# Patient Record
Sex: Male | Born: 1935
Health system: Southern US, Community
[De-identification: ages and names within clinical notes are randomized; demographics above are authoritative.]

## PROBLEM LIST (undated history)

## (undated) ENCOUNTER — Emergency Department: Admission: EM | Discharge status: Absent without leave | Payer: Self-pay

## (undated) DIAGNOSIS — B029 Zoster without complications: Secondary | ICD-10-CM

## (undated) DIAGNOSIS — E785 Hyperlipidemia, unspecified: Secondary | ICD-10-CM

## (undated) DIAGNOSIS — E079 Disorder of thyroid, unspecified: Secondary | ICD-10-CM

## (undated) DIAGNOSIS — C801 Malignant (primary) neoplasm, unspecified: Secondary | ICD-10-CM

## (undated) DIAGNOSIS — I10 Essential (primary) hypertension: Secondary | ICD-10-CM

## (undated) HISTORY — DX: Essential (primary) hypertension: I10

## (undated) HISTORY — DX: Disorder of thyroid, unspecified: E07.9

## (undated) HISTORY — DX: Malignant (primary) neoplasm, unspecified: C80.1

## (undated) HISTORY — DX: Hyperlipidemia, unspecified: E78.5

## (undated) HISTORY — DX: Zoster without complications: B02.9

---

## 1992-12-07 HISTORY — PX: OTHER SURGICAL HISTORY: SHX169

## 2005-01-09 ENCOUNTER — Ambulatory Visit: Payer: Self-pay | Admitting: Gastroenterology

## 2005-07-17 ENCOUNTER — Ambulatory Visit: Payer: Self-pay | Admitting: Gastroenterology

## 2005-08-24 ENCOUNTER — Other Ambulatory Visit: Payer: Self-pay

## 2005-08-24 ENCOUNTER — Ambulatory Visit: Payer: Self-pay | Admitting: General Surgery

## 2005-08-24 HISTORY — PX: OTHER SURGICAL HISTORY: SHX169

## 2005-08-26 ENCOUNTER — Inpatient Hospital Stay: Payer: Self-pay | Admitting: General Surgery

## 2007-09-08 ENCOUNTER — Ambulatory Visit: Payer: Self-pay | Admitting: Internal Medicine

## 2007-09-12 ENCOUNTER — Ambulatory Visit: Payer: Self-pay | Admitting: Internal Medicine

## 2008-10-08 ENCOUNTER — Ambulatory Visit: Payer: Self-pay | Admitting: Unknown Physician Specialty

## 2009-01-25 ENCOUNTER — Ambulatory Visit: Payer: Self-pay | Admitting: Internal Medicine

## 2009-07-15 ENCOUNTER — Ambulatory Visit: Payer: Self-pay | Admitting: Unknown Physician Specialty

## 2009-08-13 ENCOUNTER — Ambulatory Visit: Payer: Self-pay | Admitting: Internal Medicine

## 2013-02-08 ENCOUNTER — Ambulatory Visit: Payer: Self-pay | Admitting: Ophthalmology

## 2013-04-19 ENCOUNTER — Ambulatory Visit: Payer: Self-pay | Admitting: Ophthalmology

## 2014-03-20 DIAGNOSIS — IMO0002 Reserved for concepts with insufficient information to code with codable children: Secondary | ICD-10-CM | POA: Insufficient documentation

## 2014-03-20 HISTORY — DX: Reserved for concepts with insufficient information to code with codable children: IMO0002

## 2014-09-12 DIAGNOSIS — G903 Multi-system degeneration of the autonomic nervous system: Secondary | ICD-10-CM | POA: Insufficient documentation

## 2014-09-12 HISTORY — DX: Multi-system degeneration of the autonomic nervous system: G90.3

## 2015-03-18 LAB — BASIC METABOLIC PANEL
Creatinine: 1.3 mg/dL (ref 0.6–1.3)
POTASSIUM: 3.8 mmol/L (ref 3.4–5.3)

## 2015-03-18 LAB — TSH: TSH: 3.13 u[IU]/mL (ref 0.41–5.90)

## 2015-03-18 LAB — HEPATIC FUNCTION PANEL
ALT: 11 U/L (ref 10–40)
AST: 14 U/L (ref 14–40)

## 2015-09-11 ENCOUNTER — Encounter: Payer: Self-pay | Admitting: Family Medicine

## 2015-09-11 ENCOUNTER — Ambulatory Visit (INDEPENDENT_AMBULATORY_CARE_PROVIDER_SITE_OTHER): Payer: Medicare Other | Admitting: Family Medicine

## 2015-09-11 VITALS — BP 139/67 | HR 72 | Ht 65.0 in | Wt 145.0 lb

## 2015-09-11 DIAGNOSIS — E785 Hyperlipidemia, unspecified: Secondary | ICD-10-CM

## 2015-09-11 DIAGNOSIS — I129 Hypertensive chronic kidney disease with stage 1 through stage 4 chronic kidney disease, or unspecified chronic kidney disease: Secondary | ICD-10-CM

## 2015-09-11 DIAGNOSIS — E039 Hypothyroidism, unspecified: Secondary | ICD-10-CM

## 2015-09-11 DIAGNOSIS — I493 Ventricular premature depolarization: Secondary | ICD-10-CM | POA: Diagnosis not present

## 2015-09-11 DIAGNOSIS — I10 Essential (primary) hypertension: Secondary | ICD-10-CM | POA: Diagnosis not present

## 2015-09-11 DIAGNOSIS — N183 Chronic kidney disease, stage 3 unspecified: Secondary | ICD-10-CM

## 2015-09-11 DIAGNOSIS — G459 Transient cerebral ischemic attack, unspecified: Secondary | ICD-10-CM

## 2015-09-11 DIAGNOSIS — Z8673 Personal history of transient ischemic attack (TIA), and cerebral infarction without residual deficits: Secondary | ICD-10-CM | POA: Insufficient documentation

## 2015-09-11 HISTORY — DX: Hypertensive chronic kidney disease with stage 1 through stage 4 chronic kidney disease, or unspecified chronic kidney disease: I12.9

## 2015-09-11 HISTORY — DX: Personal history of transient ischemic attack (TIA), and cerebral infarction without residual deficits: Z86.73

## 2015-09-11 HISTORY — DX: Ventricular premature depolarization: I49.3

## 2015-09-11 HISTORY — DX: Hypothyroidism, unspecified: E03.9

## 2015-09-11 LAB — BASIC METABOLIC PANEL
BUN: 22 mg/dL (ref 7–25)
CHLORIDE: 99 mmol/L (ref 98–110)
CO2: 26 mmol/L (ref 20–31)
CREATININE: 1.43 mg/dL — AB (ref 0.70–1.11)
Calcium: 9.3 mg/dL (ref 8.6–10.3)
Glucose, Bld: 92 mg/dL (ref 65–99)
Potassium: 4 mmol/L (ref 3.5–5.3)
Sodium: 138 mmol/L (ref 135–146)

## 2015-09-11 LAB — TSH: TSH: 4.595 u[IU]/mL — ABNORMAL HIGH (ref 0.350–4.500)

## 2015-09-11 NOTE — Progress Notes (Signed)
CC: Benjamin Bryan is a 81 y.o. male is here for Establish Care   Subjective: HPI:   very pleasant 81 year old here to establish care  No acute complaints but would like to go over his past medical history and asks for any recommendations.  Has a history of hypothyroidism spanning back decades. He's been taking 50 mg of levothyroxine on a daily basis for at least last 6 months.   In April his TSH was normal, since that time he has had no intentional weight loss or gain. He denies any hair or skin complaints.  History of essential hypertension. This was originally found in 2008. He was started on amlodipine 5 mg at that time and a few months later increased to 5 mg twice a day. He checks his blood pressure on a daily basis and for the past 6 months it's never been above 140/90. He denies any lightheadedness or known side effects.  He has a history of frequent PVCs and has had a stress test which was normal back in 2008.  He tells me that the symptoms no longer bother him. Denies chest pain or any irregular heartbeat.  Review of outside records show that he has a GFR that puts him in stage III chronic kidney disease. It has been stable and actually improved over the past year.  In 2008 he had a transient ischemic attack described as right eyelid droop and impaired speech. It lasted approximate 30 seconds. He had an MRI of the brain and an echocardiogram following this which was reportedly unremarkable and normal. Since then he's been taking aspirin on a daily basis without any new motor or sensory disturbances.  Review of Systems - General ROS: negative for - chills, fever, night sweats, weight gain or weight loss Ophthalmic ROS: negative for - decreased vision Psychological ROS: negative for - anxiety or depression ENT ROS: negative for - hearing change, nasal congestion, tinnitus or allergies Hematological and Lymphatic ROS: negative for - bleeding problems, bruising or swollen lymph  nodes Breast ROS: negative Respiratory ROS: no cough, shortness of breath, or wheezing Cardiovascular ROS: no chest pain or dyspnea on exertion Gastrointestinal ROS: no abdominal pain, change in bowel habits, or black or bloody stools Genito-Urinary ROS: negative for - genital discharge, genital ulcers, incontinence or abnormal bleeding from genitals Musculoskeletal ROS: negative for - joint pain or muscle pain Neurological ROS: negative for - headaches or memory loss Dermatological ROS: negative for lumps, mole changes, rash and skin lesion changes     Past Medical History  Diagnosis Date  . Hypertension   . Hyperlipidemia   . Thyroid disease   . Shingles     Past Surgical History  Procedure Laterality Date  . Radical prostectomy  1994  . Removed section of intestine  08/24/2005    polyp   Family History  Problem Relation Age of Onset  . Prostate cancer Father   . Stroke Father     Social History   Social History  . Marital Status: Married    Spouse Name: N/A  . Number of Children: N/A  . Years of Education: N/A   Occupational History  . Not on file.   Social History Main Topics  . Smoking status: Former Smoker    Quit date: 12/11/1984  . Smokeless tobacco: Not on file  . Alcohol Use: 10.8 - 12.0 oz/week    18-20 Standard drinks or equivalent per week  . Drug Use: No  . Sexual Activity: No  Other Topics Concern  . Not on file   Social History Narrative  . No narrative on file     Objective: BP 139/67 mmHg  Pulse 72  Ht 5\' 5"  (1.651 m)  Wt 145 lb (65.772 kg)  BMI 24.13 kg/m2  General: Alert and Oriented, No Acute Distress HEENT: Pupils equal, round, reactive to light. Conjunctivae clear.  Moist mucous membranes pharynx unremarkable Lungs: Clear to auscultation bilaterally, no wheezing/ronchi/rales.  Comfortable work of breathing. Good air movement. Cardiac: Regular rate and rhythm. Normal S1/S2.  No murmurs, rubs, nor gallops.   Abdomen: Soft and  flat Extremities: No peripheral edema.  Strong peripheral pulses.  Mental Status: No depression, anxiety, nor agitation. Skin: Warm and dry.  Assessment & Plan: Georges was seen today for establish care.  Diagnoses and all orders for this visit:  Hypothyroidism, unspecified hypothyroidism type -     TSH  Essential hypertension -     Basic Metabolic Panel (BMET)  Hyperlipidemia  Frequent PVCs  Hypertensive kidney disease with stage 3 chronic kidney disease  Transient cerebral ischemia, unspecified transient cerebral ischemia type   Hypothyroidism: Clinically controlled due for repeat TSH continue levothyroxine pending results Essential hypertension: Controlled, continue amlodipine, checking renal function given history of stage III chronic kidney disease. Hyperlipidemia: Controlled no need for lipid panel until early next year Frequent PVCs: Asymptomatic no further intervention History of TIA: Focusing on blood pressure and blood sugar control with daily aspirin use.  Return in about 3 months (around 12/12/2015).

## 2015-10-03 ENCOUNTER — Encounter: Payer: Self-pay | Admitting: Family Medicine

## 2015-10-03 DIAGNOSIS — J449 Chronic obstructive pulmonary disease, unspecified: Secondary | ICD-10-CM

## 2015-10-03 DIAGNOSIS — Z8546 Personal history of malignant neoplasm of prostate: Secondary | ICD-10-CM | POA: Insufficient documentation

## 2015-10-03 HISTORY — DX: Chronic obstructive pulmonary disease, unspecified: J44.9

## 2015-10-03 HISTORY — DX: Personal history of malignant neoplasm of prostate: Z85.46

## 2015-10-21 ENCOUNTER — Encounter: Payer: Self-pay | Admitting: Emergency Medicine

## 2015-10-21 LAB — CALCIUM: Calcium: 9 mg/dL

## 2015-12-13 ENCOUNTER — Ambulatory Visit (INDEPENDENT_AMBULATORY_CARE_PROVIDER_SITE_OTHER): Payer: Medicare Other | Admitting: Family Medicine

## 2015-12-13 ENCOUNTER — Encounter: Payer: Self-pay | Admitting: Family Medicine

## 2015-12-13 VITALS — BP 148/82 | HR 86 | Wt 152.0 lb

## 2015-12-13 DIAGNOSIS — E039 Hypothyroidism, unspecified: Secondary | ICD-10-CM

## 2015-12-13 DIAGNOSIS — I10 Essential (primary) hypertension: Secondary | ICD-10-CM | POA: Diagnosis not present

## 2015-12-13 DIAGNOSIS — E785 Hyperlipidemia, unspecified: Secondary | ICD-10-CM | POA: Diagnosis not present

## 2015-12-13 MED ORDER — AMLODIPINE BESYLATE 5 MG PO TABS: 5.0000 mg | ORAL_TABLET | Freq: Two times a day (BID) | ORAL | Status: DC

## 2015-12-13 MED ORDER — LEVOTHYROXINE SODIUM 50 MCG PO TABS: ORAL_TABLET | ORAL | Status: DC

## 2015-12-13 MED ORDER — ATORVASTATIN CALCIUM 10 MG PO TABS: 10.0000 mg | ORAL_TABLET | Freq: Every day | ORAL | Status: DC

## 2015-12-13 NOTE — Progress Notes (Signed)
CC: Benjamin Bryan is a 82 y.o. male is here for Follow-up   Subjective: HPI:  Follow-up essential hypertension: Continues daily amlodipine 5 mg twice a day. He brings in blood pressures from home reflecting 99% of the time being in the normotensive range. He denies chest pain shortness of breath orthopnea.  Follow-up hyperlipidemia: He is requesting a refill on atorvastatin. He walks every day for about 20-30 minutes. He tries to watch what he eats as well. He denies any limb claudication right or quadrant pain or myalgias.  Follow-up hypothyroidism: He denies any unintentional weight loss or gain. He is taking levothyroxine with 100% compliance. He denies any skin or hair complaints. No constipation or diarrhea.  His only complaint today is swelling in the right ankle. It's absent in the morning and only noticed in the afternoons. It goes away in a few minutes after he raises his foot and does not come back for the rest of the day. It's nonpainful and mild in severity. He denies any overlying skin changes. Denies edema elsewhere.he thinks is been going on for years   Review Of Systems Outlined In HPI  Past Medical History  Diagnosis Date  . Hypertension   . Hyperlipidemia   . Thyroid disease   . Shingles     Past Surgical History  Procedure Laterality Date  . Radical prostectomy  1994  . Removed section of intestine  08/24/2005    polyp   Family History  Problem Relation Age of Onset  . Prostate cancer Father   . Stroke Father     Social History   Social History  . Marital Status: Married    Spouse Name: N/A  . Number of Children: N/A  . Years of Education: N/A   Occupational History  . Not on file.   Social History Main Topics  . Smoking status: Former Smoker    Quit date: 12/11/1984  . Smokeless tobacco: Not on file  . Alcohol Use: 10.8 - 12.0 oz/week    18-20 Standard drinks or equivalent per week  . Drug Use: No  . Sexual Activity: No   Other Topics  Concern  . Not on file   Social History Narrative     Objective: BP 148/82 mmHg  Pulse 86  Wt 152 lb (68.947 kg)  General: Alert and Oriented, No Acute Distress HEENT: Pupils equal, round, reactive to light. Conjunctivae clear.  Moist mucous membranes Lungs: Clear to auscultation bilaterally, no wheezing/ronchi/rales.  Comfortable work of breathing. Good air movement. Cardiac: Regular rate and rhythm. Normal S1/S2.  No murmurs, rubs, nor gallops.   Extremities: No peripheral edema.  Strong peripheral pulses.  Mental Status: No depression, anxiety, nor agitation. Skin: Warm and dry.  Assessment & Plan: Devaj was seen today for follow-up.  Diagnoses and all orders for this visit:  Essential hypertension  Hypothyroidism, unspecified hypothyroidism type  Hyperlipidemia  Other orders -     amLODipine (NORVASC) 5 MG tablet; Take 1 tablet (5 mg total) by mouth 2 (two) times daily. Take two tablets per day -     atorvastatin (LIPITOR) 10 MG tablet; Take 1 tablet (10 mg total) by mouth daily. -     Cancel: Glucosamine-Chondroitin 1500-1200 MG/30ML LIQD; Take by mouth. Take two tablets per day -     levothyroxine (SYNTHROID, LEVOTHROID) 50 MCG tablet; take 1 tablet by mouth once daily for THYROID   This is a hypertension: Will be considered controlled given his normotensive readings at home. Continue amlodipine  twice a day. Hypothyroidism: Clinically controlled to for repeat TSH in 3 months continue levothyroxine  for now Hyperlipidemia: clinical controlled to for annual lipid panel, he has already eaten today so he has agreed to come back in the future when fasting  Reassurance provided that his swelling is due to mild venous insufficiency and can be followed clinically with no further intervention at this time.  Return in about 3 months (around 03/12/2016).

## 2016-03-12 ENCOUNTER — Ambulatory Visit (INDEPENDENT_AMBULATORY_CARE_PROVIDER_SITE_OTHER): Payer: Medicare Other | Admitting: Family Medicine

## 2016-03-12 ENCOUNTER — Encounter: Payer: Self-pay | Admitting: Family Medicine

## 2016-03-12 VITALS — BP 167/75 | HR 82 | Wt 153.0 lb

## 2016-03-12 DIAGNOSIS — I10 Essential (primary) hypertension: Secondary | ICD-10-CM | POA: Diagnosis not present

## 2016-03-12 DIAGNOSIS — Z8546 Personal history of malignant neoplasm of prostate: Secondary | ICD-10-CM

## 2016-03-12 DIAGNOSIS — E039 Hypothyroidism, unspecified: Secondary | ICD-10-CM

## 2016-03-12 DIAGNOSIS — E785 Hyperlipidemia, unspecified: Secondary | ICD-10-CM

## 2016-03-12 LAB — LIPID PANEL
CHOLESTEROL: 147 mg/dL (ref 125–200)
HDL: 66 mg/dL (ref >=40)
LDL Cholesterol: 67 mg/dL (ref <130)
Total CHOL/HDL Ratio: 2.2 Ratio (ref <=5.0)
Triglycerides: 70 mg/dL (ref <150)
VLDL: 14 mg/dL (ref <30)

## 2016-03-12 LAB — TSH: TSH: 4.45 m[IU]/L (ref 0.40–4.50)

## 2016-03-12 NOTE — Progress Notes (Signed)
CC: Benjamin Bryan is a 82 y.o. male is here for Hypertension   Subjective: HPI:  Follow-up essential hypertension: Taking and limping twice a day with no known side effects. He denies any chest pain shortness of breath orthopnea or peripheral edema. He stays active doing yard work on nice days and coughing in his free time.  Follow-up hypothyroidism: Currently taking 50 MCG's of levothyroxine on a daily basis. No unintentional weight loss or gain or gastrointestinal complaints.  Follow-up hyperlipidemia: He is taking atorvastatin on a daily basis with no recent motor or sensory disturbances nor myalgias.  He has a history of prostate cancer and it was treated with prostatectomy he wants know if radiation would've been a better option.   Review Of Systems Outlined In HPI  Past Medical History  Diagnosis Date  . Hypertension   . Hyperlipidemia   . Thyroid disease   . Shingles     Past Surgical History  Procedure Laterality Date  . Radical prostectomy  1994  . Removed section of intestine  08/24/2005    polyp   Family History  Problem Relation Age of Onset  . Prostate cancer Father   . Stroke Father     Social History   Social History  . Marital Status: Married    Spouse Name: N/A  . Number of Children: N/A  . Years of Education: N/A   Occupational History  . Not on file.   Social History Main Topics  . Smoking status: Former Smoker    Quit date: 12/11/1984  . Smokeless tobacco: Not on file  . Alcohol Use: 10.8 - 12.0 oz/week    18-20 Standard drinks or equivalent per week  . Drug Use: No  . Sexual Activity: No   Other Topics Concern  . Not on file   Social History Narrative     Objective: BP 167/75 mmHg  Pulse 82  Wt 153 lb (69.4 kg)  General: Alert and Oriented, No Acute Distress HEENT: Pupils equal, round, reactive to light. Conjunctivae clear.  Moist mucous membranes Lungs: Clear to auscultation bilaterally, no wheezing/ronchi/rales.  Comfortable  work of breathing. Good air movement. Cardiac: Regular rate and rhythm. Normal S1/S2.  No murmurs, rubs, nor gallops.   Extremities: No peripheral edema.  Strong peripheral pulses.  Mental Status: No depression, anxiety, nor agitation. Skin: Warm and dry.  Assessment & Plan: Christa was seen today for hypertension.  Diagnoses and all orders for this visit:  Essential hypertension  Hypothyroidism, unspecified hypothyroidism type -     TSH  Hyperlipidemia -     Lipid panel  History of prostate cancer   Essential hypertension: Continue to treat as white coat hypertension continue Amlodipine  hypothyroidism: Due for TSH continue levothyroxine pending results Hyperlipidemia: Clinical control due for repeat lipid panel continue atorvastatin pending results  prostate cancer: In remission, discuss it is hard to say if radiation therapy would've been a better option than surgery but he should look at this from an optimistic viewpoint that if he sitting here alive today he made the right decision.   Return in about 3 months (around 06/11/2016).

## 2016-03-13 ENCOUNTER — Telehealth: Payer: Self-pay | Admitting: Family Medicine

## 2016-03-13 MED ORDER — ATORVASTATIN CALCIUM 10 MG PO TABS: 10.0000 mg | ORAL_TABLET | Freq: Every day | ORAL | Status: DC

## 2016-03-13 MED ORDER — AMLODIPINE BESYLATE 5 MG PO TABS: 5.0000 mg | ORAL_TABLET | Freq: Two times a day (BID) | ORAL | Status: DC

## 2016-03-13 MED ORDER — LEVOTHYROXINE SODIUM 50 MCG PO TABS: ORAL_TABLET | ORAL | Status: DC

## 2016-03-13 NOTE — Telephone Encounter (Signed)
Please let patient know that his thyroid supplement appears to be adequately dosed and his cholesterol remains under control so I've sent refills for all of his medications to [place] [facility] [place]

## 2016-03-13 NOTE — Telephone Encounter (Signed)
Pt.notified

## 2016-03-23 DIAGNOSIS — Z08 Encounter for follow-up examination after completed treatment for malignant neoplasm: Secondary | ICD-10-CM | POA: Diagnosis not present

## 2016-03-23 DIAGNOSIS — C44519 Basal cell carcinoma of skin of other part of trunk: Secondary | ICD-10-CM | POA: Diagnosis not present

## 2016-03-23 DIAGNOSIS — L821 Other seborrheic keratosis: Secondary | ICD-10-CM | POA: Diagnosis not present

## 2016-03-23 DIAGNOSIS — Z85828 Personal history of other malignant neoplasm of skin: Secondary | ICD-10-CM | POA: Diagnosis not present

## 2016-03-23 DIAGNOSIS — L570 Actinic keratosis: Secondary | ICD-10-CM | POA: Diagnosis not present

## 2016-09-16 ENCOUNTER — Encounter: Payer: Self-pay | Admitting: Family Medicine

## 2016-09-16 ENCOUNTER — Ambulatory Visit (INDEPENDENT_AMBULATORY_CARE_PROVIDER_SITE_OTHER): Payer: Medicare Other | Admitting: Family Medicine

## 2016-09-16 VITALS — BP 159/59 | HR 84 | Ht 65.0 in | Wt 151.0 lb

## 2016-09-16 DIAGNOSIS — Z23 Encounter for immunization: Secondary | ICD-10-CM | POA: Diagnosis not present

## 2016-09-16 DIAGNOSIS — N183 Chronic kidney disease, stage 3 unspecified: Secondary | ICD-10-CM

## 2016-09-16 DIAGNOSIS — E039 Hypothyroidism, unspecified: Secondary | ICD-10-CM | POA: Diagnosis not present

## 2016-09-16 DIAGNOSIS — J449 Chronic obstructive pulmonary disease, unspecified: Secondary | ICD-10-CM | POA: Diagnosis not present

## 2016-09-16 DIAGNOSIS — I129 Hypertensive chronic kidney disease with stage 1 through stage 4 chronic kidney disease, or unspecified chronic kidney disease: Secondary | ICD-10-CM | POA: Diagnosis not present

## 2016-09-16 DIAGNOSIS — I10 Essential (primary) hypertension: Secondary | ICD-10-CM | POA: Diagnosis not present

## 2016-09-16 NOTE — Patient Instructions (Signed)
Thank you for coming in today. Get labs soon.  Return for recheck in 6 months.

## 2016-09-16 NOTE — Progress Notes (Signed)
Benjamin Bryan is a 82 y.o. male who presents to [facility] today for establish care. Patient was previously seen by a Dr. this clinic who has since left.  He is pretty well with the below medications and problems. His only complaint today is occasional dizziness when he stands from a seated position. This is been ongoing for years and has been previously worked up. He's been recommended to use compression stockings which he does and he notes this helps. He denies any fevers or chills vomiting or diarrhea. He notes is been sometime since his labs were last checked.   Past Medical History:  Diagnosis Date  . Hyperlipidemia   . Hypertension   . Shingles   . Thyroid disease    Past Surgical History:  Procedure Laterality Date  . radical prostectomy  1994  . removed section of intestine  08/24/2005   polyp   Social History  Substance Use Topics  . Smoking status: Former Smoker    Quit date: 12/11/1984  . Smokeless tobacco: Not on file  . Alcohol use 10.8 - 12.0 oz/week    18 - 20 Standard drinks or equivalent per week   family history includes Prostate cancer in his father; Stroke in his father.  ROS as above:  Medications: Current Outpatient Prescriptions  Medication Sig Dispense Refill  . amLODipine (NORVASC) 5 MG tablet Take 1 tablet (5 mg total) by mouth 2 (two) times daily. Take two tablets per day 180 tablet 1  . aspirin 81 MG tablet Take 81 mg by mouth daily.    Marland Kitchen atorvastatin (LIPITOR) 10 MG tablet Take 1 tablet (10 mg total) by mouth daily. 90 tablet 1  . famotidine (PEPCID) 20 MG tablet Take 20 mg by mouth daily.    . Glucosamine-Chondroitin 1500-1200 MG/30ML LIQD Take by mouth. Take two tablets per day    . levothyroxine (SYNTHROID, LEVOTHROID) 50 MCG tablet take 1 tablet by mouth once daily for THYROID 90 tablet 1   No current  facility-administered medications for this visit.    No Known Allergies   Exam:  BP (!) 159/59   Pulse 84   Ht 5\' 5"  (1.651 m)   Wt 151 lb (68.5 kg)   BMI 25.13 kg/m  Gen: Well NAD HEENT: EOMI,  MMM Lungs: Normal work of breathing. CTABL Heart: RRR no MRG Abd: NABS, Soft. Nondistended, Nontender Exts: Brisk capillary refill, warm and well perfused.  No significant edema present.   No results found for this or any previous visit (from the past 24 hour(s)). No results found.    Assessment and Plan: 82 y.o. male with orthostatic dizziness: Wednesday dizziness is the most likely explanation of his mild dizziness upon standing. This is probably a result of his underlying medical problems plus the small amount of blood pressure medication that he does take. The fundamental problem is he is hypertensive however I think as a bit of orthostasis. Things seem to be stable and not changing over the last several years therefore I think it's reasonable just to continue doing and recheck labs. Readdress in the near future.  Hypothyroidism: Recheck TSH  COPD: Watchful waiting continue current plan.   Orders Placed This Encounter  Procedures  . Pneumococcal conjugate vaccine 13-valent IM  . CBC  . COMPLETE METABOLIC PANEL WITH GFR  . Lipid panel  . TSH    Discussed warning signs or symptoms. Please see discharge instructions. Patient expresses  understanding.

## 2016-09-17 LAB — CBC
HCT: 41.4 % (ref 38.5–50.0)
Hemoglobin: 14.3 g/dL (ref 13.2–17.1)
MCH: 32.3 pg (ref 27.0–33.0)
MCHC: 34.5 g/dL (ref 32.0–36.0)
MCV: 93.5 fL (ref 80.0–100.0)
MPV: 9.7 fL (ref 7.5–12.5)
PLATELETS: 289 10*3/uL (ref 140–400)
RBC: 4.43 MIL/uL (ref 4.20–5.80)
RDW: 13.2 % (ref 11.0–15.0)
WBC: 7.3 10*3/uL (ref 3.8–10.8)

## 2016-09-17 LAB — LIPID PANEL
Cholesterol: 143 mg/dL (ref 125–200)
HDL: 56 mg/dL (ref >=40)
LDL CALC: 69 mg/dL (ref <130)
Total CHOL/HDL Ratio: 2.6 Ratio (ref <=5.0)
Triglycerides: 91 mg/dL (ref <150)
VLDL: 18 mg/dL (ref <30)

## 2016-09-17 LAB — COMPLETE METABOLIC PANEL WITH GFR
ALT: 13 U/L (ref 9–46)
AST: 16 U/L (ref 10–35)
Albumin: 3.9 g/dL (ref 3.6–5.1)
Alkaline Phosphatase: 60 U/L (ref 40–115)
BILIRUBIN TOTAL: 0.8 mg/dL (ref 0.2–1.2)
BUN: 15 mg/dL (ref 7–25)
CHLORIDE: 104 mmol/L (ref 98–110)
CO2: 26 mmol/L (ref 20–31)
CREATININE: 1.32 mg/dL — AB (ref 0.70–1.11)
Calcium: 9.2 mg/dL (ref 8.6–10.3)
GFR, Est African American: 56 mL/min — ABNORMAL LOW (ref >=60)
GFR, Est Non African American: 49 mL/min — ABNORMAL LOW (ref >=60)
GLUCOSE: 99 mg/dL (ref 65–99)
Potassium: 4 mmol/L (ref 3.5–5.3)
SODIUM: 140 mmol/L (ref 135–146)
TOTAL PROTEIN: 6.5 g/dL (ref 6.1–8.1)

## 2016-09-17 LAB — TSH: TSH: 3.26 m[IU]/L (ref 0.40–4.50)

## 2016-09-26 DIAGNOSIS — H02022 Mechanical entropion of right lower eyelid: Secondary | ICD-10-CM | POA: Diagnosis not present

## 2016-09-28 ENCOUNTER — Other Ambulatory Visit: Payer: Self-pay

## 2016-09-28 DIAGNOSIS — Z85828 Personal history of other malignant neoplasm of skin: Secondary | ICD-10-CM | POA: Diagnosis not present

## 2016-09-28 DIAGNOSIS — L570 Actinic keratosis: Secondary | ICD-10-CM | POA: Diagnosis not present

## 2016-09-28 DIAGNOSIS — L821 Other seborrheic keratosis: Secondary | ICD-10-CM | POA: Diagnosis not present

## 2016-09-28 DIAGNOSIS — Z08 Encounter for follow-up examination after completed treatment for malignant neoplasm: Secondary | ICD-10-CM | POA: Diagnosis not present

## 2016-09-28 DIAGNOSIS — D1801 Hemangioma of skin and subcutaneous tissue: Secondary | ICD-10-CM | POA: Diagnosis not present

## 2016-09-28 MED ORDER — LEVOTHYROXINE SODIUM 50 MCG PO TABS: ORAL_TABLET | ORAL | 1 refills | Status: DC

## 2016-09-28 MED ORDER — ATORVASTATIN CALCIUM 10 MG PO TABS: 10.0000 mg | ORAL_TABLET | Freq: Every day | ORAL | 1 refills | Status: DC

## 2016-09-28 MED ORDER — AMLODIPINE BESYLATE 5 MG PO TABS: 5.0000 mg | ORAL_TABLET | Freq: Two times a day (BID) | ORAL | 1 refills | Status: DC

## 2016-10-22 ENCOUNTER — Encounter: Payer: Self-pay | Admitting: Family Medicine

## 2017-03-22 ENCOUNTER — Encounter: Payer: Self-pay | Admitting: *Deleted

## 2017-03-22 ENCOUNTER — Ambulatory Visit: Payer: Self-pay | Admitting: Family Medicine

## 2017-03-22 ENCOUNTER — Encounter: Payer: Self-pay | Admitting: Family Medicine

## 2017-03-22 ENCOUNTER — Ambulatory Visit (INDEPENDENT_AMBULATORY_CARE_PROVIDER_SITE_OTHER): Payer: Medicare Other | Admitting: Family Medicine

## 2017-03-22 VITALS — BP 149/58 | HR 77 | Wt 153.0 lb

## 2017-03-22 DIAGNOSIS — E782 Mixed hyperlipidemia: Secondary | ICD-10-CM

## 2017-03-22 DIAGNOSIS — E039 Hypothyroidism, unspecified: Secondary | ICD-10-CM | POA: Diagnosis not present

## 2017-03-22 DIAGNOSIS — M79606 Pain in leg, unspecified: Secondary | ICD-10-CM | POA: Insufficient documentation

## 2017-03-22 DIAGNOSIS — I10 Essential (primary) hypertension: Secondary | ICD-10-CM

## 2017-03-22 DIAGNOSIS — M79604 Pain in right leg: Secondary | ICD-10-CM | POA: Diagnosis not present

## 2017-03-22 LAB — CBC
HCT: 43.5 % (ref 38.5–50.0)
Hemoglobin: 14.4 g/dL (ref 13.2–17.1)
MCH: 31.4 pg (ref 27.0–33.0)
MCHC: 33.1 g/dL (ref 32.0–36.0)
MCV: 95 fL (ref 80.0–100.0)
MPV: 10.3 fL (ref 7.5–12.5)
PLATELETS: 325 10*3/uL (ref 140–400)
RBC: 4.58 MIL/uL (ref 4.20–5.80)
RDW: 13.1 % (ref 11.0–15.0)
WBC: 6.8 10*3/uL (ref 3.8–10.8)

## 2017-03-22 LAB — TSH: TSH: 5.08 mIU/L — ABNORMAL HIGH (ref 0.40–4.50)

## 2017-03-22 LAB — COMPLETE METABOLIC PANEL WITH GFR
ALT: 16 U/L (ref 9–46)
AST: 17 U/L (ref 10–35)
Albumin: 4.3 g/dL (ref 3.6–5.1)
Alkaline Phosphatase: 58 U/L (ref 40–115)
BILIRUBIN TOTAL: 0.7 mg/dL (ref 0.2–1.2)
BUN: 13 mg/dL (ref 7–25)
CO2: 25 mmol/L (ref 20–31)
CREATININE: 1.35 mg/dL — AB (ref 0.70–1.11)
Calcium: 9.4 mg/dL (ref 8.6–10.3)
Chloride: 102 mmol/L (ref 98–110)
GFR, EST NON AFRICAN AMERICAN: 47 mL/min — AB (ref >=60)
GFR, Est African American: 55 mL/min — ABNORMAL LOW (ref >=60)
GLUCOSE: 125 mg/dL — AB (ref 65–99)
Potassium: 4.2 mmol/L (ref 3.5–5.3)
SODIUM: 139 mmol/L (ref 135–146)
TOTAL PROTEIN: 7.1 g/dL (ref 6.1–8.1)

## 2017-03-22 MED ORDER — LEVOTHYROXINE SODIUM 50 MCG PO TABS: ORAL_TABLET | ORAL | 1 refills | Status: DC

## 2017-03-22 MED ORDER — AMLODIPINE BESYLATE 5 MG PO TABS: 5.0000 mg | ORAL_TABLET | Freq: Two times a day (BID) | ORAL | 1 refills | Status: DC

## 2017-03-22 MED ORDER — TETANUS-DIPHTH-ACELL PERTUSSIS 5-2.5-18.5 LF-MCG/0.5 IM SUSP: 0.5000 mL | Freq: Once | INTRAMUSCULAR | 0 refills | Status: AC

## 2017-03-22 MED ORDER — ATORVASTATIN CALCIUM 10 MG PO TABS: 10.0000 mg | ORAL_TABLET | Freq: Every day | ORAL | 1 refills | Status: DC

## 2017-03-22 NOTE — Progress Notes (Signed)
Benjamin Bryan is a 83 y.o. male who presents to [facility] today for follow-up hypertension hyperlipidemia and hypothyroidism.  Hypertension: Patient is doing well with amlodipine for blood pressure. He's been complaining of orthostatic hypotension dizziness for years. He's had an extensive workup previously. He notes that he never with the current regimen feels as though he is going to pass out but he does feel lightheaded and dizzy when he stands up suddenly.  He does however note some right leg aching pain. He notes this is worse with exertion and better with rest. He denies any creepy crawly feeling at bed. He denies fevers or chills.  Hypothyroidism: Patient takes 50 g of levothyroxine daily and feels well. He does not feel too hot or too cold.  Past Medical History:  Diagnosis Date  . Hyperlipidemia   . Hypertension   . Shingles   . Thyroid disease    Past Surgical History:  Procedure Laterality Date  . radical prostectomy  1994  . removed section of intestine  08/24/2005   polyp   Social History  Substance Use Topics  . Smoking status: Former Smoker    Quit date: 12/11/1984  . Smokeless tobacco: Never Used  . Alcohol use 10.8 - 12.0 oz/week    18 - 20 Standard drinks or equivalent per week   family history includes Prostate cancer in his father; Stroke in his father.  ROS as above:  Medications: Current Outpatient Prescriptions  Medication Sig Dispense Refill  . amLODipine (NORVASC) 5 MG tablet Take 1 tablet (5 mg total) by mouth 2 (two) times daily. Take two tablets per day 180 tablet 1  . aspirin 81 MG tablet Take 81 mg by mouth daily.    Marland Kitchen atorvastatin (LIPITOR) 10 MG tablet Take 1 tablet (10 mg total) by mouth daily. 90 tablet 1  . famotidine (PEPCID) 20 MG tablet Take 20 mg by mouth daily.    . Glucosamine-Chondroitin 1500-1200 MG/30ML LIQD  Take by mouth. Take two tablets per day    . levothyroxine (SYNTHROID, LEVOTHROID) 50 MCG tablet take 1 tablet by mouth once daily for THYROID 90 tablet 1  . Tdap (BOOSTRIX) 5-2.5-18.5 LF-MCG/0.5 injection Inject 0.5 mLs into the muscle once. 0.5 mL 0   No current facility-administered medications for this visit.    No Known Allergies  Health Maintenance Health Maintenance  Topic Date Due  . TETANUS/TDAP  12/02/1947  . INFLUENZA VACCINE  07/07/2017  . PNA vac Low Risk Adult (2 of 2 - PPSV23) 09/16/2017     Exam:  BP (!) 149/58   Pulse 77   Wt 153 lb (69.4 kg)   BMI 25.46 kg/m  Gen: Well NAD HEENT: EOMI,  MMM Lungs: Normal work of breathing. CTABL Heart: RRR no MRG Abd: NABS, Soft. Nondistended, Nontender Exts: Brisk capillary refill, warm and well perfused.  No edema right leg  No results found for this or any previous visit (from the past 72 hour(s)). No results found.    Assessment and Plan: 83 y.o. male with  Hypertension: I think patient is reasonably well controlled. I'm concerned that if we drive his blood pressure lower he will become more orthostatic. Continue current regimen and check metabolic panel.  Hypothyroidism: Continue current regimen. Check TSH.  Right leg pain: Concerning for claudication. ABI ordered.   Orders Placed This Encounter  Procedures  . CBC  . COMPLETE METABOLIC PANEL WITH GFR  . TSH  Meds ordered this encounter  Medications  . atorvastatin (LIPITOR) 10 MG tablet    Sig: Take 1 tablet (10 mg total) by mouth daily.    Dispense:  90 tablet    Refill:  1  . levothyroxine (SYNTHROID, LEVOTHROID) 50 MCG tablet    Sig: take 1 tablet by mouth once daily for THYROID    Dispense:  90 tablet    Refill:  1  . amLODipine (NORVASC) 5 MG tablet    Sig: Take 1 tablet (5 mg total) by mouth 2 (two) times daily. Take two tablets per day    Dispense:  180 tablet    Refill:  1  . Tdap (BOOSTRIX) 5-2.5-18.5 LF-MCG/0.5 injection    Sig: Inject  0.5 mLs into the muscle once.    Dispense:  0.5 mL    Refill:  0    Send fax report to [phone]     Discussed warning signs or symptoms. Please see discharge instructions. Patient expresses understanding.

## 2017-03-22 NOTE — Patient Instructions (Signed)
Thank you for coming in today. I think it is OK to play golf.  Take is easy.  Return for recheck in 6 months.   You should hear from the vascular lab to check the blood flow to your legs soon.  Let me know if you do not hear from them.    Peripheral Vascular Disease Peripheral vascular disease (PVD) is a disease of the blood vessels that are not part of your heart and brain. A simple term for PVD is poor circulation. In most cases, PVD narrows the blood vessels that carry blood from your heart to the rest of your body. This can result in a decreased supply of blood to your arms, legs, and internal organs, like your stomach or kidneys. However, it most often affects a person's lower legs and feet. There are two types of PVD.  Organic PVD. This is the more common type. It is caused by damage to the structure of blood vessels.  Functional PVD. This is caused by conditions that make blood vessels contract and tighten (spasm). Without treatment, PVD tends to get worse over time. PVD can also lead to acute ischemic limb. This is when an arm or limb suddenly has trouble getting enough blood. This is a medical emergency. Follow these instructions at home:  Take medicines only as told by your doctor.  Do not use any tobacco products, including cigarettes, chewing tobacco, or electronic cigarettes. If you need help quitting, ask your doctor.  Lose weight if you are overweight, and maintain a healthy weight as told by your doctor.  Eat a diet that is low in fat and cholesterol. If you need help, ask your doctor.  Exercise regularly. Ask your doctor for some good activities for you.  Take good care of your feet.  Wear comfortable shoes that fit well.  Check your feet often for any cuts or sores. Contact a doctor if:  You have cramps in your legs while walking.  You have leg pain when you are at rest.  You have coldness in a leg or foot.  Your skin changes.  You are unable to get or  have an erection (erectile dysfunction).  You have cuts or sores on your feet that are not healing. Get help right away if:  Your arm or leg turns cold and blue.  Your arms or legs become red, warm, swollen, painful, or numb.  You have chest pain or trouble breathing.  You suddenly have weakness in your face, arm, or leg.  You become very confused or you cannot speak.  You suddenly have a very bad headache.  You suddenly cannot see. This information is not intended to replace advice given to you by your health care provider. Make sure you discuss any questions you have with your health care provider. Document Released: 02/17/2010 Document Revised: 04/30/2016 Document Reviewed: 05/03/2014 Elsevier Interactive Patient Education  2017 [employer].

## 2017-04-09 ENCOUNTER — Other Ambulatory Visit: Payer: Self-pay | Admitting: Family Medicine

## 2017-04-09 DIAGNOSIS — M79604 Pain in right leg: Secondary | ICD-10-CM

## 2017-04-09 DIAGNOSIS — I739 Peripheral vascular disease, unspecified: Secondary | ICD-10-CM

## 2017-04-15 ENCOUNTER — Ambulatory Visit (HOSPITAL_COMMUNITY)
Admission: RE | Admit: 2017-04-15 | Discharge: 2017-04-15 | Disposition: A | Discharge status: Home / Self Care | Payer: Medicare Other | Source: Ambulatory Visit | Attending: Cardiovascular Disease | Admitting: Cardiovascular Disease

## 2017-04-15 DIAGNOSIS — M79604 Pain in right leg: Secondary | ICD-10-CM | POA: Diagnosis not present

## 2017-04-15 DIAGNOSIS — I739 Peripheral vascular disease, unspecified: Secondary | ICD-10-CM | POA: Insufficient documentation

## 2017-06-23 ENCOUNTER — Ambulatory Visit (INDEPENDENT_AMBULATORY_CARE_PROVIDER_SITE_OTHER): Payer: Medicare Other | Admitting: Family Medicine

## 2017-06-23 ENCOUNTER — Encounter: Payer: Self-pay | Admitting: Family Medicine

## 2017-06-23 VITALS — BP 171/71 | HR 80 | Ht 65.0 in | Wt 151.0 lb

## 2017-06-23 DIAGNOSIS — N183 Chronic kidney disease, stage 3 (moderate): Secondary | ICD-10-CM | POA: Diagnosis not present

## 2017-06-23 DIAGNOSIS — E039 Hypothyroidism, unspecified: Secondary | ICD-10-CM

## 2017-06-23 DIAGNOSIS — I10 Essential (primary) hypertension: Secondary | ICD-10-CM | POA: Diagnosis not present

## 2017-06-23 DIAGNOSIS — I129 Hypertensive chronic kidney disease with stage 1 through stage 4 chronic kidney disease, or unspecified chronic kidney disease: Secondary | ICD-10-CM | POA: Diagnosis not present

## 2017-06-23 DIAGNOSIS — R739 Hyperglycemia, unspecified: Secondary | ICD-10-CM

## 2017-06-23 NOTE — Patient Instructions (Addendum)
Thank you for coming in today. Get labs today.   We will get results back to you in the near future.   Return for a Nurse visit blood pressure check in about 1 month.  Try to keep a blood pressure log.

## 2017-06-23 NOTE — Progress Notes (Signed)
Benjamin Bryan is a 83 y.o. male who presents to [facility]: [facility] today for follow-up thyroid and blood pressure.  Hypothyroid: Patient takes levothyroxine 50 mg daily. He denies feeling too hot or too cold chest pain or palpitations. 3 months ago his labs were checked and he was found to have slightly elevated TSH. He feels well.  Hypertension: Patient takes amlodipine 5 mg twice daily. He feels well with this regimen and denies chest pain palpitations or shortness of breath. He has typically his blood pressures in the 140s or 150s. He denies any lightheadedness or dizziness.   Past Medical History:  Diagnosis Date  . Hyperlipidemia   . Hypertension   . Shingles   . Thyroid disease    Past Surgical History:  Procedure Laterality Date  . radical prostectomy  1994  . removed section of intestine  08/24/2005   polyp   Social History  Substance Use Topics  . Smoking status: Former Smoker    Quit date: 12/11/1984  . Smokeless tobacco: Never Used  . Alcohol use 10.8 - 12.0 oz/week    18 - 20 Standard drinks or equivalent per week   family history includes Prostate cancer in his father; Stroke in his father.  ROS as above:  Medications: Current Outpatient Prescriptions  Medication Sig Dispense Refill  . amLODipine (NORVASC) 5 MG tablet Take 1 tablet (5 mg total) by mouth 2 (two) times daily. Take two tablets per day 180 tablet 1  . aspirin 81 MG tablet Take 81 mg by mouth daily.    Marland Kitchen atorvastatin (LIPITOR) 10 MG tablet Take 1 tablet (10 mg total) by mouth daily. 90 tablet 1  . famotidine (PEPCID) 20 MG tablet Take 20 mg by mouth daily.    . Glucosamine-Chondroitin 1500-1200 MG/30ML LIQD Take by mouth. Take two tablets per day    . levothyroxine (SYNTHROID, LEVOTHROID) 50 MCG tablet take 1 tablet by mouth once daily for THYROID 90 tablet 1   No current  facility-administered medications for this visit.    No Known Allergies  Health Maintenance Health Maintenance  Topic Date Due  . TETANUS/TDAP  12/02/1947  . INFLUENZA VACCINE  07/07/2017  . PNA vac Low Risk Adult (2 of 2 - PPSV23) 09/16/2017     Exam:  BP (!) 171/71   Pulse 80   Ht 5\' 5"  (1.651 m)   Wt 151 lb (68.5 kg)   BMI 25.13 kg/m  Gen: Well NAD HEENT: EOMI,  MMM Lungs: Normal work of breathing. CTABL Heart: RRR no MRG Abd: NABS, Soft. Nondistended, Nontender Exts: Brisk capillary refill, warm and well perfused.   Lab Results  Component Value Date   TSH 5.08 (H) 03/22/2017     Chemistry      Component Value Date/Time   NA 139 03/22/2017 1107   K 4.2 03/22/2017 1107   CL 102 03/22/2017 1107   CO2 25 03/22/2017 1107   BUN 13 03/22/2017 1107   CREATININE 1.35 (H) 03/22/2017 1107      Component Value Date/Time   CALCIUM 9.4 03/22/2017 1107   CALCIUM 9.0 03/18/2015   ALKPHOS 58 03/22/2017 1107   AST 17 03/22/2017 1107   ALT 16 03/22/2017 1107   BILITOT 0.7 03/22/2017 1107       No results found for this or any previous visit (from the past 72 hour(s)). No results found.    Assessment and Plan: 83 y.o. male with  Hypothyroid: Mildly elevated TSH at last check. Patient is doing well symptomatically. Recheck TSH free T4 and free T3.  Hypertension: Blood pressure is elevated today even on recheck. We'll recheck this with the nurse visit in 1 month. Patient has CKD. Check labs as well.  Hyperglycemia: On recent check patient had mildly elevated blood glucose. We'll check an A1c today as well.   Orders Placed This Encounter  Procedures  . COMPLETE METABOLIC PANEL WITH GFR  . TSH  . T4, free  . T3, free  . Hemoglobin A1c   No orders of the defined types were placed in this encounter.    Discussed warning signs or symptoms. Please see discharge instructions. Patient expresses understanding.

## 2017-06-24 LAB — COMPLETE METABOLIC PANEL WITH GFR
ALT: 13 U/L (ref 9–46)
AST: 15 U/L (ref 10–35)
Albumin: 4.2 g/dL (ref 3.6–5.1)
Alkaline Phosphatase: 60 U/L (ref 40–115)
BILIRUBIN TOTAL: 0.8 mg/dL (ref 0.2–1.2)
BUN: 20 mg/dL (ref 7–25)
CHLORIDE: 102 mmol/L (ref 98–110)
CO2: 21 mmol/L (ref 20–31)
CREATININE: 1.61 mg/dL — AB (ref 0.70–1.11)
Calcium: 9.3 mg/dL (ref 8.6–10.3)
GFR, EST AFRICAN AMERICAN: 44 mL/min — AB (ref >=60)
GFR, Est Non African American: 38 mL/min — ABNORMAL LOW (ref >=60)
Glucose, Bld: 88 mg/dL (ref 65–99)
Potassium: 3.8 mmol/L (ref 3.5–5.3)
Sodium: 136 mmol/L (ref 135–146)
TOTAL PROTEIN: 6.6 g/dL (ref 6.1–8.1)

## 2017-06-24 LAB — HEMOGLOBIN A1C
Hgb A1c MFr Bld: 4.8 % (ref <5.7)
MEAN PLASMA GLUCOSE: 91 mg/dL

## 2017-06-24 LAB — T4, FREE: Free T4: 1.3 ng/dL (ref 0.8–1.8)

## 2017-06-24 LAB — T3, FREE: T3, Free: 2.4 pg/mL (ref 2.3–4.2)

## 2017-06-24 LAB — TSH: TSH: 4.29 mIU/L (ref 0.40–4.50)

## 2017-07-22 ENCOUNTER — Ambulatory Visit: Payer: Medicare Other

## 2017-07-26 ENCOUNTER — Encounter: Payer: Self-pay | Admitting: Family Medicine

## 2017-07-26 ENCOUNTER — Ambulatory Visit (INDEPENDENT_AMBULATORY_CARE_PROVIDER_SITE_OTHER): Payer: Medicare Other | Admitting: Family Medicine

## 2017-07-26 VITALS — BP 158/55 | HR 75 | Wt 151.0 lb

## 2017-07-26 DIAGNOSIS — I129 Hypertensive chronic kidney disease with stage 1 through stage 4 chronic kidney disease, or unspecified chronic kidney disease: Secondary | ICD-10-CM | POA: Diagnosis not present

## 2017-07-26 DIAGNOSIS — N183 Chronic kidney disease, stage 3 (moderate): Secondary | ICD-10-CM | POA: Diagnosis not present

## 2017-07-26 NOTE — Progress Notes (Signed)
Benjamin Bryan is a 83 y.o. male who presents to [facility] today for follow-up hypertension and chronic kidney disease. Patient has a long history of hypertension. He takes amlodipine 5 mg twice daily. He notes at home his blood pressure is usually in the systolic 322-025K range. He notes that with blood pressure in this range she'll typically experienced lightheadedness at times especially when he stands up. This is been ongoing for quite a while. He is reluctant to increase his blood pressure medications. Overall however he's doing quite well. He denies chest pain palpitations or shortness of breath.  He was also found to have mildly elevated creatinine above his baseline of around 1.3 a month ago. He is here today for laboratory recheck. He denies any leg swelling or shortness of breath.   Past Medical History:  Diagnosis Date  . Hyperlipidemia   . Hypertension   . Shingles   . Thyroid disease    Past Surgical History:  Procedure Laterality Date  . radical prostectomy  1994  . removed section of intestine  08/24/2005   polyp   Social History  Substance Use Topics  . Smoking status: Former Smoker    Quit date: 12/11/1984  . Smokeless tobacco: Never Used  . Alcohol use 10.8 - 12.0 oz/week    18 - 20 Standard drinks or equivalent per week   family history includes Prostate cancer in his father; Stroke in his father.  ROS as above:  Medications: Current Outpatient Prescriptions  Medication Sig Dispense Refill  . amLODipine (NORVASC) 5 MG tablet Take 1 tablet (5 mg total) by mouth 2 (two) times daily. Take two tablets per day 180 tablet 1  . aspirin 81 MG tablet Take 81 mg by mouth daily.    Marland Kitchen atorvastatin (LIPITOR) 10 MG tablet Take 1 tablet (10 mg total) by mouth daily. 90 tablet 1  . famotidine (PEPCID) 20 MG tablet Take 20 mg by mouth daily.    .  Glucosamine-Chondroitin 1500-1200 MG/30ML LIQD Take by mouth. Take two tablets per day    . levothyroxine (SYNTHROID, LEVOTHROID) 50 MCG tablet take 1 tablet by mouth once daily for THYROID 90 tablet 1   No current facility-administered medications for this visit.    No Known Allergies  Health Maintenance Health Maintenance  Topic Date Due  . TETANUS/TDAP  12/02/1947  . INFLUENZA VACCINE  07/07/2017  . PNA vac Low Risk Adult (2 of 2 - PPSV23) 09/16/2017     Exam:  BP (!) 158/55   Pulse 75   Wt 151 lb (68.5 kg)   BMI 25.13 kg/m  Gen: Well NAD HEENT: EOMI,  MMM Lungs: Normal work of breathing. CTABL Heart: RRR no MRG Abd: NABS, Soft. Nondistended, Nontender Exts: Brisk capillary refill, warm and well perfused. No edema   No results found for this or any previous visit (from the past 72 hour(s)). No results found.    Assessment and Plan: 83 y.o. male with  Hypertension doing reasonably well. I'm reluctant to get blood pressure medicine much lower than it is now because I think it'll become hypotensive and may pass out and injured himself. Continue current regimen.  Chronic kidney disease with mild increase elevated creatinine. Plan to recheck metabolic panel today to trend the increased creatinine.     Orders Placed This Encounter  Procedures  . BASIC METABOLIC PANEL WITH GFR   No orders of the defined types were placed  in this encounter.    Discussed warning signs or symptoms. Please see discharge instructions. Patient expresses understanding.

## 2017-07-26 NOTE — Patient Instructions (Addendum)
Thank you for coming in today. Get labs today.  I will get results to you soon.  Get a flu shot this fall.  You can schedule a nurse visit to have it done here or get it at your pharmacy.  Let me know when you get it.

## 2017-07-27 LAB — BASIC METABOLIC PANEL WITH GFR
BUN: 19 mg/dL (ref 7–25)
CALCIUM: 9.1 mg/dL (ref 8.6–10.3)
CO2: 20 mmol/L (ref 20–32)
CREATININE: 1.43 mg/dL — AB (ref 0.70–1.11)
Chloride: 102 mmol/L (ref 98–110)
GFR, Est African American: 51 mL/min — ABNORMAL LOW (ref >=60)
GFR, Est Non African American: 44 mL/min — ABNORMAL LOW (ref >=60)
Glucose, Bld: 112 mg/dL — ABNORMAL HIGH (ref 65–99)
Potassium: 3.8 mmol/L (ref 3.5–5.3)
SODIUM: 140 mmol/L (ref 135–146)

## 2017-09-21 ENCOUNTER — Ambulatory Visit: Payer: Medicare Other | Admitting: Family Medicine

## 2017-10-09 ENCOUNTER — Other Ambulatory Visit: Payer: Self-pay | Admitting: Family Medicine

## 2018-01-26 ENCOUNTER — Encounter: Payer: Self-pay | Admitting: Family Medicine

## 2018-01-26 ENCOUNTER — Ambulatory Visit (INDEPENDENT_AMBULATORY_CARE_PROVIDER_SITE_OTHER): Payer: Medicare Other | Admitting: Family Medicine

## 2018-01-26 ENCOUNTER — Other Ambulatory Visit: Payer: Self-pay | Admitting: Family Medicine

## 2018-01-26 ENCOUNTER — Ambulatory Visit (INDEPENDENT_AMBULATORY_CARE_PROVIDER_SITE_OTHER): Payer: Medicare Other

## 2018-01-26 ENCOUNTER — Telehealth: Payer: Self-pay

## 2018-01-26 VITALS — BP 164/68 | HR 88 | Wt 154.0 lb

## 2018-01-26 DIAGNOSIS — Z125 Encounter for screening for malignant neoplasm of prostate: Secondary | ICD-10-CM | POA: Diagnosis not present

## 2018-01-26 DIAGNOSIS — I129 Hypertensive chronic kidney disease with stage 1 through stage 4 chronic kidney disease, or unspecified chronic kidney disease: Secondary | ICD-10-CM | POA: Diagnosis not present

## 2018-01-26 DIAGNOSIS — E039 Hypothyroidism, unspecified: Secondary | ICD-10-CM | POA: Diagnosis not present

## 2018-01-26 DIAGNOSIS — M79605 Pain in left leg: Secondary | ICD-10-CM

## 2018-01-26 DIAGNOSIS — M47896 Other spondylosis, lumbar region: Secondary | ICD-10-CM | POA: Diagnosis not present

## 2018-01-26 DIAGNOSIS — I708 Atherosclerosis of other arteries: Secondary | ICD-10-CM

## 2018-01-26 DIAGNOSIS — E782 Mixed hyperlipidemia: Secondary | ICD-10-CM | POA: Diagnosis not present

## 2018-01-26 DIAGNOSIS — I10 Essential (primary) hypertension: Secondary | ICD-10-CM

## 2018-01-26 DIAGNOSIS — N183 Chronic kidney disease, stage 3 (moderate): Secondary | ICD-10-CM

## 2018-01-26 DIAGNOSIS — I700 Atherosclerosis of aorta: Secondary | ICD-10-CM | POA: Diagnosis not present

## 2018-01-26 DIAGNOSIS — I7090 Unspecified atherosclerosis: Secondary | ICD-10-CM | POA: Diagnosis not present

## 2018-01-26 DIAGNOSIS — J449 Chronic obstructive pulmonary disease, unspecified: Secondary | ICD-10-CM

## 2018-01-26 DIAGNOSIS — Z23 Encounter for immunization: Secondary | ICD-10-CM | POA: Diagnosis not present

## 2018-01-26 DIAGNOSIS — M25552 Pain in left hip: Secondary | ICD-10-CM | POA: Diagnosis not present

## 2018-01-26 HISTORY — DX: Atherosclerosis of aorta: I70.0

## 2018-01-26 LAB — PSA

## 2018-01-26 LAB — COMPLETE METABOLIC PANEL WITH GFR
AG Ratio: 2 (calc) (ref 1.0–2.5)
ALBUMIN MSPROF: 4.7 g/dL (ref 3.6–5.1)
ALT: 13 U/L (ref 9–46)
AST: 16 U/L (ref 10–35)
Alkaline phosphatase (APISO): 67 U/L (ref 40–115)
BILIRUBIN TOTAL: 0.8 mg/dL (ref 0.2–1.2)
BUN / CREAT RATIO: 12 (calc) (ref 6–22)
BUN: 18 mg/dL (ref 7–25)
CHLORIDE: 101 mmol/L (ref 98–110)
CO2: 28 mmol/L (ref 20–32)
Calcium: 9.4 mg/dL (ref 8.6–10.3)
Creat: 1.53 mg/dL — ABNORMAL HIGH (ref 0.70–1.11)
GFR, EST AFRICAN AMERICAN: 47 mL/min/{1.73_m2} — AB (ref >=60)
GFR, Est Non African American: 40 mL/min/{1.73_m2} — ABNORMAL LOW (ref >=60)
GLOBULIN: 2.4 g/dL (ref 1.9–3.7)
Glucose, Bld: 110 mg/dL — ABNORMAL HIGH (ref 65–99)
POTASSIUM: 3.5 mmol/L (ref 3.5–5.3)
SODIUM: 138 mmol/L (ref 135–146)
TOTAL PROTEIN: 7.1 g/dL (ref 6.1–8.1)

## 2018-01-26 LAB — CBC
HCT: 42.2 % (ref 38.5–50.0)
HEMOGLOBIN: 14.5 g/dL (ref 13.2–17.1)
MCH: 31.5 pg (ref 27.0–33.0)
MCHC: 34.4 g/dL (ref 32.0–36.0)
MCV: 91.5 fL (ref 80.0–100.0)
MPV: 10.5 fL (ref 7.5–12.5)
Platelets: 343 10*3/uL (ref 140–400)
RBC: 4.61 10*6/uL (ref 4.20–5.80)
RDW: 11.8 % (ref 11.0–15.0)
WBC: 7.7 10*3/uL (ref 3.8–10.8)

## 2018-01-26 LAB — LIPID PANEL W/REFLEX DIRECT LDL
Cholesterol: 151 mg/dL (ref <200)
HDL: 61 mg/dL (ref >40)
LDL CHOLESTEROL (CALC): 70 mg/dL
Non-HDL Cholesterol (Calc): 90 mg/dL (calc) (ref <130)
Total CHOL/HDL Ratio: 2.5 (calc) (ref <5.0)
Triglycerides: 117 mg/dL (ref <150)

## 2018-01-26 LAB — T3, FREE: T3 FREE: 2.7 pg/mL (ref 2.3–4.2)

## 2018-01-26 LAB — T4, FREE: FREE T4: 1.2 ng/dL (ref 0.8–1.8)

## 2018-01-26 LAB — TSH: TSH: 4.66 m[IU]/L — AB (ref 0.40–4.50)

## 2018-01-26 NOTE — Progress Notes (Signed)
Benjamin Bryan is a 84 y.o. male who presents to [facility]: [facility] today for joint pain, HTN, HLD, Thyroid and left hip pain.  Hypertension: Yovani takes amlodipine 5 mg twice daily for some time now.  He tolerates it well with no chest pain palpitations or shortness of breath.  He brings his blood pressure log showing pressures typically in the 130s-140s at home.  Occasionally has blood pressures as low as 732 systolic.  He denies significant lightheadedness or dizziness.  Hyperlipidemia: Kelsie takes atorvastatin and aspirin daily due to lipids and vascular disease.  He tolerates medications well with no significant muscle aches or pains.  Thyroid disease: Lehi takes levothyroxine daily for hypothyroidism.  He feels well with no new rashes skin hair or nail changes.  He does not feel too hot or too cold.  Joint pain: Michaeal notes vague occasional joint pains.  He attributes this to arthritis and wants some recommendations for over-the-counter treatment for occasional arthritis.  He is not really using much right now.  Left hip pain: Careem notes occasional pain in his left hip and anterior thigh occurring when he stands up from a seated position for the last 6 months.  The pain is moderate in does not interfere with his ability to walk normally.  He has not tried any treatment nor had any workup yet.  He denies any injury.   Past Medical History:  Diagnosis Date  . Hyperlipidemia   . Hypertension   . Shingles   . Thyroid disease    Past Surgical History:  Procedure Laterality Date  . radical prostectomy  1994  . removed section of intestine  08/24/2005   polyp   Social History   Tobacco Use  . Smoking status: Former Smoker    Last attempt to quit: 12/11/1984    Years since quitting: 33.1  . Smokeless tobacco: Never Used  Substance Use Topics  . Alcohol use: Yes   Alcohol/week: 10.8 - 12.0 oz    Types: 18 - 20 Standard drinks or equivalent per week   family history includes Prostate cancer in his father; Stroke in his father.  ROS as above:  Medications: Current Outpatient Medications  Medication Sig Dispense Refill  . amLODipine (NORVASC) 5 MG tablet TAKE 1 TABLET BY MOUTH TWICE DAILY 180 tablet 1  . aspirin 81 MG tablet Take 81 mg by mouth daily.    Marland Kitchen atorvastatin (LIPITOR) 10 MG tablet TAKE 1 TABLET BY MOUTH ONCE DAILY 90 tablet 1  . famotidine (PEPCID) 20 MG tablet Take 20 mg by mouth daily.    . Glucosamine-Chondroitin 1500-1200 MG/30ML LIQD Take by mouth. Take two tablets per day    . levothyroxine (SYNTHROID, LEVOTHROID) 50 MCG tablet TAKE 1 TABLET BY MOUTH ONCE DAILY FOR  THYROID 90 tablet 1   No current facility-administered medications for this visit.    No Known Allergies  Health Maintenance Health Maintenance  Topic Date Due  . TETANUS/TDAP  12/02/1947  . PNA vac Low Risk Adult (2 of 2 - PPSV23) 09/16/2017  . INFLUENZA VACCINE  Completed     Exam:  BP (!) 164/68   Pulse 88   Wt 154 lb (69.9 kg)   BMI 25.63 kg/m  Gen: Well NAD HEENT: EOMI,  MMM Lungs: Normal work of breathing. CTABL Heart: RRR no MRG Abd: NABS, Soft. Nondistended, Nontender Exts: Brisk capillary refill, warm and well perfused.  Left hip normal-appearing nontender normal  motion normal gait.  Strength is intact.  X-ray left hip and left femur images independently reviewed by me: No significant DJD of the left femoral acetabular joint.  Small bone fragments present near the greater trochanter and near the joint line likely old.  Femur is normal-appearing Inguinal atherosclerosis disease present on the left leg. Waiting formal radiology review   No results found for this or any previous visit (from the past 72 hour(s)). No results found.    Assessment and Plan: 84 y.o. male with  HTN: Blood pressure a bit elevated today in clinic but at goal at  home.  I think super tight control is probably going to be more dangerous than the current control we have now.  Continue current regimen.  Check fasting labs listed below  Hyperlipidemia with evidence of inguinal atherosclerosis seen on x-ray.  Plan to continue statins check fasting lipid LDL goal less than 70.  Continue antiplatelet agents.  Trenten had a normal ABI study May 2018.   Hypothyroidism: Check TSH and free T3 free T4 levels.  Adjust medications as needed  Hip pain: Unclear etiology.  Doubtful for claudication based on normal ABI study recently.  Based on his x-ray he does not have a lot of hip arthritis.  I think the next step would be physical therapy.  He says he would like to wait on it a bit.  Vague joint pain likely due to arthritis.  Recommend Tylenol and Aspercreme over-the-counter.  If needed we can address individual joints more specifically.  Tdap and pneumonia 23 given today prior to discharge PSA obtained for prostate cancer history screening   Orders Placed This Encounter  Procedures  . DG FEMUR MIN 2 VIEWS LEFT    Standing Status:   Future    Number of Occurrences:   1    Standing Expiration Date:   03/27/2019    Order Specific Question:   Reason for Exam (SYMPTOM  OR DIAGNOSIS REQUIRED)    Answer:   eval left leg pain    Order Specific Question:   Preferred imaging location?    Answer:   [facility]    Order Specific Question:   Radiology Contrast Protocol - do NOT remove file path    Answer:   [email or web address]  . Tdap vaccine greater than or equal to 7yo IM  . Pneumococcal polysaccharide vaccine 23-valent greater than or equal to 2yo subcutaneous/IM  . CBC  . COMPLETE METABOLIC PANEL WITH GFR  . Lipid Panel w/reflex Direct LDL  . TSH  . T4, free  . T3, free  . PSA   No orders of the defined types were placed in this encounter.    Discussed warning signs or symptoms. Please see discharge  instructions. Patient expresses understanding.

## 2018-01-26 NOTE — Telephone Encounter (Signed)
Patient called stated that he forgot to bypass lab this morning and he went straight in there and got his labs done without fasting. He stated that it all happened so fast and the lab tech did not ask him if he was fasting before he got them done. He stated that if anything comes back abnormal he can get them done fasting in 6 months. Rhonda Cunningham,CMA

## 2018-01-26 NOTE — Patient Instructions (Signed)
Thank you for coming in today. Continue medicine.  Get labs soon in the morning.  Get xray now.  Recheck with me in 6 months.

## 2018-01-27 ENCOUNTER — Other Ambulatory Visit: Payer: Self-pay | Admitting: Family Medicine

## 2018-01-27 MED ORDER — LEVOTHYROXINE SODIUM 75 MCG PO TABS: 75.0000 ug | ORAL_TABLET | Freq: Every day | ORAL | 0 refills | Status: DC

## 2018-01-27 NOTE — Telephone Encounter (Signed)
patient has been advised. Dianey Suchy,CMA

## 2018-01-27 NOTE — Telephone Encounter (Signed)
Labs were ok even without fasting

## 2018-04-04 DIAGNOSIS — Z85828 Personal history of other malignant neoplasm of skin: Secondary | ICD-10-CM | POA: Diagnosis not present

## 2018-04-04 DIAGNOSIS — L820 Inflamed seborrheic keratosis: Secondary | ICD-10-CM | POA: Diagnosis not present

## 2018-04-04 DIAGNOSIS — L821 Other seborrheic keratosis: Secondary | ICD-10-CM | POA: Diagnosis not present

## 2018-04-04 DIAGNOSIS — Z08 Encounter for follow-up examination after completed treatment for malignant neoplasm: Secondary | ICD-10-CM | POA: Diagnosis not present

## 2018-04-04 DIAGNOSIS — D485 Neoplasm of uncertain behavior of skin: Secondary | ICD-10-CM | POA: Diagnosis not present

## 2018-04-08 ENCOUNTER — Other Ambulatory Visit: Payer: Self-pay | Admitting: Family Medicine

## 2018-04-28 ENCOUNTER — Telehealth: Payer: Self-pay | Admitting: Family Medicine

## 2018-04-28 DIAGNOSIS — I129 Hypertensive chronic kidney disease with stage 1 through stage 4 chronic kidney disease, or unspecified chronic kidney disease: Secondary | ICD-10-CM | POA: Diagnosis not present

## 2018-04-28 DIAGNOSIS — E039 Hypothyroidism, unspecified: Secondary | ICD-10-CM | POA: Diagnosis not present

## 2018-04-28 DIAGNOSIS — N183 Chronic kidney disease, stage 3 unspecified: Secondary | ICD-10-CM

## 2018-04-28 NOTE — Telephone Encounter (Signed)
Labs ordered for recheck TSH and CKD

## 2018-04-29 LAB — TSH: TSH: 2.8 m[IU]/L (ref 0.40–4.50)

## 2018-04-29 LAB — COMPLETE METABOLIC PANEL WITH GFR
AG RATIO: 1.7 (calc) (ref 1.0–2.5)
ALBUMIN MSPROF: 4.2 g/dL (ref 3.6–5.1)
ALT: 10 U/L (ref 9–46)
AST: 14 U/L (ref 10–35)
Alkaline phosphatase (APISO): 59 U/L (ref 40–115)
BILIRUBIN TOTAL: 0.8 mg/dL (ref 0.2–1.2)
BUN / CREAT RATIO: 14 (calc) (ref 6–22)
BUN: 19 mg/dL (ref 7–25)
CHLORIDE: 103 mmol/L (ref 98–110)
CO2: 27 mmol/L (ref 20–32)
Calcium: 9.4 mg/dL (ref 8.6–10.3)
Creat: 1.4 mg/dL — ABNORMAL HIGH (ref 0.70–1.11)
GFR, EST NON AFRICAN AMERICAN: 45 mL/min/{1.73_m2} — AB (ref >=60)
GFR, Est African American: 52 mL/min/{1.73_m2} — ABNORMAL LOW (ref >=60)
Globulin: 2.5 g/dL (calc) (ref 1.9–3.7)
Glucose, Bld: 98 mg/dL (ref 65–99)
POTASSIUM: 4 mmol/L (ref 3.5–5.3)
Sodium: 140 mmol/L (ref 135–146)
Total Protein: 6.7 g/dL (ref 6.1–8.1)

## 2018-04-29 LAB — CBC
HEMATOCRIT: 39.9 % (ref 38.5–50.0)
HEMOGLOBIN: 13.4 g/dL (ref 13.2–17.1)
MCH: 31 pg (ref 27.0–33.0)
MCHC: 33.6 g/dL (ref 32.0–36.0)
MCV: 92.4 fL (ref 80.0–100.0)
MPV: 10.1 fL (ref 7.5–12.5)
Platelets: 359 10*3/uL (ref 140–400)
RBC: 4.32 10*6/uL (ref 4.20–5.80)
RDW: 11.6 % (ref 11.0–15.0)
WBC: 6.1 10*3/uL (ref 3.8–10.8)

## 2018-04-29 MED ORDER — LEVOTHYROXINE SODIUM 75 MCG PO TABS: 75.0000 ug | ORAL_TABLET | Freq: Every day | ORAL | 2 refills | Status: DC

## 2018-04-29 NOTE — Addendum Note (Signed)
Addended by: Gregor Hams on: 04/29/2018 01:44 PM   Modules accepted: Orders

## 2018-07-06 ENCOUNTER — Other Ambulatory Visit: Payer: Self-pay | Admitting: Family Medicine

## 2018-07-26 ENCOUNTER — Encounter: Payer: Self-pay | Admitting: Family Medicine

## 2018-07-26 ENCOUNTER — Ambulatory Visit (INDEPENDENT_AMBULATORY_CARE_PROVIDER_SITE_OTHER): Payer: Medicare Other | Admitting: Family Medicine

## 2018-07-26 VITALS — BP 132/61 | HR 77 | Wt 144.0 lb

## 2018-07-26 DIAGNOSIS — N183 Chronic kidney disease, stage 3 (moderate): Secondary | ICD-10-CM | POA: Diagnosis not present

## 2018-07-26 DIAGNOSIS — I129 Hypertensive chronic kidney disease with stage 1 through stage 4 chronic kidney disease, or unspecified chronic kidney disease: Secondary | ICD-10-CM

## 2018-07-26 DIAGNOSIS — E782 Mixed hyperlipidemia: Secondary | ICD-10-CM

## 2018-07-26 DIAGNOSIS — E039 Hypothyroidism, unspecified: Secondary | ICD-10-CM | POA: Diagnosis not present

## 2018-07-26 NOTE — Patient Instructions (Signed)
Thank you for coming in today. Recheck in 5 months for medicare well exam  Continue current medicine.  Return sooner if needed.   When you get the flu shot at the pharmacy let me know.

## 2018-07-26 NOTE — Progress Notes (Signed)
Benjamin Bryan is a 84 y.o. male who presents to [facility]: [facility] today for 6 month check in.   He is here for his 6 month check in. He says he is generally feeling well and does not have any complaints today. His HTN is well controlled and he is tolerating his amlodipine well. He says he checks his pressure at home 3-4 times weekly and it is usually in the 867Y-195K systolic. He denies dizziness or lightheadedness.   Hypothyroid: this is also well controlled. We recently increased his synthroid from 50 mg to 75 mg and he is tolerating it well.    ROS as above:  Exam:  BP 132/61   Pulse 77   Wt 144 lb (65.3 kg)   BMI 23.96 kg/m  Gen: Well NAD HEENT: EOMI,  MMM Lungs: Normal work of breathing. CTABL Heart: irregular rhythm, no MRG Abd: NABS, Soft. Nondistended, Nontender Exts: Brisk capillary refill, warm and well perfused.   Lab and Radiology Results Lab Results  Component Value Date   TSH 2.80 04/28/2018     Chemistry      Component Value Date/Time   NA 140 04/28/2018 0804   K 4.0 04/28/2018 0804   CL 103 04/28/2018 0804   CO2 27 04/28/2018 0804   BUN 19 04/28/2018 0804   CREATININE 1.40 (H) 04/28/2018 0804      Component Value Date/Time   CALCIUM 9.4 04/28/2018 0804   CALCIUM 9.0 03/18/2015   ALKPHOS 60 06/23/2017 1526   AST 14 04/28/2018 0804   ALT 10 04/28/2018 0804   BILITOT 0.8 04/28/2018 0804     Lab Results  Component Value Date   CHOL 151 01/26/2018   HDL 61 01/26/2018   LDLCALC 70 01/26/2018   TRIG 117 01/26/2018   CHOLHDL 2.5 01/26/2018      Assessment and Plan: 84 y.o. male with HTN, hypthyroid here for 6 month check in. He is doing extremely well and tolerating all of his medicines. The plan will be to continue as we are and not make any changes. We are happy with his systolic blood pressure in the 130s-140s and we will continue  amlodipine 5 mg.  He is tolerating his increased synthroid well and the plan will be to continue that as well. We will not get labs today as he had them done last visit. He says his will get the influenza vaccination at his local pharmacy.   Continue lipid management.  Due for lab recheck in about 6 months.  Will schedule Medicare wellness exam then.  Patient will get influenza vaccine at the pharmacy later this year.  Declined today.    Historical information moved to improve visibility of documentation.  Past Medical History:  Diagnosis Date  . Hyperlipidemia   . Hypertension   . Shingles   . Thyroid disease    Past Surgical History:  Procedure Laterality Date  . radical prostectomy  1994  . removed section of intestine  08/24/2005   polyp   Social History   Tobacco Use  . Smoking status: Former Smoker    Last attempt to quit: 12/11/1984    Years since quitting: 33.6  . Smokeless tobacco: Never Used  Substance Use Topics  . Alcohol use: Yes    Alcohol/week: 18.0 - 20.0 standard drinks    Types: 18 - 20 Standard drinks or equivalent per week   family history includes Prostate cancer in his father;  Stroke in his father.  Medications: Current Outpatient Medications  Medication Sig Dispense Refill  . amLODipine (NORVASC) 5 MG tablet TAKE 1 TABLET BY MOUTH TWICE DAILY 180 tablet 0  . aspirin 81 MG tablet Take 81 mg by mouth daily.    Marland Kitchen atorvastatin (LIPITOR) 10 MG tablet TAKE 1 TABLET BY MOUTH ONCE DAILY 90 tablet 0  . famotidine (PEPCID) 20 MG tablet Take 20 mg by mouth daily.    . Glucosamine-Chondroitin 1500-1200 MG/30ML LIQD Take by mouth. Take two tablets per day    . levothyroxine (SYNTHROID, LEVOTHROID) 75 MCG tablet Take 1 tablet (75 mcg total) by mouth daily. 90 tablet 2   No current facility-administered medications for this visit.    No Known Allergies   Discussed warning signs or symptoms. Please see discharge instructions. Patient expresses  understanding.  I personally was present and performed or re-performed the history, physical exam and medical decision-making activities of this service and have verified that the service and findings are accurately documented in the student's note. ___________________________________________ Lynne Leader M.D., ABFM., CAQSM. Primary Care and Sports Medicine Adjunct Instructor of [employer]

## 2018-09-28 ENCOUNTER — Encounter: Payer: Self-pay | Admitting: Family Medicine

## 2018-09-28 ENCOUNTER — Ambulatory Visit (INDEPENDENT_AMBULATORY_CARE_PROVIDER_SITE_OTHER): Payer: Medicare Other | Admitting: Family Medicine

## 2018-09-28 ENCOUNTER — Ambulatory Visit (INDEPENDENT_AMBULATORY_CARE_PROVIDER_SITE_OTHER): Payer: Medicare Other

## 2018-09-28 VITALS — BP 147/59 | HR 106 | Temp 99.1°F | Wt 149.0 lb

## 2018-09-28 DIAGNOSIS — J189 Pneumonia, unspecified organism: Secondary | ICD-10-CM

## 2018-09-28 DIAGNOSIS — R5383 Other fatigue: Secondary | ICD-10-CM

## 2018-09-28 DIAGNOSIS — J181 Lobar pneumonia, unspecified organism: Secondary | ICD-10-CM

## 2018-09-28 DIAGNOSIS — R05 Cough: Secondary | ICD-10-CM | POA: Diagnosis not present

## 2018-09-28 DIAGNOSIS — N183 Chronic kidney disease, stage 3 unspecified: Secondary | ICD-10-CM

## 2018-09-28 DIAGNOSIS — R059 Cough, unspecified: Secondary | ICD-10-CM

## 2018-09-28 DIAGNOSIS — J449 Chronic obstructive pulmonary disease, unspecified: Secondary | ICD-10-CM

## 2018-09-28 DIAGNOSIS — I129 Hypertensive chronic kidney disease with stage 1 through stage 4 chronic kidney disease, or unspecified chronic kidney disease: Secondary | ICD-10-CM

## 2018-09-28 MED ORDER — LEVOFLOXACIN 500 MG PO TABS: 500.0000 mg | ORAL_TABLET | Freq: Every day | ORAL | 0 refills | Status: DC

## 2018-09-28 MED ORDER — BENZONATATE 200 MG PO CAPS: 200.0000 mg | ORAL_CAPSULE | Freq: Three times a day (TID) | ORAL | 0 refills | Status: DC | PRN

## 2018-09-28 NOTE — Progress Notes (Signed)
Benjamin Bryan is a 84 y.o. male who presents to [facility] today for cough congestion fatigue.  Symptoms present for a few weeks worsening recently.  He notes cough is mildly productive.  He denies wheezing or significant shortness of breath.  He notes mild fatigue worsening recently as well.  He is tried some over-the-counter medicines which help a little.  No vomiting or diarrhea no chest pain or palpitations.   ROS as above:  Exam:  BP (!) 147/59   Pulse (!) 106   Temp 99.1 F (37.3 C) (Oral)   Wt 149 lb (67.6 kg)   SpO2 99%   BMI 24.79 kg/m  Wt Readings from Last 5 Encounters:  09/28/18 149 lb (67.6 kg)  07/26/18 144 lb (65.3 kg)  01/26/18 154 lb (69.9 kg)  07/26/17 151 lb (68.5 kg)  06/23/17 151 lb (68.5 kg)    Gen: Well NAD HEENT: EOMI,  MMM Lungs: Normal work of breathing.  Coarse breath sounds present bilaterally Heart: RRR no MRG Abd: NABS, Soft. Nondistended, Nontender Exts: Brisk capillary refill, warm and well perfused.   Lab and Radiology Results Results for orders placed or performed in visit on 09/28/18 (from the past 72 hour(s))  CBC with Differential/Platelet     Status: Abnormal   Collection Time: 09/28/18  3:20 PM  Result Value Ref Range   WBC 14.6 (H) 3.8 - 10.8 Thousand/uL   RBC 3.61 (L) 4.20 - 5.80 Million/uL   Hemoglobin 11.3 (L) 13.2 - 17.1 g/dL   HCT 33.0 (L) 38.5 - 50.0 %   MCV 91.4 80.0 - 100.0 fL   MCH 31.3 27.0 - 33.0 pg   MCHC 34.2 32.0 - 36.0 g/dL   RDW 11.8 11.0 - 15.0 %   Platelets 436 (H) 140 - 400 Thousand/uL   MPV 10.9 7.5 - 12.5 fL   Neutro Abs 11,563 (H) 1,500 - 7,800 cells/uL   Lymphs Abs 934 850 - 3,900 cells/uL   WBC mixed population 2,044 (H) 200 - 950 cells/uL   Eosinophils Absolute 15 15 - 500 cells/uL   Basophils Absolute 44 0 - 200 cells/uL   Neutrophils Relative % 79.2 %   Total Lymphocyte 6.4 %     Monocytes Relative 14.0 %   Eosinophils Relative 0.1 %   Basophils Relative 0.3 %  COMPLETE METABOLIC PANEL WITH GFR     Status: Abnormal   Collection Time: 09/28/18  3:20 PM  Result Value Ref Range   Glucose, Bld 123 (H) 65 - 99 mg/dL    Comment: .            Fasting reference interval . For someone without known diabetes, a glucose value between 100 and 125 mg/dL is consistent with prediabetes and should be confirmed with a follow-up test. .    BUN 19 7 - 25 mg/dL   Creat 1.51 (H) 0.70 - 1.11 mg/dL    Comment: For patients >35 years of age, the reference limit for Creatinine is approximately 13% higher for people identified as African-American. .    GFR, Est Non African American 41 (L) > OR = 60 mL/min/1.42m2   GFR, Est African American 47 (L) > OR = 60 mL/min/1.27m2   BUN/Creatinine Ratio 13 6 - 22 (calc)   Sodium 134 (L) 135 - 146 mmol/L   Potassium 4.5 3.5 - 5.3 mmol/L   Chloride 99 98 - 110 mmol/L   CO2 23 20 -  32 mmol/L   Calcium 8.8 8.6 - 10.3 mg/dL   Total Protein 6.0 (L) 6.1 - 8.1 g/dL   Albumin 3.5 (L) 3.6 - 5.1 g/dL   Globulin 2.5 1.9 - 3.7 g/dL (calc)   AG Ratio 1.4 1.0 - 2.5 (calc)   Total Bilirubin 1.1 0.2 - 1.2 mg/dL   Alkaline phosphatase (APISO) 131 (H) 40 - 115 U/L   AST 56 (H) 10 - 35 U/L   ALT 47 (H) 9 - 46 U/L   Two-view chest x-ray images personally independently reviewed.  Infiltrate present in the right upper middle lobe. Await formal radiology review.    Assessment and Plan: 84 y.o. male with  Community-acquired pneumonia.  Chest x-ray images were available at the time of discharge and per my read concerning for community-acquired pneumonia.  Patient was empirically treated with Levaquin and CBC and metabolic panel obtained.  Creatinine is stable and CBC does show leukocytosis.  Additionally will treat symptomatically with Tessalon Perles.  Continue Tylenol over-the-counter medications.  Recheck in a few weeks to follow-up clearing.  Return  sooner if needed.   Orders Placed This Encounter  Procedures  . DG Chest 2 View    Standing Status:   Future    Number of Occurrences:   1    Standing Expiration Date:   11/28/2019    Order Specific Question:   Reason for Exam (SYMPTOM  OR DIAGNOSIS REQUIRED)    Answer:   EVAL VASCULITIS/SARCOID    Order Specific Question:   Preferred imaging location?    Answer:   [facility]  . CBC with Differential/Platelet  . COMPLETE METABOLIC PANEL WITH GFR   Meds ordered this encounter  Medications  . levofloxacin (LEVAQUIN) 500 MG tablet    Sig: Take 1 tablet (500 mg total) by mouth daily.    Dispense:  7 tablet    Refill:  0  . benzonatate (TESSALON) 200 MG capsule    Sig: Take 1 capsule (200 mg total) by mouth 3 (three) times daily as needed for cough.    Dispense:  45 capsule    Refill:  0     Historical information moved to improve visibility of documentation.  Past Medical History:  Diagnosis Date  . Hyperlipidemia   . Hypertension   . Shingles   . Thyroid disease    Past Surgical History:  Procedure Laterality Date  . radical prostectomy  1994  . removed section of intestine  08/24/2005   polyp   Social History   Tobacco Use  . Smoking status: Former Smoker    Last attempt to quit: 12/11/1984    Years since quitting: 33.8  . Smokeless tobacco: Never Used  Substance Use Topics  . Alcohol use: Yes    Alcohol/week: 18.0 - 20.0 standard drinks    Types: 18 - 20 Standard drinks or equivalent per week   family history includes Prostate cancer in his father; Stroke in his father.  Medications: Current Outpatient Medications  Medication Sig Dispense Refill  . amLODipine (NORVASC) 5 MG tablet TAKE 1 TABLET BY MOUTH TWICE DAILY 180 tablet 0  . aspirin 81 MG tablet Take 81 mg by mouth daily.    Marland Kitchen atorvastatin (LIPITOR) 10 MG tablet TAKE 1 TABLET BY MOUTH ONCE DAILY 90 tablet 0  . famotidine (PEPCID) 20 MG tablet Take 20 mg by mouth daily.    .  Glucosamine-Chondroitin 1500-1200 MG/30ML LIQD Take by mouth. Take two tablets per day    . levothyroxine (  SYNTHROID, LEVOTHROID) 75 MCG tablet Take 1 tablet (75 mcg total) by mouth daily. 90 tablet 2  . benzonatate (TESSALON) 200 MG capsule Take 1 capsule (200 mg total) by mouth 3 (three) times daily as needed for cough. 45 capsule 0  . levofloxacin (LEVAQUIN) 500 MG tablet Take 1 tablet (500 mg total) by mouth daily. 7 tablet 0   No current facility-administered medications for this visit.    No Known Allergies   Discussed warning signs or symptoms. Please see discharge instructions. Patient expresses understanding.

## 2018-09-28 NOTE — Patient Instructions (Signed)
Thank you for coming in today. I think you have pneumonia.  Get labs today on your way out.  Take levaquin antibiotic 1 pill daily for 7 days.  Take tessalon pearles for cough as needed.  Take over the counter muccinex to help break up the phlegm  Recheck with me in 3 weeks or sooner if needed.     Community-Acquired Pneumonia, Adult Pneumonia is an infection of the lungs. There are different types of pneumonia. One type can develop while a person is in a hospital. A different type, called community-acquired pneumonia, develops in people who are not, or have not recently been, in the hospital or other health care facility. What are the causes? Pneumonia may be caused by bacteria, viruses, or funguses. Community-acquired pneumonia is often caused by Streptococcus pneumonia bacteria. These bacteria are often passed from one person to another by breathing in droplets from the cough or sneeze of an infected person. What increases the risk? The condition is more likely to develop in:  People who havechronic diseases, such as chronic obstructive pulmonary disease (COPD), asthma, congestive heart failure, cystic fibrosis, diabetes, or kidney disease.  People who haveearly-stage or late-stage HIV.  People who havesickle cell disease.  People who havehad their spleen removed (splenectomy).  People who havepoor Human resources officer.  People who havemedical conditions that increase the risk of breathing in (aspirating) secretions their own mouth and nose.  People who havea weakened immune system (immunocompromised).  People who smoke.  People whotravel to areas where pneumonia-causing germs commonly exist.  People whoare around animal habitats or animals that have pneumonia-causing germs, including birds, bats, rabbits, cats, and farm animals.  What are the signs or symptoms? Symptoms of this condition include:  Adry cough.  A wet (productive) cough.  Fever.  Sweating.  Chest  pain, especially when breathing deeply or coughing.  Rapid breathing or difficulty breathing.  Shortness of breath.  Shaking chills.  Fatigue.  Muscle aches.  How is this diagnosed? Your health care provider will take a medical history and perform a physical exam. You may also have other tests, including:  Imaging studies of your chest, including X-rays.  Tests to check your blood oxygen level and other blood gases.  Other tests on blood, mucus (sputum), fluid around your lungs (pleural fluid), and urine.  If your pneumonia is severe, other tests may be done to identify the specific cause of your illness. How is this treated? The type of treatment that you receive depends on many factors, such as the cause of your pneumonia, the medicines you take, and other medical conditions that you have. For most adults, treatment and recovery from pneumonia may occur at home. In some cases, treatment must happen in a hospital. Treatment may include:  Antibiotic medicines, if the pneumonia was caused by bacteria.  Antiviral medicines, if the pneumonia was caused by a virus.  Medicines that are given by mouth or through an IV tube.  Oxygen.  Respiratory therapy.  Although rare, treating severe pneumonia may include:  Mechanical ventilation. This is done if you are not breathing well on your own and you cannot maintain a safe blood oxygen level.  Thoracentesis. This procedureremoves fluid around one lung or both lungs to help you breathe better.  Follow these instructions at home:  Take over-the-counter and prescription medicines only as told by your health care provider. ? Only takecough medicine if you are losing sleep. Understand that cough medicine can prevent your body's natural ability to remove mucus  from your lungs. ? If you were prescribed an antibiotic medicine, take it as told by your health care provider. Do not stop taking the antibiotic even if you start to feel  better.  Sleep in a semi-upright position at night. Try sleeping in a reclining chair, or place a few pillows under your head.  Do not use tobacco products, including cigarettes, chewing tobacco, and e-cigarettes. If you need help quitting, ask your health care provider.  Drink enough water to keep your urine clear or pale yellow. This will help to thin out mucus secretions in your lungs. How is this prevented? There are ways that you can decrease your risk of developing community-acquired pneumonia. Consider getting a pneumococcal vaccine if:  You are older than 82 years of age.  You are older than 82 years of age and are undergoing cancer treatment, have chronic lung disease, or have other medical conditions that affect your immune system. Ask your health care provider if this applies to you.  There are different types and schedules of pneumococcal vaccines. Ask your health care provider which vaccination option is best for you. You may also prevent community-acquired pneumonia if you take these actions:  Get an influenza vaccine every year. Ask your health care provider which type of influenza vaccine is best for you.  Go to the dentist on a regular basis.  Wash your hands often. Use hand sanitizer if soap and water are not available.  Contact a health care provider if:  You have a fever.  You are losing sleep because you cannot control your cough with cough medicine. Get help right away if:  You have worsening shortness of breath.  You have increased chest pain.  Your sickness becomes worse, especially if you are an older adult or have a weakened immune system.  You cough up blood. This information is not intended to replace advice given to you by your health care provider. Make sure you discuss any questions you have with your health care provider. Document Released: 11/23/2005 Document Revised: 04/02/2016 Document Reviewed: 03/20/2015 Elsevier Interactive Patient  Education  [employer].

## 2018-09-29 LAB — COMPLETE METABOLIC PANEL WITH GFR
AG RATIO: 1.4 (calc) (ref 1.0–2.5)
ALBUMIN MSPROF: 3.5 g/dL — AB (ref 3.6–5.1)
ALT: 47 U/L — ABNORMAL HIGH (ref 9–46)
AST: 56 U/L — ABNORMAL HIGH (ref 10–35)
Alkaline phosphatase (APISO): 131 U/L — ABNORMAL HIGH (ref 40–115)
BILIRUBIN TOTAL: 1.1 mg/dL (ref 0.2–1.2)
BUN / CREAT RATIO: 13 (calc) (ref 6–22)
BUN: 19 mg/dL (ref 7–25)
CHLORIDE: 99 mmol/L (ref 98–110)
CO2: 23 mmol/L (ref 20–32)
Calcium: 8.8 mg/dL (ref 8.6–10.3)
Creat: 1.51 mg/dL — ABNORMAL HIGH (ref 0.70–1.11)
GFR, EST AFRICAN AMERICAN: 47 mL/min/{1.73_m2} — AB (ref >=60)
GFR, EST NON AFRICAN AMERICAN: 41 mL/min/{1.73_m2} — AB (ref >=60)
GLUCOSE: 123 mg/dL — AB (ref 65–99)
Globulin: 2.5 g/dL (calc) (ref 1.9–3.7)
Potassium: 4.5 mmol/L (ref 3.5–5.3)
Sodium: 134 mmol/L — ABNORMAL LOW (ref 135–146)
TOTAL PROTEIN: 6 g/dL — AB (ref 6.1–8.1)

## 2018-09-29 LAB — CBC WITH DIFFERENTIAL/PLATELET
BASOS ABS: 44 {cells}/uL (ref 0–200)
Basophils Relative: 0.3 %
EOS ABS: 15 {cells}/uL (ref 15–500)
EOS PCT: 0.1 %
HEMATOCRIT: 33 % — AB (ref 38.5–50.0)
HEMOGLOBIN: 11.3 g/dL — AB (ref 13.2–17.1)
LYMPHS ABS: 934 {cells}/uL (ref 850–3900)
MCH: 31.3 pg (ref 27.0–33.0)
MCHC: 34.2 g/dL (ref 32.0–36.0)
MCV: 91.4 fL (ref 80.0–100.0)
MPV: 10.9 fL (ref 7.5–12.5)
Monocytes Relative: 14 %
Neutro Abs: 11563 cells/uL — ABNORMAL HIGH (ref 1500–7800)
Neutrophils Relative %: 79.2 %
PLATELETS: 436 10*3/uL — AB (ref 140–400)
RBC: 3.61 10*6/uL — ABNORMAL LOW (ref 4.20–5.80)
RDW: 11.8 % (ref 11.0–15.0)
Total Lymphocyte: 6.4 %
WBC: 14.6 10*3/uL — ABNORMAL HIGH (ref 3.8–10.8)
WBCMIX: 2044 {cells}/uL — AB (ref 200–950)

## 2018-10-04 ENCOUNTER — Telehealth: Payer: Self-pay

## 2018-10-04 DIAGNOSIS — J449 Chronic obstructive pulmonary disease, unspecified: Secondary | ICD-10-CM | POA: Diagnosis not present

## 2018-10-04 DIAGNOSIS — I2694 Multiple subsegmental pulmonary emboli without acute cor pulmonale: Secondary | ICD-10-CM | POA: Diagnosis not present

## 2018-10-04 DIAGNOSIS — I313 Pericardial effusion (noninflammatory): Secondary | ICD-10-CM | POA: Diagnosis not present

## 2018-10-04 DIAGNOSIS — R9431 Abnormal electrocardiogram [ECG] [EKG]: Secondary | ICD-10-CM | POA: Diagnosis not present

## 2018-10-04 DIAGNOSIS — J168 Pneumonia due to other specified infectious organisms: Secondary | ICD-10-CM | POA: Diagnosis not present

## 2018-10-04 DIAGNOSIS — N183 Chronic kidney disease, stage 3 (moderate): Secondary | ICD-10-CM | POA: Diagnosis not present

## 2018-10-04 DIAGNOSIS — E785 Hyperlipidemia, unspecified: Secondary | ICD-10-CM | POA: Diagnosis not present

## 2018-10-04 DIAGNOSIS — R7989 Other specified abnormal findings of blood chemistry: Secondary | ICD-10-CM | POA: Diagnosis not present

## 2018-10-04 DIAGNOSIS — J90 Pleural effusion, not elsewhere classified: Secondary | ICD-10-CM | POA: Diagnosis not present

## 2018-10-04 DIAGNOSIS — I129 Hypertensive chronic kidney disease with stage 1 through stage 4 chronic kidney disease, or unspecified chronic kidney disease: Secondary | ICD-10-CM | POA: Diagnosis not present

## 2018-10-04 DIAGNOSIS — Z79899 Other long term (current) drug therapy: Secondary | ICD-10-CM | POA: Diagnosis not present

## 2018-10-04 DIAGNOSIS — Z87891 Personal history of nicotine dependence: Secondary | ICD-10-CM | POA: Diagnosis not present

## 2018-10-04 DIAGNOSIS — I517 Cardiomegaly: Secondary | ICD-10-CM | POA: Diagnosis not present

## 2018-10-04 DIAGNOSIS — R0602 Shortness of breath: Secondary | ICD-10-CM | POA: Diagnosis not present

## 2018-10-04 DIAGNOSIS — E039 Hypothyroidism, unspecified: Secondary | ICD-10-CM | POA: Diagnosis not present

## 2018-10-04 DIAGNOSIS — J189 Pneumonia, unspecified organism: Secondary | ICD-10-CM | POA: Diagnosis not present

## 2018-10-04 DIAGNOSIS — D649 Anemia, unspecified: Secondary | ICD-10-CM | POA: Diagnosis not present

## 2018-10-04 DIAGNOSIS — I2699 Other pulmonary embolism without acute cor pulmonale: Secondary | ICD-10-CM | POA: Diagnosis not present

## 2018-10-04 DIAGNOSIS — J439 Emphysema, unspecified: Secondary | ICD-10-CM | POA: Diagnosis not present

## 2018-10-04 DIAGNOSIS — I081 Rheumatic disorders of both mitral and tricuspid valves: Secondary | ICD-10-CM | POA: Diagnosis not present

## 2018-10-04 DIAGNOSIS — I700 Atherosclerosis of aorta: Secondary | ICD-10-CM | POA: Diagnosis not present

## 2018-10-04 DIAGNOSIS — I10 Essential (primary) hypertension: Secondary | ICD-10-CM | POA: Diagnosis not present

## 2018-10-04 DIAGNOSIS — I272 Pulmonary hypertension, unspecified: Secondary | ICD-10-CM | POA: Diagnosis not present

## 2018-10-04 DIAGNOSIS — R918 Other nonspecific abnormal finding of lung field: Secondary | ICD-10-CM | POA: Diagnosis not present

## 2018-10-04 DIAGNOSIS — I519 Heart disease, unspecified: Secondary | ICD-10-CM | POA: Diagnosis not present

## 2018-10-04 NOTE — Telephone Encounter (Signed)
Pt calls today with complaints of not being able to walk, his body "giving out", extreme shortness of breath, difficulty breathing, and dizziness. Reports he can feel his heart racing on occasion.   Pt was seen in office last week but notes his symptoms have dramatically worsened.   I advised patient to go to emergency room and be evaluated. Pt notes his wife is home, I advised pt to have wife drive him. Pt hesitant, wanting to know if he could wait until the morning; I told pt that he needs to be evaluated tonight and the sooner he can go to ER the better. Pt agreeable.   FYI note to Dr Georgina Snell

## 2018-10-05 DIAGNOSIS — J159 Unspecified bacterial pneumonia: Secondary | ICD-10-CM | POA: Insufficient documentation

## 2018-10-05 NOTE — Telephone Encounter (Signed)
Called and spoke with patient's wife, he went to ER yesterday and was admitted. Pt in [facility]. In [place]

## 2018-10-05 NOTE — Telephone Encounter (Signed)
Called the patient to advise but did not get an answer. Could not leave a message on home vm because there was no vm. Benjamin Bryan,CMA

## 2018-10-05 NOTE — Telephone Encounter (Signed)
I have also attempted to call, no answer and no option to leave a msg

## 2018-10-05 NOTE — Telephone Encounter (Signed)
If Benjamin Bryan did not go to the emergency room he should be seen today.

## 2018-10-06 DIAGNOSIS — R748 Abnormal levels of other serum enzymes: Secondary | ICD-10-CM | POA: Insufficient documentation

## 2018-10-07 DIAGNOSIS — Z86711 Personal history of pulmonary embolism: Secondary | ICD-10-CM | POA: Insufficient documentation

## 2018-10-07 DIAGNOSIS — I2694 Multiple subsegmental pulmonary emboli without acute cor pulmonale: Secondary | ICD-10-CM | POA: Insufficient documentation

## 2018-10-07 HISTORY — DX: Personal history of pulmonary embolism: Z86.711

## 2018-10-09 MED ORDER — GENERIC EXTERNAL MEDICATION
10.00 | Status: DC
Start: ? — End: 2018-10-09

## 2018-10-09 MED ORDER — GENERIC EXTERNAL MEDICATION
2.00 | Status: DC
Start: 2018-10-08 — End: 2018-10-09

## 2018-10-09 MED ORDER — SODIUM CHLORIDE 0.9 % IV SOLN
10.00 | INTRAVENOUS | Status: DC
Start: ? — End: 2018-10-09

## 2018-10-09 MED ORDER — LEVOTHYROXINE SODIUM 25 MCG PO TABS
75.00 | ORAL_TABLET | ORAL | Status: DC
Start: 2018-10-08 — End: 2018-10-09

## 2018-10-09 MED ORDER — POLYETHYLENE GLYCOL 3350 17 G PO PACK
17.00 | PACK | ORAL | Status: DC
Start: ? — End: 2018-10-09

## 2018-10-09 MED ORDER — ACETAMINOPHEN 325 MG PO TABS
650.00 | ORAL_TABLET | ORAL | Status: DC
Start: ? — End: 2018-10-09

## 2018-10-09 MED ORDER — DOXYCYCLINE MONOHYDRATE 100 MG PO CAPS
100.00 | ORAL_CAPSULE | ORAL | Status: DC
Start: 2018-10-07 — End: 2018-10-09

## 2018-10-09 MED ORDER — GENERIC EXTERNAL MEDICATION
650.00 | Status: DC
Start: ? — End: 2018-10-09

## 2018-10-09 MED ORDER — NITROGLYCERIN 0.4 MG SL SUBL
0.40 | SUBLINGUAL_TABLET | SUBLINGUAL | Status: DC
Start: ? — End: 2018-10-09

## 2018-10-09 MED ORDER — CLONIDINE HCL 0.1 MG PO TABS
0.10 | ORAL_TABLET | ORAL | Status: DC
Start: ? — End: 2018-10-09

## 2018-10-09 MED ORDER — AMLODIPINE BESYLATE 5 MG PO TABS
5.00 | ORAL_TABLET | ORAL | Status: DC
Start: 2018-10-08 — End: 2018-10-09

## 2018-10-09 MED ORDER — GENERIC EXTERNAL MEDICATION
4.00 | Status: DC
Start: ? — End: 2018-10-09

## 2018-10-09 MED ORDER — ALBUTEROL SULFATE (2.5 MG/3ML) 0.083% IN NEBU
2.50 | INHALATION_SOLUTION | RESPIRATORY_TRACT | Status: DC
Start: ? — End: 2018-10-09

## 2018-10-11 ENCOUNTER — Other Ambulatory Visit: Payer: Self-pay | Admitting: Family Medicine

## 2018-10-19 ENCOUNTER — Ambulatory Visit (INDEPENDENT_AMBULATORY_CARE_PROVIDER_SITE_OTHER): Payer: Medicare Other | Admitting: Family Medicine

## 2018-10-19 ENCOUNTER — Encounter: Payer: Self-pay | Admitting: Family Medicine

## 2018-10-19 VITALS — BP 142/63 | HR 98 | Wt 141.0 lb

## 2018-10-19 DIAGNOSIS — J189 Pneumonia, unspecified organism: Secondary | ICD-10-CM

## 2018-10-19 DIAGNOSIS — I2694 Multiple subsegmental pulmonary emboli without acute cor pulmonale: Secondary | ICD-10-CM | POA: Diagnosis not present

## 2018-10-19 DIAGNOSIS — J181 Lobar pneumonia, unspecified organism: Secondary | ICD-10-CM | POA: Diagnosis not present

## 2018-10-19 DIAGNOSIS — I10 Essential (primary) hypertension: Secondary | ICD-10-CM

## 2018-10-19 MED ORDER — LISINOPRIL 5 MG PO TABS: 5.0000 mg | ORAL_TABLET | Freq: Every day | ORAL | 1 refills | Status: DC

## 2018-10-19 MED ORDER — ELIQUIS 5 MG PO TABS: 5.0000 mg | ORAL_TABLET | Freq: Two times a day (BID) | ORAL | 5 refills | Status: DC

## 2018-10-19 NOTE — Patient Instructions (Addendum)
Thank you for coming in today. Return at the end of November or early December for lab and xray recheck.  Start lisinopril at bedtime.  Continue amlodipine.  Continue Eliquis.   Let me know if you have a problem getting your medicine.  You can call the people that make Eliquis to get assistance information.    Lisinopril tablets What is this medicine? LISINOPRIL (lyse IN oh pril) is an ACE inhibitor. This medicine is used to treat high blood pressure and heart failure. It is also used to protect the heart immediately after a heart attack. This medicine may be used for other purposes; ask your health care provider or pharmacist if you have questions. COMMON BRAND NAME(S): Prinivil, Zestril What should I tell my health care provider before I take this medicine? They need to know if you have any of these conditions: -diabetes -heart or blood vessel disease -kidney disease -low blood pressure -previous swelling of the tongue, face, or lips with difficulty breathing, difficulty swallowing, hoarseness, or tightening of the throat -an unusual or allergic reaction to lisinopril, other ACE inhibitors, insect venom, foods, dyes, or preservatives -pregnant or trying to get pregnant -breast-feeding How should I use this medicine? Take this medicine by mouth with a glass of water. Follow the directions on your prescription label. You may take this medicine with or without food. If it upsets your stomach, take it with food. Take your medicine at regular intervals. Do not take it more often than directed. Do not stop taking except on your doctor's advice. Talk to your pediatrician regarding the use of this medicine in children. Special care may be needed. While this drug may be prescribed for children as young as 16 years of age for selected conditions, precautions do apply. Overdosage: If you think you have taken too much of this medicine contact a poison control center or emergency room at once. NOTE:  This medicine is only for you. Do not share this medicine with others. What if I miss a dose? If you miss a dose, take it as soon as you can. If it is almost time for your next dose, take only that dose. Do not take double or extra doses. What may interact with this medicine? Do not take this medicine with any of the following medications: -hymenoptera venom -sacubitril; valsartan This medicines may also interact with the following medications: -aliskiren -angiotensin receptor blockers, like losartan or valsartan -certain medicines for diabetes -diuretics -everolimus -gold compounds -lithium -NSAIDs, medicines for pain and inflammation, like ibuprofen or naproxen -potassium salts or supplements -salt substitutes -sirolimus -temsirolimus This list may not describe all possible interactions. Give your health care provider a list of all the medicines, herbs, non-prescription drugs, or dietary supplements you use. Also tell them if you smoke, drink alcohol, or use illegal drugs. Some items may interact with your medicine. What should I watch for while using this medicine? Visit your doctor or health care professional for regular check ups. Check your blood pressure as directed. Ask your doctor what your blood pressure should be, and when you should contact him or her. Do not treat yourself for coughs, colds, or pain while you are using this medicine without asking your doctor or health care professional for advice. Some ingredients may increase your blood pressure. Women should inform their doctor if they wish to become pregnant or think they might be pregnant. There is a potential for serious side effects to an unborn child. Talk to your health care professional or  pharmacist for more information. Check with your doctor or health care professional if you get an attack of severe diarrhea, nausea and vomiting, or if you sweat a lot. The loss of too much body fluid can make it dangerous for you to  take this medicine. You may get drowsy or dizzy. Do not drive, use machinery, or do anything that needs mental alertness until you know how this drug affects you. Do not stand or sit up quickly, especially if you are an older patient. This reduces the risk of dizzy or fainting spells. Alcohol can make you more drowsy and dizzy. Avoid alcoholic drinks. Avoid salt substitutes unless you are told otherwise by your doctor or health care professional. What side effects may I notice from receiving this medicine? Side effects that you should report to your doctor or health care professional as soon as possible: -allergic reactions like skin rash, itching or hives, swelling of the hands, feet, face, lips, throat, or tongue -breathing problems -signs and symptoms of kidney injury like trouble passing urine or change in the amount of urine -signs and symptoms of increased potassium like muscle weakness; chest pain; or fast, irregular heartbeat -signs and symptoms of liver injury like dark yellow or brown urine; general ill feeling or flu-like symptoms; light-colored stools; loss of appetite; nausea; right upper belly pain; unusually weak or tired; yellowing of the eyes or skin -signs and symptoms of low blood pressure like dizziness; feeling faint or lightheaded, falls; unusually weak or tired -stomach pain with or without nausea and vomiting Side effects that usually do not require medical attention (report to your doctor or health care professional if they continue or are bothersome): -changes in taste -cough -dizziness -fever -headache -sensitivity to light This list may not describe all possible side effects. Call your doctor for medical advice about side effects. You may report side effects to FDA at 1-800-FDA-1088. Where should I keep my medicine? Keep out of the reach of children. Store at room temperature between 15 and 30 degrees C (59 and 86 degrees F). Protect from moisture. Keep container  tightly closed. Throw away any unused medicine after the expiration date. NOTE: This sheet is a summary. It may not cover all possible information. If you have questions about this medicine, talk to your doctor, pharmacist, or health care provider.  2018 Elsevier/Gold Standard (2016-01-13 12:52:35)

## 2018-10-19 NOTE — Progress Notes (Signed)
Benjamin Bryan is a 84 y.o. male who presents to [facility] today for follow-up pneumonia and pulmonary embolism.  Benjamin Bryan was hospitalized recently for community acquired pneumonia and subsequently found to have small peripheral pulmonary embolism on CT scan while in hospital.  He was treated with IV antibiotics and discharged with home doxycycline.  He is completed the oral doxycycline course.  Additionally he was started on Eliquis during his hospitalization.  He tolerates medications well and feels a lot better.  He notes considerable improvement in symptoms.  No significant shortness of breath fevers chills nausea vomiting or diarrhea.  He notes at home his blood pressures have been running a bit high.  He does pressures in the 150s 160s.  He takes amlodipine 5 mg twice daily. ROS as above:  Exam:  BP (!) 142/63   Pulse 98   Wt 141 lb (64 kg)   SpO2 100%   BMI 23.46 kg/m  Wt Readings from Last 5 Encounters:  10/19/18 141 lb (64 kg)  09/28/18 149 lb (67.6 kg)  07/26/18 144 lb (65.3 kg)  01/26/18 154 lb (69.9 kg)  07/26/17 151 lb (68.5 kg)    Gen: Well NAD HEENT: EOMI,  MMM Lungs: Normal work of breathing. CTABL Heart: RRR no MRG Abd: NABS, Soft. Nondistended, Nontender Exts: Brisk capillary refill, warm and well perfused.   Lab and Radiology Results No results found for this or any previous visit (from the past 72 hour(s)). No results found.    Assessment and Plan: 84 y.o. male with  Community-acquired pneumonia considerable improvement.  Watchful waiting recheck in about 3 weeks we will repeat chest x-ray at that time.  Pulmonary embolisms: Continue Eliquis for total of 6 months.  Hypertension: Add lisinopril at bedtime.  Recheck in about 3 weeks.  Will check basic labs at that time.   No orders of the defined types were placed in this  encounter.  Meds ordered this encounter  Medications  . ELIQUIS 5 MG TABS tablet    Sig: Take 1 tablet (5 mg total) by mouth 2 (two) times daily.    Dispense:  60 tablet    Refill:  5     Historical information moved to improve visibility of documentation.  Past Medical History:  Diagnosis Date  . Hyperlipidemia   . Hypertension   . Shingles   . Thyroid disease    Past Surgical History:  Procedure Laterality Date  . radical prostectomy  1994  . removed section of intestine  08/24/2005   polyp   Social History   Tobacco Use  . Smoking status: Former Smoker    Last attempt to quit: 12/11/1984    Years since quitting: 33.8  . Smokeless tobacco: Never Used  Substance Use Topics  . Alcohol use: Yes    Alcohol/week: 18.0 - 20.0 standard drinks    Types: 18 - 20 Standard drinks or equivalent per week   family history includes Prostate cancer in his father; Stroke in his father.  Medications: Current Outpatient Medications  Medication Sig Dispense Refill  . amLODipine (NORVASC) 5 MG tablet TAKE 1 TABLET BY MOUTH TWICE DAILY 180 tablet 0  . aspirin 81 MG tablet Take 81 mg by mouth daily.    Marland Kitchen atorvastatin (LIPITOR) 10 MG tablet TAKE 1 TABLET BY MOUTH ONCE DAILY 90 tablet 0  . benzonatate (TESSALON) 200 MG capsule Take 1 capsule (200 mg total) by mouth 3 (  three) times daily as needed for cough. 45 capsule 0  . ELIQUIS 5 MG TABS tablet Take 1 tablet (5 mg total) by mouth 2 (two) times daily. 60 tablet 5  . famotidine (PEPCID) 20 MG tablet Take 20 mg by mouth daily.    . Glucosamine-Chondroitin 1500-1200 MG/30ML LIQD Take by mouth. Take two tablets per day    . levothyroxine (SYNTHROID, LEVOTHROID) 75 MCG tablet Take 1 tablet (75 mcg total) by mouth daily. 90 tablet 2   No current facility-administered medications for this visit.    No Known Allergies   Discussed warning signs or symptoms. Please see discharge instructions. Patient expresses understanding.

## 2018-11-09 ENCOUNTER — Ambulatory Visit (INDEPENDENT_AMBULATORY_CARE_PROVIDER_SITE_OTHER): Payer: Medicare Other | Admitting: Family Medicine

## 2018-11-09 VITALS — BP 157/80 | HR 86 | Wt 140.0 lb

## 2018-11-09 DIAGNOSIS — I10 Essential (primary) hypertension: Secondary | ICD-10-CM

## 2018-11-09 DIAGNOSIS — I2694 Multiple subsegmental pulmonary emboli without acute cor pulmonale: Secondary | ICD-10-CM | POA: Diagnosis not present

## 2018-11-09 DIAGNOSIS — R748 Abnormal levels of other serum enzymes: Secondary | ICD-10-CM | POA: Diagnosis not present

## 2018-11-09 DIAGNOSIS — D649 Anemia, unspecified: Secondary | ICD-10-CM | POA: Diagnosis not present

## 2018-11-09 DIAGNOSIS — I700 Atherosclerosis of aorta: Secondary | ICD-10-CM | POA: Diagnosis not present

## 2018-11-09 DIAGNOSIS — E039 Hypothyroidism, unspecified: Secondary | ICD-10-CM | POA: Diagnosis not present

## 2018-11-09 DIAGNOSIS — I708 Atherosclerosis of other arteries: Secondary | ICD-10-CM

## 2018-11-09 NOTE — Progress Notes (Signed)
Benjamin Bryan is a 84 y.o. male who presents to [place]: [place] today for follow-up hypertension, anticoagulation, and pneumonia.  Benjamin Bryan was seen last on November 13 following a hospital discharge in late October for community-acquired pneumonia.  While in the hospital being worked up for his community-acquired pneumonia he had work-up also for an elevated d-dimer with a CT angiogram found to have several small scattered pulmonary embolisms.  He was treated anticoagulation for the pulmonary [place] and effective antibiotics for the pneumonia.  On follow-up on 13 November he was feeling a lot better and tolerating Eliquis quite well.  He had completed his antibiotic course.  Additionally he was found to have elevated blood pressures.  He had been taking amlodipine 5 mg twice daily for years.  At the last visit I started 5 mg of lisinopril at bedtime to help control his blood pressure.  He notes that he developed hypotension with blood pressures at around 100/50 the following day after first taking lisinopril and felt lightheaded and dizzy.  He stopped taking it.  He felt much better.  Over the last week or so he is noticed some continued mild low blood pressure on just amlodipine 5 mg twice daily and for the last day or 2 has been taking it just at night and notes this is helped quite a bit. He measures his blood pressure at home typically around 130s over 80s..   ROS as above:  Exam:  BP (!) 157/80   Pulse 86   Wt 140 lb (63.5 kg)   BMI 23.30 kg/m  Wt Readings from Last 5 Encounters:  11/09/18 140 lb (63.5 kg)  10/19/18 141 lb (64 kg)  09/28/18 149 lb (67.6 kg)  07/26/18 144 lb (65.3 kg)  01/26/18 154 lb (69.9 kg)    Gen: Well NAD HEENT: EOMI,  MMM Lungs: Normal work of breathing. CTABL Heart: RRR no MRG Abd: NABS, Soft. Nondistended, Nontender Exts: Brisk capillary  refill, warm and well perfused.   Lab and Radiology Results  No results found.    Assessment and Plan: 84 y.o. male with  Hypertension: I think patient is at a reasonable course of medication with amlodipine once or twice daily.  Continue current regimen and check metabolic panel.  Anticoagulation: Tolerating Eliquis well.  Plan to recheck CBC and iron stores.  Patient had mild low hemoglobin leave the hospital.  Community-acquired pneumonia: Clear doing quite well.  His original x-ray upon presentation to my office on October 23 showed community-acquired pneumonia and radiology recommended recheck x-ray in about a month.  However he had a CT scan in the hospital that did not show any concern for malignancy therefore I do not see the need for repeat chest x-ray at this time.  He is clinically doing quite well from a community-acquired pneumonia perspective.  Plan to check labs recheck in 1 to 3 months sooner if needed.   Orders Placed This Encounter  Procedures  . CBC with Differential/Platelet  . COMPLETE METABOLIC PANEL WITH GFR  . TSH  . Fe+TIBC+Fer   No orders of the defined types were placed in this encounter.    Historical information moved to improve visibility of documentation.  Past Medical History:  Diagnosis Date  . Hyperlipidemia   . Hypertension   . Shingles   . Thyroid disease    Past Surgical History:  Procedure Laterality Date  . radical prostectomy  1994  . removed  section of intestine  08/24/2005   polyp   Social History   Tobacco Use  . Smoking status: Former Smoker    Last attempt to quit: 12/11/1984    Years since quitting: 33.9  . Smokeless tobacco: Never Used  Substance Use Topics  . Alcohol use: Yes    Alcohol/week: 18.0 - 20.0 standard drinks    Types: 18 - 20 Standard drinks or equivalent per week   family history includes Prostate cancer in his father; Stroke in his father.  Medications: Current Outpatient Medications  Medication  Sig Dispense Refill  . amLODipine (NORVASC) 5 MG tablet TAKE 1 TABLET BY MOUTH TWICE DAILY 180 tablet 0  . aspirin 81 MG tablet Take 81 mg by mouth daily.    Marland Kitchen atorvastatin (LIPITOR) 10 MG tablet TAKE 1 TABLET BY MOUTH ONCE DAILY 90 tablet 0  . benzonatate (TESSALON) 200 MG capsule Take 1 capsule (200 mg total) by mouth 3 (three) times daily as needed for cough. 45 capsule 0  . ELIQUIS 5 MG TABS tablet Take 1 tablet (5 mg total) by mouth 2 (two) times daily. 60 tablet 5  . famotidine (PEPCID) 20 MG tablet Take 20 mg by mouth daily.    . Glucosamine-Chondroitin 1500-1200 MG/30ML LIQD Take by mouth. Take two tablets per day    . levothyroxine (SYNTHROID, LEVOTHROID) 75 MCG tablet Take 1 tablet (75 mcg total) by mouth daily. 90 tablet 2   No current facility-administered medications for this visit.    No Known Allergies   Discussed warning signs or symptoms. Please see discharge instructions. Patient expresses understanding.

## 2018-11-09 NOTE — Patient Instructions (Addendum)
Thank you for coming in today. Take 1 of the amlodipine at bedtime only.  Get labs today.  STOP lisinopril as you have done.   Recheck in 3 months or sooner if needed.   Dizziness Dizziness is a common problem. It makes you feel unsteady or light-headed. You may feel like you are about to pass out (faint). Dizziness can lead to getting hurt if you stumble or fall. Dizziness can be caused by many things, including:  Medicines.  Not having enough water in your body (dehydration).  Illness.  Follow these instructions at home: Eating and drinking  Drink enough fluid to keep your pee (urine) clear or pale yellow. This helps to keep you from getting dehydrated. Try to drink more clear fluids, such as water.  Do not drink alcohol.  Limit how much caffeine you drink or eat, if your doctor tells you to do that.  Limit how much salt (sodium) you drink or eat, if your doctor tells you to do that. Activity  Avoid making quick movements. ? When you stand up from sitting in a chair, steady yourself until you feel okay. ? In the morning, first sit up on the side of the bed. When you feel okay, stand slowly while you hold onto something. Do this until you know that your balance is fine.  If you need to stand in one place for a long time, move your legs often. Tighten and relax the muscles in your legs while you are standing.  Do not drive or use heavy machinery if you feel dizzy.  Avoid bending down if you feel dizzy. Place items in your home so you can reach them easily without leaning over. Lifestyle  Do not use any products that contain nicotine or tobacco, such as cigarettes and e-cigarettes. If you need help quitting, ask your doctor.  Try to lower your stress level. You can do this by using methods such as yoga or meditation. Talk with your doctor if you need help. General instructions  Watch your dizziness for any changes.  Take over-the-counter and prescription medicines only as  told by your doctor. Talk with your doctor if you think that you are dizzy because of a medicine that you are taking.  Tell a friend or a family member that you are feeling dizzy. If he or she notices any changes in your behavior, have this person call your doctor.  Keep all follow-up visits as told by your doctor. This is important. Contact a doctor if:  Your dizziness does not go away.  Your dizziness or light-headedness gets worse.  You feel sick to your stomach (nauseous).  You have trouble hearing.  You have new symptoms.  You are unsteady on your feet.  You feel like the room is spinning. Get help right away if:  You throw up (vomit) or have watery poop (diarrhea), and you cannot eat or drink anything.  You have trouble: ? Talking. ? Walking. ? Swallowing. ? Using your arms, hands, or legs.  You feel generally weak.  You are not thinking clearly, or you have trouble forming sentences. A friend or family member may notice this.  You have: ? Chest pain. ? Pain in your belly (abdomen). ? Shortness of breath. ? Sweating.  Your vision changes.  You are bleeding.  You have a very bad headache.  You have neck pain or a stiff neck.  You have a fever. These symptoms may be an emergency. Do not wait to see if  the symptoms will go away. Get medical help right away. Call your local emergency services (911 in the [place] Do not drive yourself to the hospital. Summary  Dizziness makes you feel unsteady or light-headed. You may feel like you are about to pass out (faint).  Drink enough fluid to keep your pee (urine) clear or pale yellow. Do not drink alcohol.  Avoid making quick movements if you feel dizzy.  Watch your dizziness for any changes. This information is not intended to replace advice given to you by your health care provider. Make sure you discuss any questions you have with your health care provider. Document Released: 11/12/2011 Document Revised:  12/10/2016 Document Reviewed: 12/10/2016 Elsevier Interactive Patient Education  2017 [employer].

## 2018-11-10 LAB — COMPLETE METABOLIC PANEL WITH GFR
AG Ratio: 1.3 (calc) (ref 1.0–2.5)
ALBUMIN MSPROF: 3.7 g/dL (ref 3.6–5.1)
ALKALINE PHOSPHATASE (APISO): 87 U/L (ref 40–115)
ALT: 12 U/L (ref 9–46)
AST: 16 U/L (ref 10–35)
BUN/Creatinine Ratio: 11 (calc) (ref 6–22)
BUN: 21 mg/dL (ref 7–25)
CALCIUM: 9.2 mg/dL (ref 8.6–10.3)
CO2: 26 mmol/L (ref 20–32)
CREATININE: 1.86 mg/dL — AB (ref 0.70–1.11)
Chloride: 100 mmol/L (ref 98–110)
GFR, EST NON AFRICAN AMERICAN: 32 mL/min/{1.73_m2} — AB (ref >=60)
GFR, Est African American: 37 mL/min/{1.73_m2} — ABNORMAL LOW (ref >=60)
GLOBULIN: 2.9 g/dL (ref 1.9–3.7)
Glucose, Bld: 110 mg/dL — ABNORMAL HIGH (ref 65–99)
Potassium: 4.2 mmol/L (ref 3.5–5.3)
SODIUM: 136 mmol/L (ref 135–146)
Total Bilirubin: 0.5 mg/dL (ref 0.2–1.2)
Total Protein: 6.6 g/dL (ref 6.1–8.1)

## 2018-11-10 LAB — CBC WITH DIFFERENTIAL/PLATELET
BASOS ABS: 84 {cells}/uL (ref 0–200)
Basophils Relative: 1 %
EOS PCT: 0.6 %
Eosinophils Absolute: 50 cells/uL (ref 15–500)
HEMATOCRIT: 32.8 % — AB (ref 38.5–50.0)
HEMOGLOBIN: 11 g/dL — AB (ref 13.2–17.1)
LYMPHS ABS: 1462 {cells}/uL (ref 850–3900)
MCH: 30.2 pg (ref 27.0–33.0)
MCHC: 33.5 g/dL (ref 32.0–36.0)
MCV: 90.1 fL (ref 80.0–100.0)
MPV: 10 fL (ref 7.5–12.5)
Monocytes Relative: 9.8 %
NEUTROS ABS: 5981 {cells}/uL (ref 1500–7800)
Neutrophils Relative %: 71.2 %
Platelets: 429 10*3/uL — ABNORMAL HIGH (ref 140–400)
RBC: 3.64 10*6/uL — AB (ref 4.20–5.80)
RDW: 12.8 % (ref 11.0–15.0)
Total Lymphocyte: 17.4 %
WBC: 8.4 10*3/uL (ref 3.8–10.8)
WBCMIX: 823 {cells}/uL (ref 200–950)

## 2018-11-10 LAB — IRON,TIBC AND FERRITIN PANEL
%SAT: 14 % — AB (ref 20–48)
FERRITIN: 46 ng/mL (ref 24–380)
IRON: 41 ug/dL — AB (ref 50–180)
TIBC: 295 mcg/dL (calc) (ref 250–425)

## 2018-11-10 LAB — TSH: TSH: 5.26 m[IU]/L — AB (ref 0.40–4.50)

## 2018-11-23 NOTE — Progress Notes (Signed)
Subjective:   Benjamin Bryan is a 84 y.o. male who presents for an Initial Medicare Annual Wellness Visit.  Review of Systems  No ROS.  Medicare Wellness Visit. Additional risk factors are reflected in the social history.  Cardiac Risk Factors include: advanced age (>59men, >1 women);male gender;hypertension;dyslipidemia;sedentary lifestyle  Sleep patterns: Getting 11 hours of sleep a night. Wakes up feeling rested. Wakes up 3 times a night to go to the bathroom  Home Safety/Smoke Alarms: Feels safe in home. Smoke alarms in place.  Living environment; Lives with wife in 1 story home, no steps in the home. Shower is a walk in tub and no grab bars in place Seat Belt Safety/Bike Helmet: Wears seat belt.   Male:   CCS-  Aged out   PSA- utd Lab Results  Component Value Date   PSA <0.1 01/26/2018       Objective:    Today's Vitals   11/28/18 1031  BP: (!) 145/76  Pulse: 84  SpO2: 100%  Weight: 139 lb (63 kg)  Height: 5\' 5"  (1.651 m)   Body mass index is 23.13 kg/m.  Advanced Directives 11/28/2018 09/11/2015  Does Patient Have a Medical Advance Directive? No No  Would patient like information on creating a medical advance directive? No - Patient declined No - patient declined information    Current Medications (verified) Outpatient Encounter Medications as of 11/28/2018  Medication Sig  . aspirin 81 MG tablet Take 81 mg by mouth daily.  Marland Kitchen atorvastatin (LIPITOR) 10 MG tablet TAKE 1 TABLET BY MOUTH ONCE DAILY  . ELIQUIS 5 MG TABS tablet Take 1 tablet (5 mg total) by mouth 2 (two) times daily.  . famotidine (PEPCID) 20 MG tablet Take 20 mg by mouth daily.  . Glucosamine-Chondroitin 1500-1200 MG/30ML LIQD Take by mouth. Take two tablets per day  . levothyroxine (SYNTHROID, LEVOTHROID) 75 MCG tablet Take 1 tablet (75 mcg total) by mouth daily.  Marland Kitchen amLODipine (NORVASC) 5 MG tablet TAKE 1 TABLET BY MOUTH TWICE DAILY (Patient not taking: Reported on 11/28/2018)  .  [DISCONTINUED] benzonatate (TESSALON) 200 MG capsule Take 1 capsule (200 mg total) by mouth 3 (three) times daily as needed for cough.   No facility-administered encounter medications on file as of 11/28/2018.     Allergies (verified) Patient has no known allergies.   History: Past Medical History:  Diagnosis Date  . Hyperlipidemia   . Hypertension   . Shingles   . Thyroid disease    Past Surgical History:  Procedure Laterality Date  . radical prostectomy  1994  . removed section of intestine  08/24/2005   polyp   Family History  Problem Relation Age of Onset  . Prostate cancer Father   . Stroke Father    Social History   Socioeconomic History  . Marital status: Married    Spouse name: Romie Minus  . Number of children: 1  . Years of education: 83  . Highest education level: 12th grade  Occupational History  . Occupation: retired    Comment: Facilities manager Needs  . Financial resource strain: Not hard at all  . Food insecurity:    Worry: Never true    Inability: Never true  . Transportation needs:    Medical: No    Non-medical: No  Tobacco Use  . Smoking status: Former Smoker    Last attempt to quit: 12/11/1984    Years since quitting: 33.9  . Smokeless tobacco: Never Used  Substance and Sexual Activity  .  Alcohol use: Not Currently  . Drug use: No  . Sexual activity: Never    Partners: Female  Lifestyle  . Physical activity:    Days per week: 0 days    Minutes per session: 0 min  . Stress: Not at all  Relationships  . Social connections:    Talks on phone: Once a week    Gets together: Once a week    Attends religious service: Never    Active member of club or organization: No    Attends meetings of clubs or organizations: Never    Relationship status: Married  Other Topics Concern  . Not on file  Social History Narrative   Drinks caffeine during the day- coffee. Will go walking in neighborhood when the weather is decent.   Tobacco  Counseling Counseling given: Not Answered   Clinical Intake:  Pre-visit preparation completed: Yes  Pain : No/denies pain     Nutritional Risks: None Diabetes: No  How often do you need to have someone help you when you read instructions, pamphlets, or other written materials from your doctor or pharmacy?: 1 - Never What is the last grade level you completed in school?: 12  Interpreter Needed?: No  Information entered by :: Orlie Dakin, LPN  Activities of Daily Living In your present state of health, do you have any difficulty performing the following activities: 11/28/2018 10/19/2018  Hearing? Y Y  Comment going to have ears checked at ear doc. cant hear out of right ear  Vision? N N  Difficulty concentrating or making decisions? Y Y  Comment trouble with remembering short term -  Walking or climbing stairs? N Y  Dressing or bathing? N N  Doing errands, shopping? N N  Preparing Food and eating ? N -  Using the Toilet? N -  In the past six months, have you accidently leaked urine? Y -  Comment occasionally will notice in underwear where he has leaked some urine but not all the time. -  Do you have problems with loss of bowel control? N -  Managing your Medications? N -  Managing your Finances? N -  Housekeeping or managing your Housekeeping? N -  Some recent data might be hidden     Immunizations and Health Maintenance Immunization History  Administered Date(s) Administered  . Influenza, High Dose Seasonal PF 09/14/2017, 08/29/2018  . Influenza-Unspecified 08/27/2015, 09/06/2016  . Pneumococcal Conjugate-13 09/16/2016  . Pneumococcal Polysaccharide-23 01/26/2018  . Tdap 01/26/2018   There are no preventive care reminders to display for this patient.  Patient Care Team: Gregor Hams, MD as PCP - General (Family Medicine)  Indicate any recent Medical Services you may have received from other than [facility] providers in the past year (date may be approximate).     Assessment:   This is a routine wellness examination for Rufus.Physical assessment deferred to PCP.   Hearing/Vision screen  Visual Acuity Screening   Right eye Left eye Both eyes  Without correction:     With correction: 20/40 20/40 20/30   Hearing Screening Comments: Whisper test done patient could not report any of the 3 words. Almost deaf in right ear. But is gonna have that looked at in 2020.  Dietary issues and exercise activities discussed: Current Exercise Habits: The patient does not participate in regular exercise at present, Exercise limited by: respiratory conditions(s) Diet Reports eating a healthy diet of vegetables, fruits and protein and dairy Breakfast: goes out for breakfast or has cereal. Lunch: sandwich  or snack Dinner:  Meat and a vegetable if hungry at that time of day     Goals    . Exercise 3x per week (30 min per time)     Increase exercise during the week.      Depression Screen PHQ 2/9 Scores 11/28/2018 07/26/2018 03/22/2017  PHQ - 2 Score 0 0 0    Fall Risk Fall Risk  11/28/2018 07/26/2018  Falls in the past year? 0 No    Is the patient's home free of loose throw rugs in walkways, pet beds, electrical cords, etc?   yes      Grab bars in the bathroom? no      Handrails on the stairs?   no      Adequate lighting?   yes   Cognitive Function:     6CIT Screen 11/28/2018  What Year? 0 points  What month? 0 points  What time? 0 points  Count back from 20 0 points  Months in reverse 0 points  Repeat phrase 2 points  Total Score 2    Screening Tests Health Maintenance  Topic Date Due  . TETANUS/TDAP  01/27/2028  . INFLUENZA VACCINE  Completed  . PNA vac Low Risk Adult  Completed        Plan:      Mr. Valbuena , Thank you for taking time to come for your Medicare Wellness Visit. I appreciate your ongoing commitment to your health goals. Please review the following plan we discussed and let me know if I can assist you in the future.    Please schedule your next medicare wellness visit with me in 1 yr. Continue doing brain stimulating activities (puzzles, reading, adult coloring books, staying active) to keep memory sharp.    These are the goals we discussed: Goals    . Exercise 3x per week (30 min per time)     Increase exercise during the week.       This is a list of the screening recommended for you and due dates:  Health Maintenance  Topic Date Due  . Tetanus Vaccine  01/27/2028  . Flu Shot  Completed  . Pneumonia vaccines  Completed     I have personally reviewed and noted the following in the patient's chart:   . Medical and social history . Use of alcohol, tobacco or illicit drugs  . Current medications and supplements . Functional ability and status . Nutritional status . Physical activity . Advanced directives . List of other physicians . Hospitalizations, surgeries, and ER visits in previous 12 months . Vitals . Screenings to include cognitive, depression, and falls . Referrals and appointments  In addition, I have reviewed and discussed with patient certain preventive protocols, quality metrics, and best practice recommendations. A written personalized care plan for preventive services as well as general preventive health recommendations were provided to patient.     Joanne Chars, LPN   [id number]

## 2018-11-28 ENCOUNTER — Ambulatory Visit (INDEPENDENT_AMBULATORY_CARE_PROVIDER_SITE_OTHER): Payer: Medicare Other | Admitting: *Deleted

## 2018-11-28 VITALS — BP 145/76 | HR 84 | Ht 65.0 in | Wt 139.0 lb

## 2018-11-28 DIAGNOSIS — Z0000 Encounter for general adult medical examination without abnormal findings: Secondary | ICD-10-CM

## 2018-11-28 NOTE — Patient Instructions (Signed)
Benjamin Bryan , Thank you for taking time to come for your Medicare Wellness Visit. I appreciate your ongoing commitment to your health goals. Please review the following plan we discussed and let me know if I can assist you in the future.   Please schedule your next medicare wellness visit with me in 1 yr. Continue doing brain stimulating activities (puzzles, reading, adult coloring books, staying active) to keep memory sharp.   These are the goals we discussed: Goals    . Exercise 3x per week (30 min per time)     Increase exercise during the week.

## 2018-12-09 ENCOUNTER — Encounter: Payer: Self-pay | Admitting: Family Medicine

## 2018-12-09 ENCOUNTER — Ambulatory Visit (INDEPENDENT_AMBULATORY_CARE_PROVIDER_SITE_OTHER): Payer: Medicare Other | Admitting: Family Medicine

## 2018-12-09 VITALS — BP 160/77 | HR 88 | Wt 141.0 lb

## 2018-12-09 DIAGNOSIS — D500 Iron deficiency anemia secondary to blood loss (chronic): Secondary | ICD-10-CM | POA: Diagnosis not present

## 2018-12-09 DIAGNOSIS — I10 Essential (primary) hypertension: Secondary | ICD-10-CM

## 2018-12-09 DIAGNOSIS — E039 Hypothyroidism, unspecified: Secondary | ICD-10-CM

## 2018-12-09 DIAGNOSIS — I129 Hypertensive chronic kidney disease with stage 1 through stage 4 chronic kidney disease, or unspecified chronic kidney disease: Secondary | ICD-10-CM | POA: Diagnosis not present

## 2018-12-09 DIAGNOSIS — N183 Chronic kidney disease, stage 3 (moderate): Secondary | ICD-10-CM

## 2018-12-09 DIAGNOSIS — D509 Iron deficiency anemia, unspecified: Secondary | ICD-10-CM | POA: Insufficient documentation

## 2018-12-09 HISTORY — DX: Iron deficiency anemia, unspecified: D50.9

## 2018-12-09 NOTE — Patient Instructions (Addendum)
Thank you for coming in today. Get labs today.  Send back stool test.   Recheck with me on March 4th as scheduled.

## 2018-12-09 NOTE — Progress Notes (Signed)
Benjamin Bryan is a 85 y.o. male who presents to [facility]: [place] today for follow-up increased creatinine/hypertension.  Additionally follow-up mild anemia.  [name] has a history of hypertension and mild chronic kidney disease.  His labs were checked in December and his creatinine had increased from his baseline of about 1.4 up to about 1.8.  He continues to take amlodipine twice daily 5 mg.  He notes at higher doses and with other medications for blood pressures had significant lightheadedness and dizziness and not tolerated it well.  Additionally he was found to have worsening anemia with a hemoglobin down to 11.0 down from his baseline of about 13.4 in May.  He had a pulmonary embolism a few months ago and is currently on Eliquis.  He denies any blood in the stool.  He is not currently taking any iron supplementation.  His iron stores were low as well with a percent saturation of 14 and a ferritin of 46.   ROS as above:  Exam:  BP (!) 160/77   Pulse 88   Wt 141 lb (64 kg)   BMI 23.46 kg/m  Wt Readings from Last 5 Encounters:  12/09/18 141 lb (64 kg)  11/28/18 139 lb (63 kg)  11/09/18 140 lb (63.5 kg)  10/19/18 141 lb (64 kg)  09/28/18 149 lb (67.6 kg)    Gen: Well NAD HEENT: EOMI,  MMM Lungs: Normal work of breathing. CTABL Heart: RRR no MRG Abd: NABS, Soft. Nondistended, Nontender Exts: Brisk capillary refill, warm and well perfused.   Lab and Radiology Results Results for Benjamin, Bryan (MRN [id number]) as of 12/09/2018 11:01  Ref. Range 09/28/2018 15:20 11/09/2018 15:26  Sodium Latest Ref Range: 135 - 146 mmol/L 134 (L) 136  Potassium Latest Ref Range: 3.5 - 5.3 mmol/L 4.5 4.2  Chloride Latest Ref Range: 98 - 110 mmol/L 99 100  CO2 Latest Ref Range: 20 - 32 mmol/L 23 26  Glucose Latest Ref Range: 65 - 99 mg/dL 123 (H) 110 (H)  BUN Latest Ref Range: 7 - 25  mg/dL 19 21  Creatinine Latest Ref Range: 0.70 - 1.11 mg/dL 1.51 (H) 1.86 (H)  Calcium Latest Ref Range: 8.6 - 10.3 mg/dL 8.8 9.2  BUN/Creatinine Ratio Latest Ref Range: 6 - 22 (calc) 13 11  AG Ratio Latest Ref Range: 1.0 - 2.5 (calc) 1.4 1.3  AST Latest Ref Range: 10 - 35 U/L 56 (H) 16  ALT Latest Ref Range: 9 - 46 U/L 47 (H) 12  Total Protein Latest Ref Range: 6.1 - 8.1 g/dL 6.0 (L) 6.6  Total Bilirubin Latest Ref Range: 0.2 - 1.2 mg/dL 1.1 0.5  GFR, Est Non African American Latest Ref Range: > OR = 60 mL/min/1.42m2 41 (L) 32 (L)  GFR, Est African American Latest Ref Range: > OR = 60 mL/min/1.93m2 47 (L) 37 (L)  Iron Latest Ref Range: 50 - 180 mcg/dL  41 (L)  TIBC Latest Ref Range: 250 - 425 mcg/dL (calc)  295  %SAT Latest Ref Range: 20 - 48 % (calc)  14 (L)  Ferritin Latest Ref Range: 24 - 380 ng/mL  46  Alkaline phosphatase (APISO) Latest Ref Range: 40 - 115 U/L 131 (H) 87  Globulin Latest Ref Range: 1.9 - 3.7 g/dL (calc) 2.5 2.9  WBC Latest Ref Range: 3.8 - 10.8 Thousand/uL 14.6 (H) 8.4  WBC mixed population Latest Ref Range: 200 - 950 cells/uL 2,044 (H) 823  RBC Latest Ref Range: 4.20 - 5.80 Million/uL 3.61 (L) 3.64 (L)  Hemoglobin Latest Ref Range: 13.2 - 17.1 g/dL 11.3 (L) 11.0 (L)  HCT Latest Ref Range: 38.5 - 50.0 % 33.0 (L) 32.8 (L)  MCV Latest Ref Range: 80.0 - 100.0 fL 91.4 90.1  MCH Latest Ref Range: 27.0 - 33.0 pg 31.3 30.2  MCHC Latest Ref Range: 32.0 - 36.0 g/dL 34.2 33.5  RDW Latest Ref Range: 11.0 - 15.0 % 11.8 12.8  Platelets Latest Ref Range: 140 - 400 Thousand/uL 436 (H) 429 (H)  MPV Latest Ref Range: 7.5 - 12.5 fL 10.9 10.0  Neutrophils Latest Units: % 79.2 71.2  Monocytes Relative Latest Units: % 14.0 9.8  Eosinophil Latest Units: % 0.1 0.6  Basophil Latest Units: % 0.3 1.0  NEUT# Latest Ref Range: 1,500 - 7,800 cells/uL 11,563 (H) 5,981  Lymphocyte # Latest Ref Range: 850 - 3,900 cells/uL 934 1,462  Total Lymphocyte Latest Units: % 6.4 17.4  Eosinophils  Absolute Latest Ref Range: 15 - 500 cells/uL 15 50  Basophils Absolute Latest Ref Range: 0 - 200 cells/uL 44 84  TSH Latest Ref Range: 0.40 - 4.50 mIU/L  5.26 (H)  Albumin MSPROF Latest Ref Range: 3.6 - 5.1 g/dL 3.5 (L) 3.7      Assessment and Plan: 85 y.o. male with  Elevated creatinine.  Likely worsening of chronic kidney disease.  However this could be a simple transient elevation.  Plan to recheck metabolic panel today.  If still elevated or worsening will refer to nephrology.    Hypertension: Blood pressures not ideally controlled today.  However patient is at his max tolerated dose.  He has trouble tolerating higher doses of blood pressure medication due to lightheadedness and dizziness.  Anemia and decreased iron stores.  Patient has had worsening iron stores since starting Eliquis.  I believe he is probably has small amount of blood loss in his stool.  I plan to proceed with stool card Hemoccult testing to confirm this and then start oral iron supplementation.  TSH was also a bit elevated.  Plan to recheck and if still elevated increase levothyroxine.  PDMP not reviewed this encounter. Orders Placed This Encounter  Procedures  . BASIC METABOLIC PANEL WITH GFR  . CBC  . TSH   No orders of the defined types were placed in this encounter.    Historical information moved to improve visibility of documentation.  Past Medical History:  Diagnosis Date  . Hyperlipidemia   . Hypertension   . Shingles   . Thyroid disease    Past Surgical History:  Procedure Laterality Date  . radical prostectomy  1994  . removed section of intestine  08/24/2005   polyp   Social History   Tobacco Use  . Smoking status: Former Smoker    Last attempt to quit: 12/11/1984    Years since quitting: 34.0  . Smokeless tobacco: Never Used  Substance Use Topics  . Alcohol use: Not Currently   family history includes Prostate cancer in his father; Stroke in his father.  Medications: Current  Outpatient Medications  Medication Sig Dispense Refill  . amLODipine (NORVASC) 5 MG tablet TAKE 1 TABLET BY MOUTH TWICE DAILY 180 tablet 0  . aspirin 81 MG tablet Take 81 mg by mouth daily.    Marland Kitchen atorvastatin (LIPITOR) 10 MG tablet TAKE 1 TABLET BY MOUTH ONCE DAILY 90 tablet 0  . ELIQUIS 5 MG TABS tablet Take 1 tablet (5 mg total) by mouth 2 (  two) times daily. 60 tablet 5  . famotidine (PEPCID) 20 MG tablet Take 20 mg by mouth daily.    . Glucosamine-Chondroitin 1500-1200 MG/30ML LIQD Take by mouth. Take two tablets per day    . levothyroxine (SYNTHROID, LEVOTHROID) 75 MCG tablet Take 1 tablet (75 mcg total) by mouth daily. 90 tablet 2   No current facility-administered medications for this visit.    No Known Allergies   Discussed warning signs or symptoms. Please see discharge instructions. Patient expresses understanding.

## 2018-12-10 LAB — CBC
HCT: 35 % — ABNORMAL LOW (ref 38.5–50.0)
Hemoglobin: 11.3 g/dL — ABNORMAL LOW (ref 13.2–17.1)
MCH: 29 pg (ref 27.0–33.0)
MCHC: 32.3 g/dL (ref 32.0–36.0)
MCV: 90 fL (ref 80.0–100.0)
MPV: 10.4 fL (ref 7.5–12.5)
Platelets: 420 10*3/uL — ABNORMAL HIGH (ref 140–400)
RBC: 3.89 10*6/uL — ABNORMAL LOW (ref 4.20–5.80)
RDW: 12.7 % (ref 11.0–15.0)
WBC: 7.8 10*3/uL (ref 3.8–10.8)

## 2018-12-10 LAB — BASIC METABOLIC PANEL WITH GFR
BUN/Creatinine Ratio: 14 (calc) (ref 6–22)
BUN: 21 mg/dL (ref 7–25)
CALCIUM: 9.3 mg/dL (ref 8.6–10.3)
CO2: 25 mmol/L (ref 20–32)
CREATININE: 1.51 mg/dL — AB (ref 0.70–1.11)
Chloride: 104 mmol/L (ref 98–110)
GFR, EST AFRICAN AMERICAN: 47 mL/min/{1.73_m2} — AB (ref >=60)
GFR, EST NON AFRICAN AMERICAN: 41 mL/min/{1.73_m2} — AB (ref >=60)
Glucose, Bld: 101 mg/dL — ABNORMAL HIGH (ref 65–99)
POTASSIUM: 4.1 mmol/L (ref 3.5–5.3)
Sodium: 138 mmol/L (ref 135–146)

## 2018-12-10 LAB — TSH: TSH: 2.16 mIU/L (ref 0.40–4.50)

## 2018-12-20 ENCOUNTER — Encounter: Payer: Self-pay | Admitting: Family Medicine

## 2018-12-20 ENCOUNTER — Ambulatory Visit (INDEPENDENT_AMBULATORY_CARE_PROVIDER_SITE_OTHER): Payer: Medicare Other | Admitting: Family Medicine

## 2018-12-20 VITALS — BP 176/70 | HR 90 | Wt 140.0 lb

## 2018-12-20 DIAGNOSIS — I10 Essential (primary) hypertension: Secondary | ICD-10-CM | POA: Diagnosis not present

## 2018-12-20 NOTE — Patient Instructions (Addendum)
Thank you for coming in today. STOP amlodipine  Restart lisinopril 5mg  at bedtkime.   Recheck with me on March as scheduled.

## 2018-12-20 NOTE — Progress Notes (Signed)
       Benjamin Bryan is a 85 y.o. male who presents to [facility] today for hypertension.  Benjamin Bryan was seen by his health insurance nurse who measured his blood pressure and found to be elevated.  Benjamin Bryan had previously over the last few days discontinued his amlodipine.  He has a long history of mild lightheadedness and dizziness and tried stopping the amlodipine.  He denies chest pain or palpitations shortness of breath.  He is here today for evaluation.   ROS as above:  Exam:  BP (!) 176/70   Pulse 90   Wt 140 lb (63.5 kg)   SpO2 100%   BMI 23.30 kg/m  Wt Readings from Last 5 Encounters:  12/20/18 140 lb (63.5 kg)  12/09/18 141 lb (64 kg)  11/28/18 139 lb (63 kg)  11/09/18 140 lb (63.5 kg)  10/19/18 141 lb (64 kg)    Gen: Well NAD HEENT: EOMI,  MMM Lungs: Normal work of breathing. CTABL Heart: RRR no MRG Abd: NABS, Soft. Nondistended, Nontender Exts: Brisk capillary refill, warm and well perfused.   Lab and Radiology Results No results found for this or any previous visit (from the past 72 hour(s)). No results found.    Assessment and Plan: 85 y.o. male with  Hypertension.  Blood pressure not controlled.  Patient has discontinued amlodipine.  Discussed options.  Plan to hold on amlodipine as he was having some lightheadedness and use low-dose lisinopril.  Has had problems with lisinopril and amlodipine in the past however I am optimistic he will tolerate nighttime doses of lisinopril.  Recheck in about 2 weeks.  PDMP not reviewed this encounter. No orders of the defined types were placed in this encounter.  No orders of the defined types were placed in this encounter.    Historical information moved to improve visibility of documentation.  Past Medical History:  Diagnosis Date  . Hyperlipidemia   . Hypertension   . Shingles   . Thyroid disease     Past Surgical History:  Procedure Laterality Date  . radical prostectomy  1994  . removed section of intestine  08/24/2005   polyp   Social History   Tobacco Use  . Smoking status: Former Smoker    Last attempt to quit: 12/11/1984    Years since quitting: 34.0  . Smokeless tobacco: Never Used  Substance Use Topics  . Alcohol use: Not Currently   family history includes Prostate cancer in his father; Stroke in his father.  Medications: Current Outpatient Medications  Medication Sig Dispense Refill  . aspirin 81 MG tablet Take 81 mg by mouth daily.    Marland Kitchen atorvastatin (LIPITOR) 10 MG tablet TAKE 1 TABLET BY MOUTH ONCE DAILY 90 tablet 0  . ELIQUIS 5 MG TABS tablet Take 1 tablet (5 mg total) by mouth 2 (two) times daily. 60 tablet 5  . famotidine (PEPCID) 20 MG tablet Take 20 mg by mouth daily.    . Glucosamine-Chondroitin 1500-1200 MG/30ML LIQD Take by mouth. Take two tablets per day    . levothyroxine (SYNTHROID, LEVOTHROID) 75 MCG tablet Take 1 tablet (75 mcg total) by mouth daily. 90 tablet 2  . lisinopril (PRINIVIL,ZESTRIL) 5 MG tablet Take 5 mg by mouth at bedtime.     No current facility-administered medications for this visit.    No Known Allergies   Discussed warning signs or symptoms. Please see discharge instructions. Patient expresses understanding.

## 2018-12-26 ENCOUNTER — Encounter: Payer: Medicare Other | Admitting: Family Medicine

## 2019-01-04 ENCOUNTER — Telehealth: Payer: Self-pay | Admitting: Family Medicine

## 2019-01-04 NOTE — Telephone Encounter (Signed)
Received documentation from [place] [employer] Medicare wellness visit housecalls report

## 2019-01-09 ENCOUNTER — Ambulatory Visit (INDEPENDENT_AMBULATORY_CARE_PROVIDER_SITE_OTHER): Payer: Medicare Other | Admitting: Family Medicine

## 2019-01-09 ENCOUNTER — Encounter: Payer: Self-pay | Admitting: Family Medicine

## 2019-01-09 ENCOUNTER — Ambulatory Visit (INDEPENDENT_AMBULATORY_CARE_PROVIDER_SITE_OTHER): Payer: Medicare Other

## 2019-01-09 VITALS — BP 164/78 | HR 91 | Wt 144.0 lb

## 2019-01-09 DIAGNOSIS — R05 Cough: Secondary | ICD-10-CM

## 2019-01-09 DIAGNOSIS — I129 Hypertensive chronic kidney disease with stage 1 through stage 4 chronic kidney disease, or unspecified chronic kidney disease: Secondary | ICD-10-CM | POA: Diagnosis not present

## 2019-01-09 DIAGNOSIS — N183 Chronic kidney disease, stage 3 (moderate): Secondary | ICD-10-CM | POA: Diagnosis not present

## 2019-01-09 DIAGNOSIS — J984 Other disorders of lung: Secondary | ICD-10-CM | POA: Diagnosis not present

## 2019-01-09 DIAGNOSIS — R918 Other nonspecific abnormal finding of lung field: Secondary | ICD-10-CM | POA: Diagnosis not present

## 2019-01-09 DIAGNOSIS — R042 Hemoptysis: Secondary | ICD-10-CM

## 2019-01-09 DIAGNOSIS — R059 Cough, unspecified: Secondary | ICD-10-CM

## 2019-01-09 NOTE — Patient Instructions (Addendum)
Thank you for coming in today. Continue current treatment.  We will get xray today.   Follow up as scheduled in March

## 2019-01-09 NOTE — Progress Notes (Signed)
Benjamin Bryan is a 85 y.o. male who presents to [facility]: [facility] today for cough with blood-tinged sputum.  Patient notes a several day history of cough productive of sputum occasionally blood-tinged.  He notes it is improving.  He denies any shortness of breath or trouble breathing.  No treatment tried yet.  No vomiting diarrhea chest pain or palpitations.  Patient had pneumonia in October November 2019 that required hospitalization.  He notes symptoms today are not consistent with that.  Additionally has a history of pulmonary embolism diagnosed at the same time as his pneumonia and is currently on Eliquis.  He denies any other bleeding.  He takes lisinopril as below for hypertension.  He has had significant lightheadedness or dizziness with higher doses or with other blood pressure medications including amlodipine.   ROS as above:  Exam:  BP (!) 164/78   Pulse 91   Wt 144 lb (65.3 kg)   BMI 23.96 kg/m  Wt Readings from Last 5 Encounters:  01/09/19 144 lb (65.3 kg)  12/20/18 140 lb (63.5 kg)  12/09/18 141 lb (64 kg)  11/28/18 139 lb (63 kg)  11/09/18 140 lb (63.5 kg)    Gen: Well NAD HEENT: EOMI,  MMM normal posterior pharynx.  Normal tympanic membranes.  Normal nasal turbinates.  Clear nasal discharge.  Mild cervical lymphadenopathy. Lungs: Normal work of breathing. CTABL Heart: RRR no MRG Abd: NABS, Soft. Nondistended, Nontender Exts: Brisk capillary refill, warm and well perfused.   Lab and Radiology Results Two-view chest x-ray images personally independently reviewed.  No new acute infiltrate.  Resolved right upper lung infiltrate compared to x-ray dated September 28, 2018.  Circular structure present in the right upper lung new from October 2019.  Appears to be not solid.  Await formal radiology review.   Review of Chest CT from [facility] 10/07/18  Acute  Interface, Incoming Rad Results - 10/07/2018 11:36 AM EDT INDICATION:  intermediate VQ.    COMPARISON:  CT chest 2 days prior  TECHNIQUE: Contiguous axial images obtained through the chest after administration of IV contrast. 90 mL  IOPAMIDOL 76 % IV SOLN . 2-D and 3-D MIP images were generated and saved to PACS. Coronal and sagittal reformats.  Radiation dose reduction was  utilized (automated exposure control, mA or kV adjustment based on patient size, or iterative image reconstruction).  FINDINGS:  Pulmonary arteries:  Multiple small pulmonary emboli are noted in the lung bases bilaterally. Small thrombus burden. Several are seen on image 280 of series 6.  Thoracic aorta:         Normal. No aneurysm or dissection.  Mediastinum:  Small pericardial effusion.  Lungs:  Stable infiltrate and cystic change throughout the right upper lobe and posterior right lower lobe. Stable small right pleural effusion. Stable somewhat spiculated nodular density along the pleura of the left apex.  Other:  none      IMPRESSION:    Multiple small bilateral pulmonary emboli. Small thrombus burden.  Small pericardial effusion.  Stable infiltrate and cystic change, mainly involving the right upper lobe.  Stable small right pleural effusion.  Stable nodular pleural density left apex.  Electronically Signed by: Leanord Asal  Assessment and Plan: 85 y.o. male with   Hemoptysis: Patient has improving blood-tinged sputum.  I think this likely represents a little bit of bleeding in his airway due to the blood thinner.  Unlikely to represent anything potentially threatening.  Patient  does have a cystic structure seen on chest x-ray and upper right lung fields.  However per report from [facility] CT scan chest November 2019 this is is not much changed.  I am awaiting however formal radiology review.  He may benefit from repeat chest CT scan  Plan for treatment with watchful waiting.  Hypertension: Blood  pressure bit elevated.  This seems to be his maximum tolerated blood pressure medication dose.  PDMP not reviewed this encounter. Orders Placed This Encounter  Procedures  . DG Chest 2 View    Order Specific Question:   Reason for exam:    Answer:   Cough, assess intra-thoracic pathology    Order Specific Question:   Preferred imaging location?    Answer:   [facility]   No orders of the defined types were placed in this encounter.    Historical information moved to improve visibility of documentation.  Past Medical History:  Diagnosis Date  . Hyperlipidemia   . Hypertension   . Shingles   . Thyroid disease    Past Surgical History:  Procedure Laterality Date  . radical prostectomy  1994  . removed section of intestine  08/24/2005   polyp   Social History   Tobacco Use  . Smoking status: Former Smoker    Last attempt to quit: 12/11/1984    Years since quitting: 34.1  . Smokeless tobacco: Never Used  Substance Use Topics  . Alcohol use: Not Currently   family history includes Prostate cancer in his father; Stroke in his father.  Medications: Current Outpatient Medications  Medication Sig Dispense Refill  . aspirin 81 MG tablet Take 81 mg by mouth daily.    Marland Kitchen atorvastatin (LIPITOR) 10 MG tablet TAKE 1 TABLET BY MOUTH ONCE DAILY 90 tablet 0  . ELIQUIS 5 MG TABS tablet Take 1 tablet (5 mg total) by mouth 2 (two) times daily. 60 tablet 5  . famotidine (PEPCID) 20 MG tablet Take 20 mg by mouth daily.    . Glucosamine-Chondroitin 1500-1200 MG/30ML LIQD Take by mouth. Take two tablets per day    . levothyroxine (SYNTHROID, LEVOTHROID) 75 MCG tablet Take 1 tablet (75 mcg total) by mouth daily. 90 tablet 2  . lisinopril (PRINIVIL,ZESTRIL) 5 MG tablet Take 5 mg by mouth at bedtime.     No current facility-administered medications for this visit.    No Known Allergies   Discussed warning signs or symptoms. Please see discharge instructions. Patient expresses  understanding.

## 2019-02-06 ENCOUNTER — Other Ambulatory Visit: Payer: Self-pay | Admitting: Family Medicine

## 2019-02-08 ENCOUNTER — Encounter: Payer: Self-pay | Admitting: Family Medicine

## 2019-02-08 ENCOUNTER — Ambulatory Visit (INDEPENDENT_AMBULATORY_CARE_PROVIDER_SITE_OTHER): Payer: Medicare Other | Admitting: Family Medicine

## 2019-02-08 VITALS — BP 129/69 | HR 95 | Ht 65.0 in | Wt 144.0 lb

## 2019-02-08 DIAGNOSIS — N183 Chronic kidney disease, stage 3 unspecified: Secondary | ICD-10-CM

## 2019-02-08 DIAGNOSIS — I129 Hypertensive chronic kidney disease with stage 1 through stage 4 chronic kidney disease, or unspecified chronic kidney disease: Secondary | ICD-10-CM

## 2019-02-08 DIAGNOSIS — Z7901 Long term (current) use of anticoagulants: Secondary | ICD-10-CM

## 2019-02-08 NOTE — Progress Notes (Signed)
Benjamin Bryan is a 85 y.o. male who presents to [facility]: [place] today for follow-up hypertension and anticoagulation.  Benjamin Bryan has a history of hypertension.  He has been treated with amlodipine and with lisinopril.  He has trouble tolerating either of these medications even at very low doses.  He notes significant lightheadedness and dizziness with them.  He discontinue the lisinopril on his own in the interim and feels a lot better.  He denies any further lightheadedness or he has been checking his blood pressure at home and notes his blood pressures ranged typically from the 878M to 767M systolic.   Additionally he has been taking Eliquis since early November following diagnosis of pulmonary embolism with pneumonia.  He denies any personal history of DVT or PE prior to this.  He denies any family history of blood clotting disorders.  Last month he had some blood-tinged sputum with a cough but notes now that he is feeling much better.  He feels well with no issues.    ROS as above:  Exam:  BP (!) 161/95   Pulse 99   Ht 5\' 5"  (1.651 m)   Wt 144 lb (65.3 kg)   BMI 23.96 kg/m   Wt Readings from Last 5 Encounters:  02/08/19 144 lb (65.3 kg)  01/09/19 144 lb (65.3 kg)  12/20/18 140 lb (63.5 kg)  12/09/18 141 lb (64 kg)  11/28/18 139 lb (63 kg)    Gen: Well NAD HEENT: EOMI,  MMM Lungs: Normal work of breathing. CTABL Heart: RRR no MRG Abd: NABS, Soft. Nondistended, Nontender Exts: Brisk capillary refill, warm and well perfused.   Lab and Radiology Results No results found for this or any previous visit (from the past 72 hour(s)). No results found.    Assessment and Plan: 85 y.o. male with  Hypertension: Blood pressure elevated today but better controlled at home.  Patient significantly intolerant even of low-dose blood pressure medications.  Plan to abstain from  lisinopril or amlodipine and reassess in 3 months.  Continue home blood pressure log.  Anticoagulation due to pulmonary embolism occurring 4 months ago.  Plan to continue Eliquis until 6 months has elapsed which would be early May.  Reassess in 3 months.  Discontinue Eliquis in 2 months.  PDMP not reviewed this encounter. No orders of the defined types were placed in this encounter.  No orders of the defined types were placed in this encounter.    Historical information moved to improve visibility of documentation.  Past Medical History:  Diagnosis Date  . Hyperlipidemia   . Hypertension   . Shingles   . Thyroid disease    Past Surgical History:  Procedure Laterality Date  . radical prostectomy  1994  . removed section of intestine  08/24/2005   polyp   Social History   Tobacco Use  . Smoking status: Former Smoker    Last attempt to quit: 12/11/1984    Years since quitting: 34.1  . Smokeless tobacco: Never Used  Substance Use Topics  . Alcohol use: Not Currently   family history includes Prostate cancer in his father; Stroke in his father.  Medications: Current Outpatient Medications  Medication Sig Dispense Refill  . aspirin 81 MG tablet Take 81 mg by mouth daily.    Marland Kitchen atorvastatin (LIPITOR) 10 MG tablet TAKE 1 TABLET BY MOUTH ONCE DAILY 90 tablet 0  . ELIQUIS 5 MG TABS tablet Take 1 tablet (5  mg total) by mouth 2 (two) times daily. 60 tablet 5  . famotidine (PEPCID) 20 MG tablet Take 20 mg by mouth daily.    . Glucosamine-Chondroitin 1500-1200 MG/30ML LIQD Take by mouth. Take two tablets per day    . levothyroxine (SYNTHROID, LEVOTHROID) 75 MCG tablet Take 1 tablet by mouth once daily 90 tablet 0   No current facility-administered medications for this visit.    No Known Allergies   Discussed warning signs or symptoms. Please see discharge instructions. Patient expresses understanding.

## 2019-02-08 NOTE — Patient Instructions (Addendum)
Thank you for coming in today. OK to hold off on Lisinopril.  Do not restart. Continue blood pressure checking.  STOP Eliquis in 2 months.   Recheck in 3 months.   Let me know if the runny dose does not improve with the allergy pill.   You can use [place] itch for temporary control of itching at bedtime.

## 2019-02-09 ENCOUNTER — Ambulatory Visit: Payer: Medicare Other | Admitting: Family Medicine

## 2019-02-09 DIAGNOSIS — R7989 Other specified abnormal findings of blood chemistry: Secondary | ICD-10-CM | POA: Diagnosis not present

## 2019-02-09 DIAGNOSIS — Z87891 Personal history of nicotine dependence: Secondary | ICD-10-CM | POA: Diagnosis not present

## 2019-02-09 DIAGNOSIS — I2510 Atherosclerotic heart disease of native coronary artery without angina pectoris: Secondary | ICD-10-CM | POA: Diagnosis not present

## 2019-02-09 DIAGNOSIS — Z79899 Other long term (current) drug therapy: Secondary | ICD-10-CM | POA: Diagnosis not present

## 2019-02-09 DIAGNOSIS — J439 Emphysema, unspecified: Secondary | ICD-10-CM | POA: Diagnosis not present

## 2019-02-09 DIAGNOSIS — N3289 Other specified disorders of bladder: Secondary | ICD-10-CM | POA: Diagnosis not present

## 2019-02-09 DIAGNOSIS — E039 Hypothyroidism, unspecified: Secondary | ICD-10-CM | POA: Diagnosis not present

## 2019-02-09 DIAGNOSIS — K6389 Other specified diseases of intestine: Secondary | ICD-10-CM | POA: Diagnosis not present

## 2019-02-09 DIAGNOSIS — Z7901 Long term (current) use of anticoagulants: Secondary | ICD-10-CM | POA: Diagnosis not present

## 2019-02-09 DIAGNOSIS — R0789 Other chest pain: Secondary | ICD-10-CM | POA: Diagnosis not present

## 2019-02-09 DIAGNOSIS — J929 Pleural plaque without asbestos: Secondary | ICD-10-CM | POA: Diagnosis not present

## 2019-02-09 DIAGNOSIS — E7800 Pure hypercholesterolemia, unspecified: Secondary | ICD-10-CM | POA: Diagnosis not present

## 2019-02-09 DIAGNOSIS — J449 Chronic obstructive pulmonary disease, unspecified: Secondary | ICD-10-CM | POA: Diagnosis not present

## 2019-02-09 DIAGNOSIS — I129 Hypertensive chronic kidney disease with stage 1 through stage 4 chronic kidney disease, or unspecified chronic kidney disease: Secondary | ICD-10-CM | POA: Diagnosis not present

## 2019-02-09 DIAGNOSIS — I10 Essential (primary) hypertension: Secondary | ICD-10-CM | POA: Diagnosis not present

## 2019-02-09 DIAGNOSIS — R319 Hematuria, unspecified: Secondary | ICD-10-CM | POA: Diagnosis not present

## 2019-02-09 DIAGNOSIS — I2694 Multiple subsegmental pulmonary emboli without acute cor pulmonale: Secondary | ICD-10-CM | POA: Diagnosis not present

## 2019-02-09 DIAGNOSIS — N183 Chronic kidney disease, stage 3 (moderate): Secondary | ICD-10-CM | POA: Diagnosis not present

## 2019-02-09 DIAGNOSIS — K5730 Diverticulosis of large intestine without perforation or abscess without bleeding: Secondary | ICD-10-CM | POA: Diagnosis not present

## 2019-02-09 DIAGNOSIS — R079 Chest pain, unspecified: Secondary | ICD-10-CM | POA: Diagnosis not present

## 2019-02-09 DIAGNOSIS — K7689 Other specified diseases of liver: Secondary | ICD-10-CM | POA: Diagnosis not present

## 2019-02-09 DIAGNOSIS — N200 Calculus of kidney: Secondary | ICD-10-CM | POA: Diagnosis not present

## 2019-02-09 DIAGNOSIS — N179 Acute kidney failure, unspecified: Secondary | ICD-10-CM | POA: Diagnosis not present

## 2019-02-10 DIAGNOSIS — R079 Chest pain, unspecified: Secondary | ICD-10-CM | POA: Diagnosis not present

## 2019-02-10 DIAGNOSIS — N179 Acute kidney failure, unspecified: Secondary | ICD-10-CM | POA: Diagnosis not present

## 2019-02-10 DIAGNOSIS — J449 Chronic obstructive pulmonary disease, unspecified: Secondary | ICD-10-CM | POA: Diagnosis not present

## 2019-02-10 DIAGNOSIS — N183 Chronic kidney disease, stage 3 (moderate): Secondary | ICD-10-CM | POA: Diagnosis not present

## 2019-02-10 DIAGNOSIS — I10 Essential (primary) hypertension: Secondary | ICD-10-CM | POA: Diagnosis not present

## 2019-02-10 LAB — LIPID PANEL
Cholesterol: 105 (ref 0–200)
HDL: 44 (ref 35–70)
LDL Cholesterol: 52
Triglycerides: 46 (ref 40–160)

## 2019-02-10 LAB — BASIC METABOLIC PANEL
BUN: 20 (ref 4–21)
Creatinine: 1.5 — AB (ref 0.6–1.3)
Glucose: 123
Potassium: 4.5 (ref 3.4–5.3)
Sodium: 138 (ref 137–147)

## 2019-02-10 LAB — HEPATIC FUNCTION PANEL
ALT: 9 — AB (ref 10–40)
AST: 14 (ref 14–40)
Alkaline Phosphatase: 68 (ref 25–125)
Bilirubin, Total: 0.5

## 2019-02-10 LAB — CBC AND DIFFERENTIAL: Hemoglobin: 10.3 — AB (ref 13.5–17.5)

## 2019-02-11 MED ORDER — GENERIC EXTERNAL MEDICATION
650.00 | Status: DC
Start: ? — End: 2019-02-11

## 2019-02-11 MED ORDER — ASPIRIN EC 81 MG PO TBEC
81.00 | DELAYED_RELEASE_TABLET | ORAL | Status: DC
Start: 2019-02-11 — End: 2019-02-11

## 2019-02-11 MED ORDER — MORPHINE SULFATE (PF) 4 MG/ML IV SOLN
2.00 | INTRAVENOUS | Status: DC
Start: ? — End: 2019-02-11

## 2019-02-11 MED ORDER — ALPRAZOLAM 0.25 MG PO TABS
.25 | ORAL_TABLET | ORAL | Status: DC
Start: ? — End: 2019-02-11

## 2019-02-11 MED ORDER — METOCLOPRAMIDE HCL 5 MG/ML IJ SOLN
10.00 | INTRAMUSCULAR | Status: DC
Start: ? — End: 2019-02-11

## 2019-02-11 MED ORDER — PRAVASTATIN SODIUM 40 MG PO TABS
40.00 | ORAL_TABLET | ORAL | Status: DC
Start: 2019-02-10 — End: 2019-02-11

## 2019-02-11 MED ORDER — SENNA-DOCUSATE SODIUM 8.6-50 MG PO TABS
1.00 | ORAL_TABLET | ORAL | Status: DC
Start: ? — End: 2019-02-11

## 2019-02-11 MED ORDER — APIXABAN 5 MG PO TABS
5.00 | ORAL_TABLET | ORAL | Status: DC
Start: 2019-02-10 — End: 2019-02-11

## 2019-02-11 MED ORDER — LEVOTHYROXINE SODIUM 75 MCG PO TABS
75.00 | ORAL_TABLET | ORAL | Status: DC
Start: 2019-02-11 — End: 2019-02-11

## 2019-02-11 MED ORDER — GENERIC EXTERNAL MEDICATION
1.00 | Status: DC
Start: ? — End: 2019-02-11

## 2019-02-11 MED ORDER — DIPHENHYDRAMINE HCL 25 MG PO CAPS
25.00 | ORAL_CAPSULE | ORAL | Status: DC
Start: ? — End: 2019-02-11

## 2019-02-11 MED ORDER — SODIUM CHLORIDE 0.9 % IV SOLN
10.00 | INTRAVENOUS | Status: DC
Start: ? — End: 2019-02-11

## 2019-02-11 MED ORDER — NITROGLYCERIN 0.4 MG SL SUBL
.40 | SUBLINGUAL_TABLET | SUBLINGUAL | Status: DC
Start: ? — End: 2019-02-11

## 2019-02-11 MED ORDER — GENERIC EXTERNAL MEDICATION
10.00 | Status: DC
Start: ? — End: 2019-02-11

## 2019-02-15 ENCOUNTER — Other Ambulatory Visit: Payer: Self-pay | Admitting: Family Medicine

## 2019-02-28 ENCOUNTER — Other Ambulatory Visit: Payer: Self-pay

## 2019-02-28 ENCOUNTER — Ambulatory Visit (INDEPENDENT_AMBULATORY_CARE_PROVIDER_SITE_OTHER): Payer: Medicare Other | Admitting: Family Medicine

## 2019-02-28 VITALS — BP 146/61 | HR 100 | Wt 140.0 lb

## 2019-02-28 DIAGNOSIS — I129 Hypertensive chronic kidney disease with stage 1 through stage 4 chronic kidney disease, or unspecified chronic kidney disease: Secondary | ICD-10-CM | POA: Diagnosis not present

## 2019-02-28 DIAGNOSIS — N183 Chronic kidney disease, stage 3 (moderate): Secondary | ICD-10-CM

## 2019-02-28 DIAGNOSIS — Z7901 Long term (current) use of anticoagulants: Secondary | ICD-10-CM

## 2019-02-28 MED ORDER — ELIQUIS 5 MG PO TABS: 5.0000 mg | ORAL_TABLET | Freq: Two times a day (BID) | ORAL | 1 refills | Status: DC

## 2019-02-28 NOTE — Patient Instructions (Signed)
Thank you for coming in today. Continue elquis for at least 3 months.  Monitor blood pressure and consider starting blood pressure medicine.   Keep me updated.   Check back via phone call in 1 month.

## 2019-02-28 NOTE — Progress Notes (Addendum)
Virtual Visit  via Video or Phone Note  I connected with@ on 02/28/19 at 1:00 by a telemedicine application and verified that I am speaking with the correct person using two identifiers.   I discussed the limitations of evaluation and management by telemedicine and the availability of in person appointments. The patient expressed understanding and agreed to proceed.  History of Present Illness: Benjamin Bryan is a 85 y.o. male who would like to discuss chest pain hospital follow up.   Benjamin Bryan was admitted to the [facility] on March 5 and discharged the next day for chest pain evaluation.  His troponins were negative and his blood pressure was quite elevated.  He had stress testing that was also negative.  Since then he feels fine.  He notes his blood pressures are typically in the 130s to 140s over 60s.  He has had trouble tolerating lisinopril and amlodipine even at low doses due to lightheadedness.  He is very reluctant to restart blood pressure medications.  Additionally he continues Eliquis for pulmonary embolism.  He is had one pulmonary embolism and has been on it for about 6 months.  He is not sure if he should continue taking it or not.  He tolerates it well with no bleeding.  Additionally during his hospitalization his creatinine was 1.5.  His creatinine in January was 1.5.  He feels fine with no leg swelling.   Observations/Objective: BP (!) 146/61   Pulse 100   Wt 140 lb (63.5 kg)   BMI 23.30 kg/m  Wt Readings from Last 5 Encounters:  02/28/19 140 lb (63.5 kg)  02/08/19 144 lb (65.3 kg)  01/09/19 144 lb (65.3 kg)  12/20/18 140 lb (63.5 kg)  12/09/18 141 lb (64 kg)   Exam: Normal Speech.  No shortness of breath.  Normal speech thought process and affect.  Lab and Radiology Results   Chemistry      Component Value Date/Time   NA 138 02/10/2019   K 4.5 02/10/2019   CL 104 12/09/2018 1119   CO2 25 12/09/2018 1119   BUN 20 02/10/2019   CREATININE 1.5 (A)  02/10/2019   CREATININE 1.51 (H) 12/09/2018 1119   GLU 123 02/10/2019      Component Value Date/Time   CALCIUM 9.3 12/09/2018 1119   CALCIUM 9.0 03/18/2015   ALKPHOS 68 02/10/2019   AST 14 02/10/2019   ALT 9 (A) 02/10/2019   BILITOT 0.5 11/09/2018 1526        Assessment and Plan: 85 y.o. male with  Chest pain: Thought to be due to hypertension.  Currently asymptomatic with negative troponins and stress test.  Watchful waiting.  Hypertension: Not well controlled however patient had difficulty tolerating even low-dose antihypertensives.  Discussed options.  Patient declined further medication.  Neck step would likely be low-dose metoprolol.  Recheck in about a month continue home blood pressure log.  CKD: Creatinine stable.  Creatinine was 1.5 in January and again in March.  Watchful waiting consider recheck in a few months.   Anticoagulation: I think is probably less risky to stay on Eliquis right now as his risk of having a hospital visit is higher if off of Eliquis.  Would like to keep out of the hospital due to COVID-19 crisis.   Orders Placed This Encounter  Procedures  . CBC and differential    This external order was created through the Results Console.  . Basic metabolic panel    This external order was created  through the Results Console.  . Lipid panel    This external order was created through the Results Console.  . Hepatic function panel    This external order was created through the Results Console.   Meds ordered this encounter  Medications  . ELIQUIS 5 MG TABS tablet    Sig: Take 1 tablet (5 mg total) by mouth 2 (two) times daily.    Dispense:  180 tablet    Refill:  1    Follow Up Instructions:    I discussed the assessment and treatment plan with the patient. The patient was provided an opportunity to ask questions and all were answered. The patient agreed with the plan and demonstrated an understanding of the instructions.   The patient was advised  to call back or seek an in-person evaluation if the symptoms worsen or if the condition fails to improve as anticipated.  I provided 21 minutes of non-face-to-face time during this encounter.    Historical information moved to improve visibility of documentation.  Past Medical History:  Diagnosis Date  . Hyperlipidemia   . Hypertension   . Shingles   . Thyroid disease    Past Surgical History:  Procedure Laterality Date  . radical prostectomy  1994  . removed section of intestine  08/24/2005   polyp   Social History   Tobacco Use  . Smoking status: Former Smoker    Last attempt to quit: 12/11/1984    Years since quitting: 34.2  . Smokeless tobacco: Never Used  Substance Use Topics  . Alcohol use: Not Currently   family history includes Prostate cancer in his father; Stroke in his father.  Medications: Current Outpatient Medications  Medication Sig Dispense Refill  . aspirin 81 MG tablet Take 81 mg by mouth daily.    Marland Kitchen atorvastatin (LIPITOR) 10 MG tablet TAKE 1 TABLET BY MOUTH ONCE DAILY 90 tablet 0  . ELIQUIS 5 MG TABS tablet Take 1 tablet (5 mg total) by mouth 2 (two) times daily. 180 tablet 1  . famotidine (PEPCID) 20 MG tablet Take 20 mg by mouth daily.    . fexofenadine (ALLEGRA) 180 MG tablet Take 180 mg by mouth daily.    . Glucosamine-Chondroitin 1500-1200 MG/30ML LIQD Take by mouth. Take two tablets per day    . levothyroxine (SYNTHROID, LEVOTHROID) 75 MCG tablet Take 1 tablet by mouth once daily 90 tablet 0   No current facility-administered medications for this visit.    No Known Allergies  PDMP not reviewed this encounter. Orders Placed This Encounter  Procedures  . CBC and differential    This external order was created through the Results Console.  . Basic metabolic panel    This external order was created through the Results Console.  . Lipid panel    This external order was created through the Results Console.  . Hepatic function panel    This  external order was created through the Results Console.   Meds ordered this encounter  Medications  . ELIQUIS 5 MG TABS tablet    Sig: Take 1 tablet (5 mg total) by mouth 2 (two) times daily.    Dispense:  180 tablet    Refill:  1  Addendum to correct incorrect date due to templating error

## 2019-03-08 ENCOUNTER — Telehealth: Payer: Self-pay | Admitting: Family Medicine

## 2019-03-08 NOTE — Telephone Encounter (Signed)
Stop lisinopril.

## 2019-03-08 NOTE — Telephone Encounter (Signed)
Pt called and stated he could not take the lisinopril . Stated it makes him so dizzy, he cannot walk or drive. Pt states the amlodipine did not make him feel bad like this. Pt wants to discuss this with Dr. Georgina Snell.

## 2019-03-08 NOTE — Telephone Encounter (Signed)
Pt aware of providers advice.

## 2019-03-08 NOTE — Telephone Encounter (Signed)
Pt's BP today was 105/51. Pulse 105

## 2019-03-24 ENCOUNTER — Telehealth: Payer: Self-pay | Admitting: Family Medicine

## 2019-03-24 ENCOUNTER — Encounter: Payer: Self-pay | Admitting: Family Medicine

## 2019-03-24 NOTE — Progress Notes (Signed)
Patient provided home vital signs including blood pressure and heart rate over the last month.  I have abstracted the last several days of these vital signs and for the most part his blood pressure is 130s over 50s or 60s with a heart rate between 80 and 90.  Patient has had significant difficulty tolerating antihypertensive medications and is currently not taking any medications for blood pressure control.  Based on his most recent vital signs I think it is reasonable to not have any blood pressure control.  Continue current regimen.

## 2019-03-24 NOTE — Telephone Encounter (Signed)
I received your blood pressure results from the last month.  I think with a blood pressure usually in the 130s/60s range you do not need any changes to your medications.  You had problems tolerating other blood pressure medicines and we will just continue to follow along.

## 2019-03-24 NOTE — Telephone Encounter (Signed)
Pt's wife advised of recommendation (Pt was taking a nap). Verbalized understanding. They will continue current plan and continue to monitor at home and let PCP know of readings. No further questions.

## 2019-04-06 ENCOUNTER — Other Ambulatory Visit: Payer: Self-pay

## 2019-04-06 NOTE — Patient Outreach (Signed)
[facility] Care Management  04/06/2019  Benjamin Bryan 11/26/1928 [id number]   Medication Adherence call to [name] [facility] Identifiers Verify spoke with patient he is due on Lisinopril 5 mg patient explain he is no longer taking this medication doctor took him off and now he is taking Eliquis 5 mg. Mr. Ring is showing past due under [place].   [facility] Direct Dial [phone]  Fax [phone] [email or web address]

## 2019-04-07 ENCOUNTER — Encounter: Payer: Self-pay | Admitting: Family Medicine

## 2019-04-07 ENCOUNTER — Ambulatory Visit (INDEPENDENT_AMBULATORY_CARE_PROVIDER_SITE_OTHER): Payer: Medicare Other | Admitting: Family Medicine

## 2019-04-07 ENCOUNTER — Telehealth: Payer: Self-pay | Admitting: Family Medicine

## 2019-04-07 VITALS — BP 135/52 | HR 93 | Ht 65.0 in | Wt 140.0 lb

## 2019-04-07 DIAGNOSIS — E039 Hypothyroidism, unspecified: Secondary | ICD-10-CM | POA: Diagnosis not present

## 2019-04-07 DIAGNOSIS — I708 Atherosclerosis of other arteries: Secondary | ICD-10-CM

## 2019-04-07 DIAGNOSIS — I700 Atherosclerosis of aorta: Secondary | ICD-10-CM | POA: Diagnosis not present

## 2019-04-07 DIAGNOSIS — E782 Mixed hyperlipidemia: Secondary | ICD-10-CM

## 2019-04-07 DIAGNOSIS — J449 Chronic obstructive pulmonary disease, unspecified: Secondary | ICD-10-CM

## 2019-04-07 DIAGNOSIS — I129 Hypertensive chronic kidney disease with stage 1 through stage 4 chronic kidney disease, or unspecified chronic kidney disease: Secondary | ICD-10-CM | POA: Diagnosis not present

## 2019-04-07 DIAGNOSIS — G903 Multi-system degeneration of the autonomic nervous system: Secondary | ICD-10-CM

## 2019-04-07 DIAGNOSIS — N183 Chronic kidney disease, stage 3 (moderate): Secondary | ICD-10-CM

## 2019-04-07 DIAGNOSIS — Z86711 Personal history of pulmonary embolism: Secondary | ICD-10-CM

## 2019-04-07 MED ORDER — IPRATROPIUM BROMIDE 0.06 % NA SOLN: 2.0000 | NASAL | 6 refills | Status: DC | PRN

## 2019-04-07 NOTE — Telephone Encounter (Signed)
Left message with information below and for patient to call us back to schedule an appointment. °

## 2019-04-07 NOTE — Patient Instructions (Addendum)
Thank you for coming in today.  Lets schedule a video or phone visit in 3 months.  My office staff will call you to set up the appointment.  Continue eliquis.  Let me know if you have any bleeding.  Keep yourself safe.   Use the nasal spray with runny nose or in the morning or before eating to prevent vasomotor rhinitis.    Nonallergic Rhinitis Nonallergic rhinitis is a condition that causes symptoms that affect the nose, such as a runny nose and a stuffed-up nose (nasal congestion) that can make it hard to breathe through the nose. This condition is different from having an allergy (allergic rhinitis). Allergic rhinitis occurs when the body's defense system (immune system) reacts to a substance that you are allergic to (allergen), such as pollen, pet dander, mold, or dust. Nonallergic rhinitis has many similar symptoms, but it is not caused by allergens. Nonallergic rhinitis can be a short-term or long-term problem. What are the causes? This condition can be caused by many different things. Some common types of nonallergic rhinitis include: Infectious rhinitis  This is usually due to an infection in the upper respiratory tract. Vasomotor rhinitis  This is the most common type of long-term nonallergic rhinitis.  It is caused by too much blood flow through the nose, which makes the tissue inside of the nose swell.  Symptoms are often triggered by strong odors, cold air, stress, drinking alcohol, cigarette smoke, or changes in the weather. Occupational rhinitis  This type is caused by triggers in the workplace, such as chemicals, dusts, animal dander, or air pollution. Hormonal rhinitis  This type occurs in women as a result of an increase in the male hormone estrogen.  It may occur during pregnancy, puberty, and menstrual cycles.  Symptoms improve when estrogen levels drop. Drug-induced rhinitis Several drugs can cause nonallergic rhinitis, including:  Medicines that are used  to treat high blood pressure, heart disease, and Parkinson disease.  Aspirin and NSAIDs.  Over-the-counter nasal decongestant sprays. These can cause a type of nonallergic rhinitis (rhinitis medicamentosa) when they are used for more than a few days. Nonallergic rhinitis with eosinophilia syndrome (NARES)  This type is caused by having too much of a certain type of white blood cell (eosinophil). Nonallergic rhinitis can also be caused by a reaction to eating hot or spicy foods. This does not usually cause long-term symptoms. In some cases, the cause of nonallergic rhinitis is not known. What increases the risk? You are more likely to develop this condition if:  You are 37-29 years of age.  You are a woman. Women are twice as likely to have this condition. What are the signs or symptoms? Common symptoms of this condition include:  Nasal congestion.  Runny nose.  The feeling of mucus going down the back of the throat (postnasal drip).  Trouble sleeping at night and daytime sleepiness. Less common symptoms include:  Sneezing.  Coughing.  Itchy nose.  Bloodshot eyes. How is this diagnosed? This condition may be diagnosed based on:  Your symptoms and medical history.  A physical exam.  Allergy testing to rule out allergic rhinitis. You may have skin tests or blood tests. In some cases, the health care provider may take a swab of nasal secretions to look for an increased number of eosinophils. This would be done to confirm a diagnosis of NARES. How is this treated? Treatment for this condition depends on the cause. No single treatment works for everyone. Work with your health care  provider to find the best treatment for you. Treatment may include:  Avoiding the things that trigger your symptoms.  Using medicines to relieve congestion, such as: ? Steroid nasal spray. There are many types. You may need to try a few to find out which one works best. ? Decongestant medicine.  This may be an oral medicine or a nasal spray. These medicines are only used for a short time.  Using medicines to relieve a runny nose. These may include antihistamine medicines or anticholinergic nasal sprays.  Surgery to remove tissue from inside the nose may be needed in severe cases if the condition has not improved after 6-12 months of medical treatment. Follow these instructions at home:  Take or use over-the-counter and prescription medicines only as told by your health care provider. Do not stop using your medicine even if you start to feel better.  Use salt-water (saline) rinses or other solutions (nasal washes or irrigations) to wash or rinse out the inside of your nose as told by your health care provider.  Do not take NSAIDs or medicines that contain aspirin if they make your symptoms worse.  Do not drink alcohol if it makes your symptoms worse.  Do not use any tobacco products, such as cigarettes, chewing tobacco, and e-cigarettes. If you need help quitting, ask your health care provider.  Avoid secondhand smoke.  Get some exercise every day. Exercise may help reduce symptoms of nonallergic rhinitis for some people. Ask your health care provider how much exercise and what types of exercise are safe for you.  Sleep with the head of your bed raised (elevated). This may reduce nighttime nasal congestion.  Keep all follow-up visits as told by your health care provider. This is important. Contact a health care provider if:  You have a fever.  Your symptoms are getting worse at home.  Your symptoms are not responding to medicine.  You develop new symptoms, especially a headache or nosebleed. This information is not intended to replace advice given to you by your health care provider. Make sure you discuss any questions you have with your health care provider. Document Released: 03/16/2016 Document Revised: 04/30/2016 Document Reviewed: 02/13/2016 Elsevier Interactive  Patient Education  2019 [employer].

## 2019-04-07 NOTE — Telephone Encounter (Signed)
-----   Message from Gregor Hams, MD sent at 04/07/2019  9:16 AM EDT ----- Regarding: 3 month recheck Please call Jeneen Rinks and schedule a 3 month follow up visit for HTN and blood thinning and rhiniits

## 2019-04-07 NOTE — Progress Notes (Signed)
Virtual Visit  I connected with      Benjamin Bryan  by a telemedicine application and verified that I am speaking with the correct person using two identifiers.   I discussed the limitations of evaluation and management by telemedicine and the availability of in person appointments. The patient expressed understanding and agreed to proceed.  History of Present Illness: Benjamin Bryan is a 85 y.o. male who would like to discuss hypertension, Eliquis, and rhinitis.   Hypertension: [name] has hypertension with kidney disease.  He has had difficulty tolerating even very low dosages of several different blood pressure medications including beta-blockers and ACE inhibitors.  His main complaint is that he gets lightheaded with these.  He has been off of blood pressure medications for a few months now.  He kept a 1 month blood pressure log as below. Home blood pressure log shows blood pressures typically ranging from 114/51-142/67.  Heart rate typically is in the 80s.  He feels well and is happy with how things are going.  Additionally [name] had a pulmonary embolism while very sick in the hospital with pneumonia.  This was picked up on CT angiogram and his clot burden was quite low.  He has been on Eliquis now for about 6 months.  He tolerates it well without a lot of problems.  He denies significant bleeding or bruising.  He wants to know how long he should take it for.  Maveryk also notes a bothersome runny nose.  This tends to occur throughout the year and is worse in the morning and worse with eating.  He is not had much treatment trials with this yet.  He notes it is annoying but not life-threatening or causing severe symptoms.  Additionally he takes levothyroxine for hypothyroidism.  He tolerates it well with no issues.  Observations/Objective: BP (!) 135/52   Pulse 93   Ht 5\' 5"  (1.651 m)   Wt 140 lb (63.5 kg)   BMI 23.30 kg/m  Wt Readings from Last 5 Encounters:  04/07/19 140 lb (63.5  kg)  02/28/19 140 lb (63.5 kg)  02/08/19 144 lb (65.3 kg)  01/09/19 144 lb (65.3 kg)  12/20/18 140 lb (63.5 kg)   Exam: Normal Speech.  No shortness of breath hoarse voice quality.  No distress.      Lab and Radiology Results   Chemistry      Component Value Date/Time   NA 138 02/10/2019   K 4.5 02/10/2019   CL 104 12/09/2018 1119   CO2 25 12/09/2018 1119   BUN 20 02/10/2019   CREATININE 1.5 (A) 02/10/2019   CREATININE 1.51 (H) 12/09/2018 1119   GLU 123 02/10/2019      Component Value Date/Time   CALCIUM 9.3 12/09/2018 1119   CALCIUM 9.0 03/18/2015   ALKPHOS 68 02/10/2019   AST 14 02/10/2019   ALT 9 (A) 02/10/2019   BILITOT 0.5 11/09/2018 1526     Lab Results  Component Value Date   WBC 7.8 12/09/2018   HGB 10.3 (A) 02/10/2019   HCT 35.0 (L) 12/09/2018   MCV 90.0 12/09/2018   PLT 420 (H) 12/09/2018     Assessment and Plan: 85 y.o. male with  Hypertension CKD: Blood pressure log attached above largely in reasonably normal range.  In general patients with chronic kidney disease and hypertension probably would do better with ACE inhibitor however [name] has had significant difficulty tolerating even low-dose of blood pressure medications.  Based on his prior  history and relatively normal blood pressures as well as preserved creatinine I think it is reasonable to continue watchful waiting off of antihypertension medications.  Plan to continue current regimen and recheck in about 3 months.  Would likely recheck labs at that point.  Anticoagulation due to pulmonary embolism.  Patient is now 6 months after his first pulmonary embolism/VTE.  Theoretically he could discontinue Eliquis at this point.  As he is tolerating it well I think it is reasonable to reassess in 3 months.  Will check CBC and metabolic panel then and if showing signs of anemia would discontinue.  Likely will continue for 9 to 12 months unless he is having problems.  Rhinitis: Patient describes symptoms  that are more consistent with vasomotor rhinitis.  We had a discussion about the cause of this and will try treatment with Atrovent nasal spray.  Handout provided and will be mailed to patient.  Hypothyroidism: Tolerating current dose of levothyroxine with normal TSH.  Plan to recheck later this year with next labs.  Additionally COPD seems to be doing well.  Watchful waiting.  Meds ordered this encounter  Medications  . ipratropium (ATROVENT) 0.06 % nasal spray    Sig: Place 2 sprays into both nostrils every 4 (four) hours as needed.    Dispense:  10 mL    Refill:  6    Follow Up Instructions:    I discussed the assessment and treatment plan with the patient. The patient was provided an opportunity to ask questions and all were answered. The patient agreed with the plan and demonstrated an understanding of the instructions.   The patient was advised to call back or seek an in-person evaluation if the symptoms worsen or if the condition fails to improve as anticipated.  Time: 25 minutes of intraservice time, with >39 minutes of total time during today's visit.      Historical information moved to improve visibility of documentation.  Past Medical History:  Diagnosis Date  . Hyperlipidemia   . Hypertension   . Shingles   . Thyroid disease    Past Surgical History:  Procedure Laterality Date  . radical prostectomy  1994  . removed section of intestine  08/24/2005   polyp   Social History   Tobacco Use  . Smoking status: Former Smoker    Last attempt to quit: 12/11/1984    Years since quitting: 34.3  . Smokeless tobacco: Never Used  Substance Use Topics  . Alcohol use: Not Currently   family history includes Prostate cancer in his father; Stroke in his father.  Medications: Current Outpatient Medications  Medication Sig Dispense Refill  . aspirin 81 MG tablet Take 81 mg by mouth daily.    Marland Kitchen atorvastatin (LIPITOR) 10 MG tablet TAKE 1 TABLET BY MOUTH ONCE DAILY 90  tablet 0  . ELIQUIS 5 MG TABS tablet Take 1 tablet (5 mg total) by mouth 2 (two) times daily. 180 tablet 1  . famotidine (PEPCID) 20 MG tablet Take 20 mg by mouth daily.    . fexofenadine (ALLEGRA) 180 MG tablet Take 180 mg by mouth daily.    . Glucosamine-Chondroitin 1500-1200 MG/30ML LIQD Take by mouth. Take two tablets per day    . levothyroxine (SYNTHROID, LEVOTHROID) 75 MCG tablet Take 1 tablet by mouth once daily 90 tablet 0  . ipratropium (ATROVENT) 0.06 % nasal spray Place 2 sprays into both nostrils every 4 (four) hours as needed. 10 mL 6   No current facility-administered medications for  this visit.    No Known Allergies

## 2019-05-02 IMAGING — DX DG CHEST 2V
2 series · 2 of 2 positions shown · non-contrast
Comparison: Chest CT 08/13/2009

CLINICAL DATA: 89-year-old with cough.  Former smoker.

EXAM:
CHEST - 2 VIEW

[chest pa]
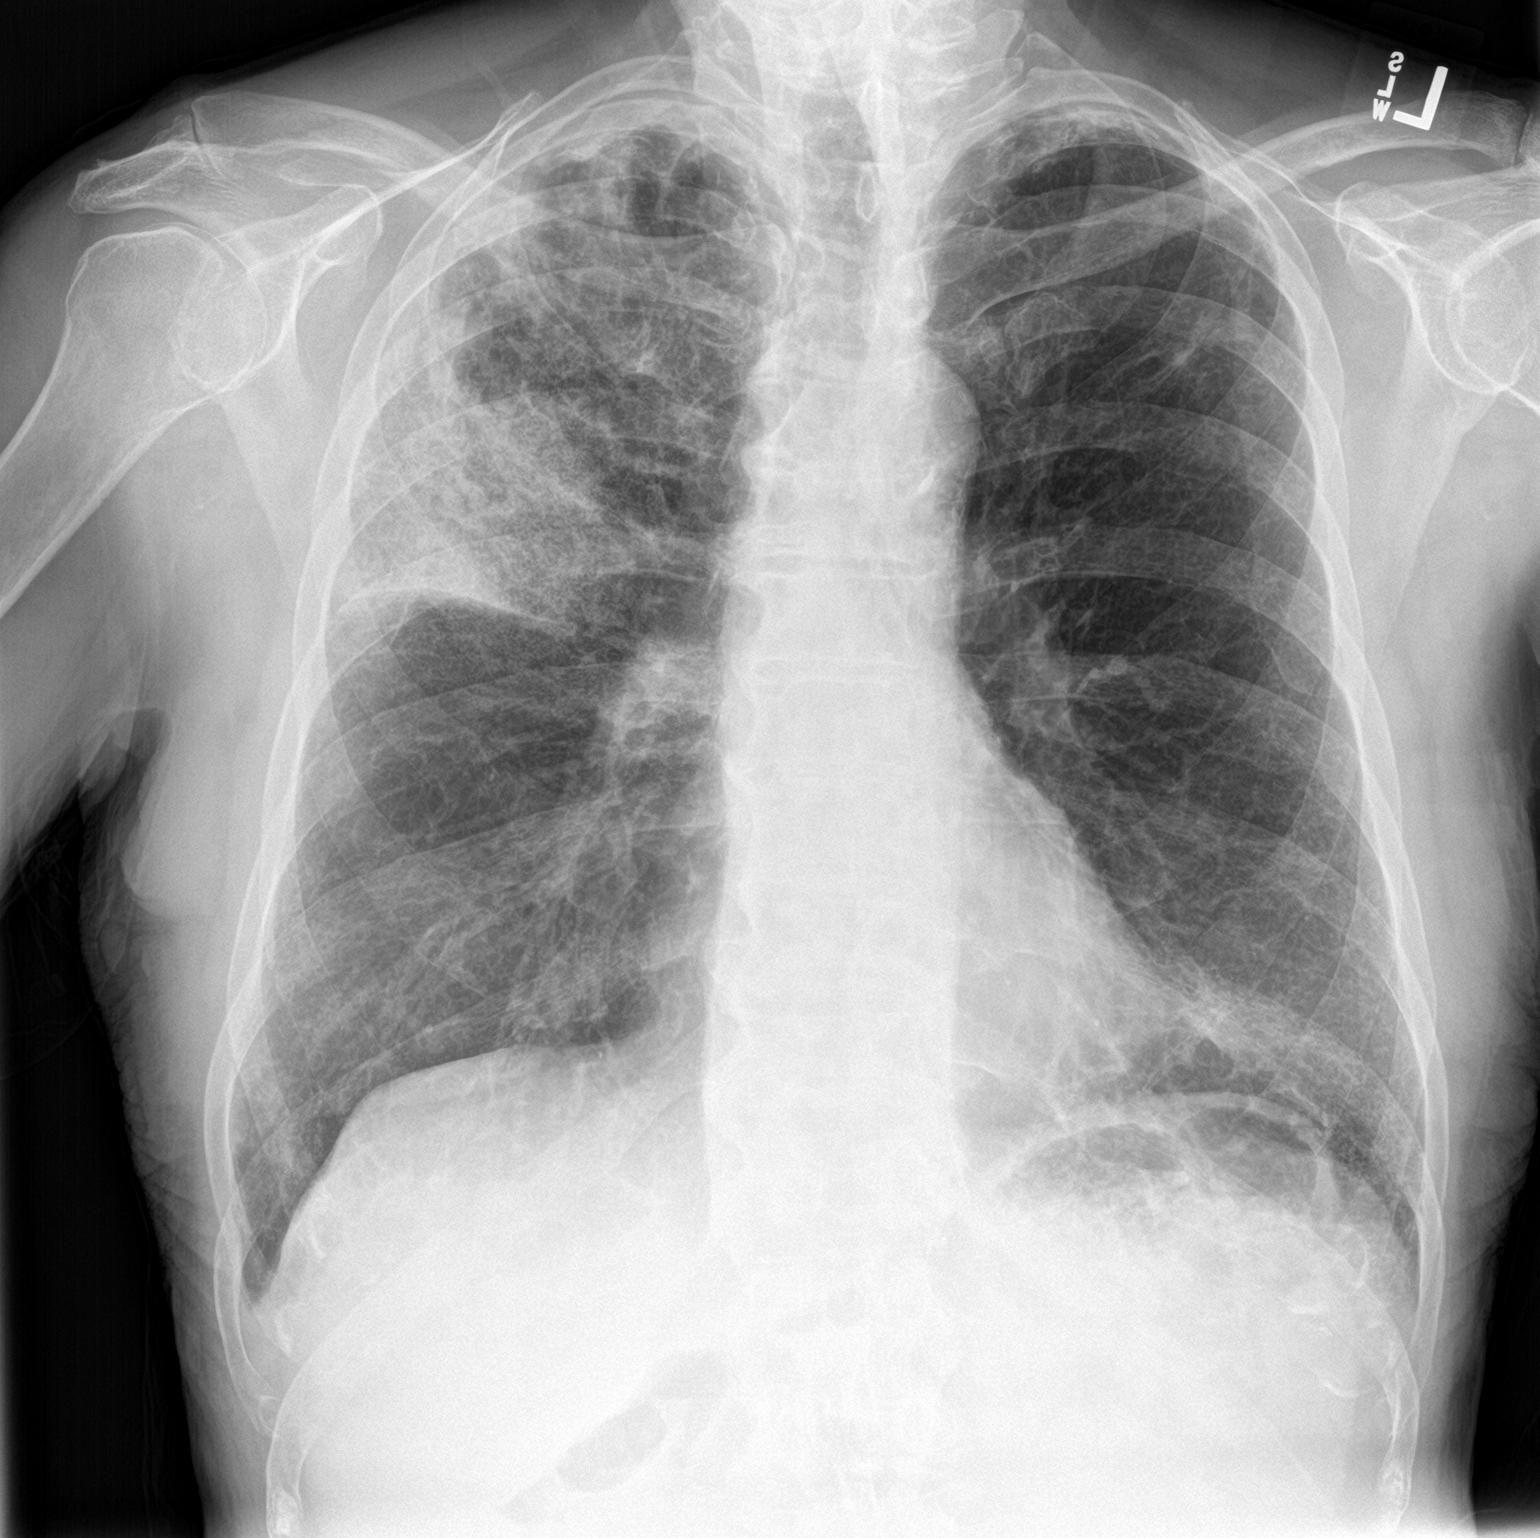

[chest lat]
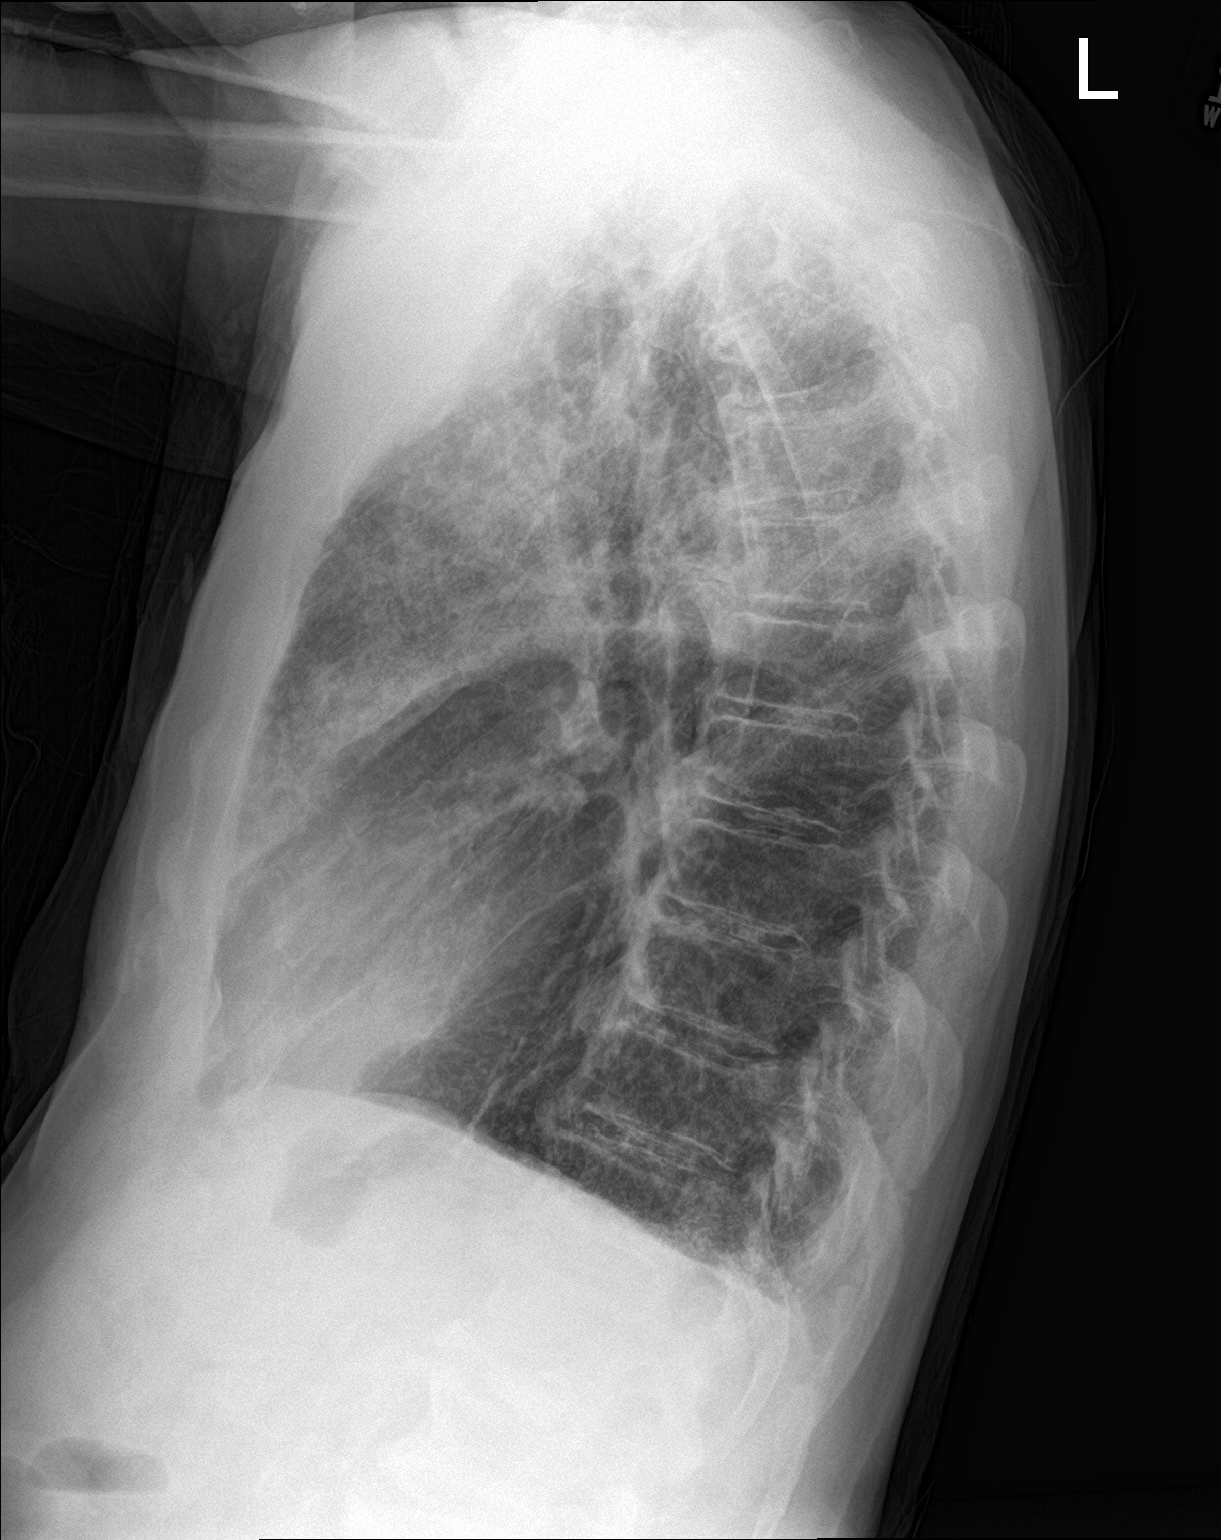

[2 of 2 positions shown; findings below may reference images not displayed]

FINDINGS: Airspace densities in the periphery of the right upper lobe. A
nodular density in the left upper lobe appears chronic based on
previous chest CT. Slightly prominent interstitial markings in both
lower lungs could represent chronic changes versus subtle infection.
In particular, there is concern for subtle airspace disease in the
lingula. Heart and mediastinum are within normal limits. Trachea is
midline. No large pleural effusions. Scarring at both lung apices.
Atherosclerotic calcifications at the aortic arch.
IMPRESSION: Right upper lung pneumonia. Question chronic changes versus subtle
pneumonia near the lung bases, particularly in the lingula. Followup
PA and lateral chest X-ray is recommended in 3-4 weeks following
trial of antibiotic therapy to ensure resolution and exclude
underlying malignancy.

## 2019-05-05 ENCOUNTER — Other Ambulatory Visit: Payer: Self-pay | Admitting: Family Medicine

## 2019-05-11 ENCOUNTER — Ambulatory Visit (INDEPENDENT_AMBULATORY_CARE_PROVIDER_SITE_OTHER): Payer: Medicare Other | Admitting: Family Medicine

## 2019-05-11 ENCOUNTER — Encounter: Payer: Self-pay | Admitting: Family Medicine

## 2019-05-11 VITALS — BP 155/83 | HR 88 | Temp 98.1°F | Wt 141.0 lb

## 2019-05-11 DIAGNOSIS — N183 Chronic kidney disease, stage 3 (moderate): Secondary | ICD-10-CM | POA: Diagnosis not present

## 2019-05-11 DIAGNOSIS — I129 Hypertensive chronic kidney disease with stage 1 through stage 4 chronic kidney disease, or unspecified chronic kidney disease: Secondary | ICD-10-CM | POA: Diagnosis not present

## 2019-05-11 DIAGNOSIS — Z86711 Personal history of pulmonary embolism: Secondary | ICD-10-CM

## 2019-05-11 DIAGNOSIS — I708 Atherosclerosis of other arteries: Secondary | ICD-10-CM

## 2019-05-11 DIAGNOSIS — E782 Mixed hyperlipidemia: Secondary | ICD-10-CM

## 2019-05-11 DIAGNOSIS — J449 Chronic obstructive pulmonary disease, unspecified: Secondary | ICD-10-CM

## 2019-05-11 DIAGNOSIS — I700 Atherosclerosis of aorta: Secondary | ICD-10-CM

## 2019-05-11 DIAGNOSIS — G903 Multi-system degeneration of the autonomic nervous system: Secondary | ICD-10-CM | POA: Diagnosis not present

## 2019-05-11 DIAGNOSIS — E039 Hypothyroidism, unspecified: Secondary | ICD-10-CM | POA: Diagnosis not present

## 2019-05-11 MED ORDER — LEVOTHYROXINE SODIUM 75 MCG PO TABS: 75.0000 ug | ORAL_TABLET | Freq: Every day | ORAL | 1 refills | Status: DC

## 2019-05-11 NOTE — Progress Notes (Signed)
Benjamin Bryan is a 85 y.o. male who presents to [place]: [place] today for follow-up hypertension   Hypertension with CKD.  Patient has had significant difficulty tolerating antihypertensives.  At the last visit we decided to discontinue the antihypertensives.  He had a home blood pressure log that showed pretty good control of blood pressure without medication.  In the interim he notes that his blood pressure has been pretty well controlled at home.  He is interested in trying to restart amlodipine.  He had significant difficulty with lisinopril but is interested in low-dose amlodipine again.  Anticoagulation.  Patient has a history of provoked pulmonary embolism and has been on Eliquis now for about 7 months. He is having some blood tinged sputum with his cough.  This is been ongoing for a while now.  He is interested in discontinuing Eliquis.  Additionally about a month ago he had runny nose especially the morning and with eating.  He was thought to have vasomotor rhinitis and was prescribed Atrovent nasal spray.  In the interim he notes that it helps.   Additionally patient has well-controlled hypothyroidism.  He takes levothyroxine regularly.  He feels well with no issues.  No skin hair or nail change.  TSH was well controlled about 6 months ago.  ROS as above:  Exam:  BP (!) 155/83   Pulse 88   Temp 98.1 F (36.7 C) (Oral)   Wt 141 lb (64 kg)   BMI 23.46 kg/m   Wt Readings from Last 5 Encounters:  05/11/19 141 lb (64 kg)  04/07/19 140 lb (63.5 kg)  02/28/19 140 lb (63.5 kg)  02/08/19 144 lb (65.3 kg)  01/09/19 144 lb (65.3 kg)    Gen: Well NAD HEENT: EOMI,  MMM Lungs: Normal work of breathing. CTABL Heart: RRR no MRG Abd: NABS, Soft. Nondistended, Nontender Exts: Brisk capillary refill, warm and well perfused.   Lab and Radiology Results Chest x-ray from  February 2020 and CT scan from November 2019 reviewed.   Assessment and Plan: 85 y.o. male with  Hypertension with CKD: Blood pressure a bit elevated today but better controlled at home.  Plan to check metabolic panel again and restart low-dose amlodipine at 5 mg daily.  Anticoagulation: Patient now having more side effects from Eliquis.  He is taking it for at least 6 months.  I think it is reasonable to discontinue it with monitoring.  Blood-tinged sputum: Chronic ongoing issue slightly worse.  Patient has had relatively recent imaging which was benign.  Plan for discontinuation of Eliquis as above with watchful waiting.  Proceed with further work-up and evaluation if blood-tinged sputum continues.  Hypothyroidism: Doing well clinically.  Recheck TSH.  Vasomotor rhinitis: Also doing well with Atrovent nasal spray continue.  PDMP not reviewed this encounter. Orders Placed This Encounter  Procedures  . CBC  . COMPLETE METABOLIC PANEL WITH GFR  . TSH   Meds ordered this encounter  Medications  . levothyroxine (SYNTHROID) 75 MCG tablet    Sig: Take 1 tablet (75 mcg total) by mouth daily.    Dispense:  90 tablet    Refill:  1     Historical information moved to improve visibility of documentation.  Past Medical History:  Diagnosis Date  . Hyperlipidemia   . Hypertension   . Shingles   . Thyroid disease    Past Surgical History:  Procedure Laterality Date  . radical prostectomy  1994  . removed section of intestine  08/24/2005   polyp   Social History   Tobacco Use  . Smoking status: Former Smoker    Last attempt to quit: 12/11/1984    Years since quitting: 34.4  . Smokeless tobacco: Never Used  Substance Use Topics  . Alcohol use: Not Currently   family history includes Prostate cancer in his father; Stroke in his father.  Medications: Current Outpatient Medications  Medication Sig Dispense Refill  . amLODipine (NORVASC) 5 MG tablet Take 5 mg by mouth daily.     Marland Kitchen aspirin 81 MG tablet Take 81 mg by mouth daily.    Marland Kitchen atorvastatin (LIPITOR) 10 MG tablet TAKE 1 TABLET BY MOUTH ONCE DAILY 90 tablet 0  . famotidine (PEPCID) 20 MG tablet Take 20 mg by mouth daily.    . fexofenadine (ALLEGRA) 180 MG tablet Take 180 mg by mouth daily.    . Glucosamine-Chondroitin 1500-1200 MG/30ML LIQD Take by mouth. Take two tablets per day    . ipratropium (ATROVENT) 0.06 % nasal spray Place 2 sprays into both nostrils every 4 (four) hours as needed. 10 mL 6  . levothyroxine (SYNTHROID) 75 MCG tablet Take 1 tablet (75 mcg total) by mouth daily. 90 tablet 1   No current facility-administered medications for this visit.    No Known Allergies   Discussed warning signs or symptoms. Please see discharge instructions. Patient expresses understanding.

## 2019-05-12 LAB — COMPLETE METABOLIC PANEL WITH GFR
AG Ratio: 1.5 (calc) (ref 1.0–2.5)
ALT: 7 U/L — ABNORMAL LOW (ref 9–46)
AST: 10 U/L (ref 10–35)
Albumin: 3.9 g/dL (ref 3.6–5.1)
Alkaline phosphatase (APISO): 61 U/L (ref 35–144)
BUN/Creatinine Ratio: 12 (calc) (ref 6–22)
BUN: 19 mg/dL (ref 7–25)
CO2: 26 mmol/L (ref 20–32)
Calcium: 9.1 mg/dL (ref 8.6–10.3)
Chloride: 108 mmol/L (ref 98–110)
Creat: 1.61 mg/dL — ABNORMAL HIGH (ref 0.70–1.11)
GFR, Est African American: 44 mL/min/{1.73_m2} — ABNORMAL LOW (ref >=60)
GFR, Est Non African American: 38 mL/min/{1.73_m2} — ABNORMAL LOW (ref >=60)
Globulin: 2.6 g/dL (calc) (ref 1.9–3.7)
Glucose, Bld: 106 mg/dL — ABNORMAL HIGH (ref 65–99)
Potassium: 4.2 mmol/L (ref 3.5–5.3)
Sodium: 141 mmol/L (ref 135–146)
Total Bilirubin: 0.5 mg/dL (ref 0.2–1.2)
Total Protein: 6.5 g/dL (ref 6.1–8.1)

## 2019-05-12 LAB — CBC
HCT: 31.5 % — ABNORMAL LOW (ref 38.5–50.0)
Hemoglobin: 9.5 g/dL — ABNORMAL LOW (ref 13.2–17.1)
MCH: 25.3 pg — ABNORMAL LOW (ref 27.0–33.0)
MCHC: 30.2 g/dL — ABNORMAL LOW (ref 32.0–36.0)
MCV: 84 fL (ref 80.0–100.0)
MPV: 11.1 fL (ref 7.5–12.5)
Platelets: 358 10*3/uL (ref 140–400)
RBC: 3.75 10*6/uL — ABNORMAL LOW (ref 4.20–5.80)
RDW: 14.2 % (ref 11.0–15.0)
WBC: 5.4 10*3/uL (ref 3.8–10.8)

## 2019-05-12 LAB — TSH: TSH: 1.08 mIU/L (ref 0.40–4.50)

## 2019-05-19 ENCOUNTER — Other Ambulatory Visit: Payer: Self-pay | Admitting: Family Medicine

## 2019-08-13 IMAGING — DX DG CHEST 2V
2 series · 2 of 2 positions shown · non-contrast
Comparison: 09/28/2018.  CT 08/13/2009.

CLINICAL DATA: Blood-tinged sputum.

EXAM:
CHEST - 2 VIEW

[chest pa]
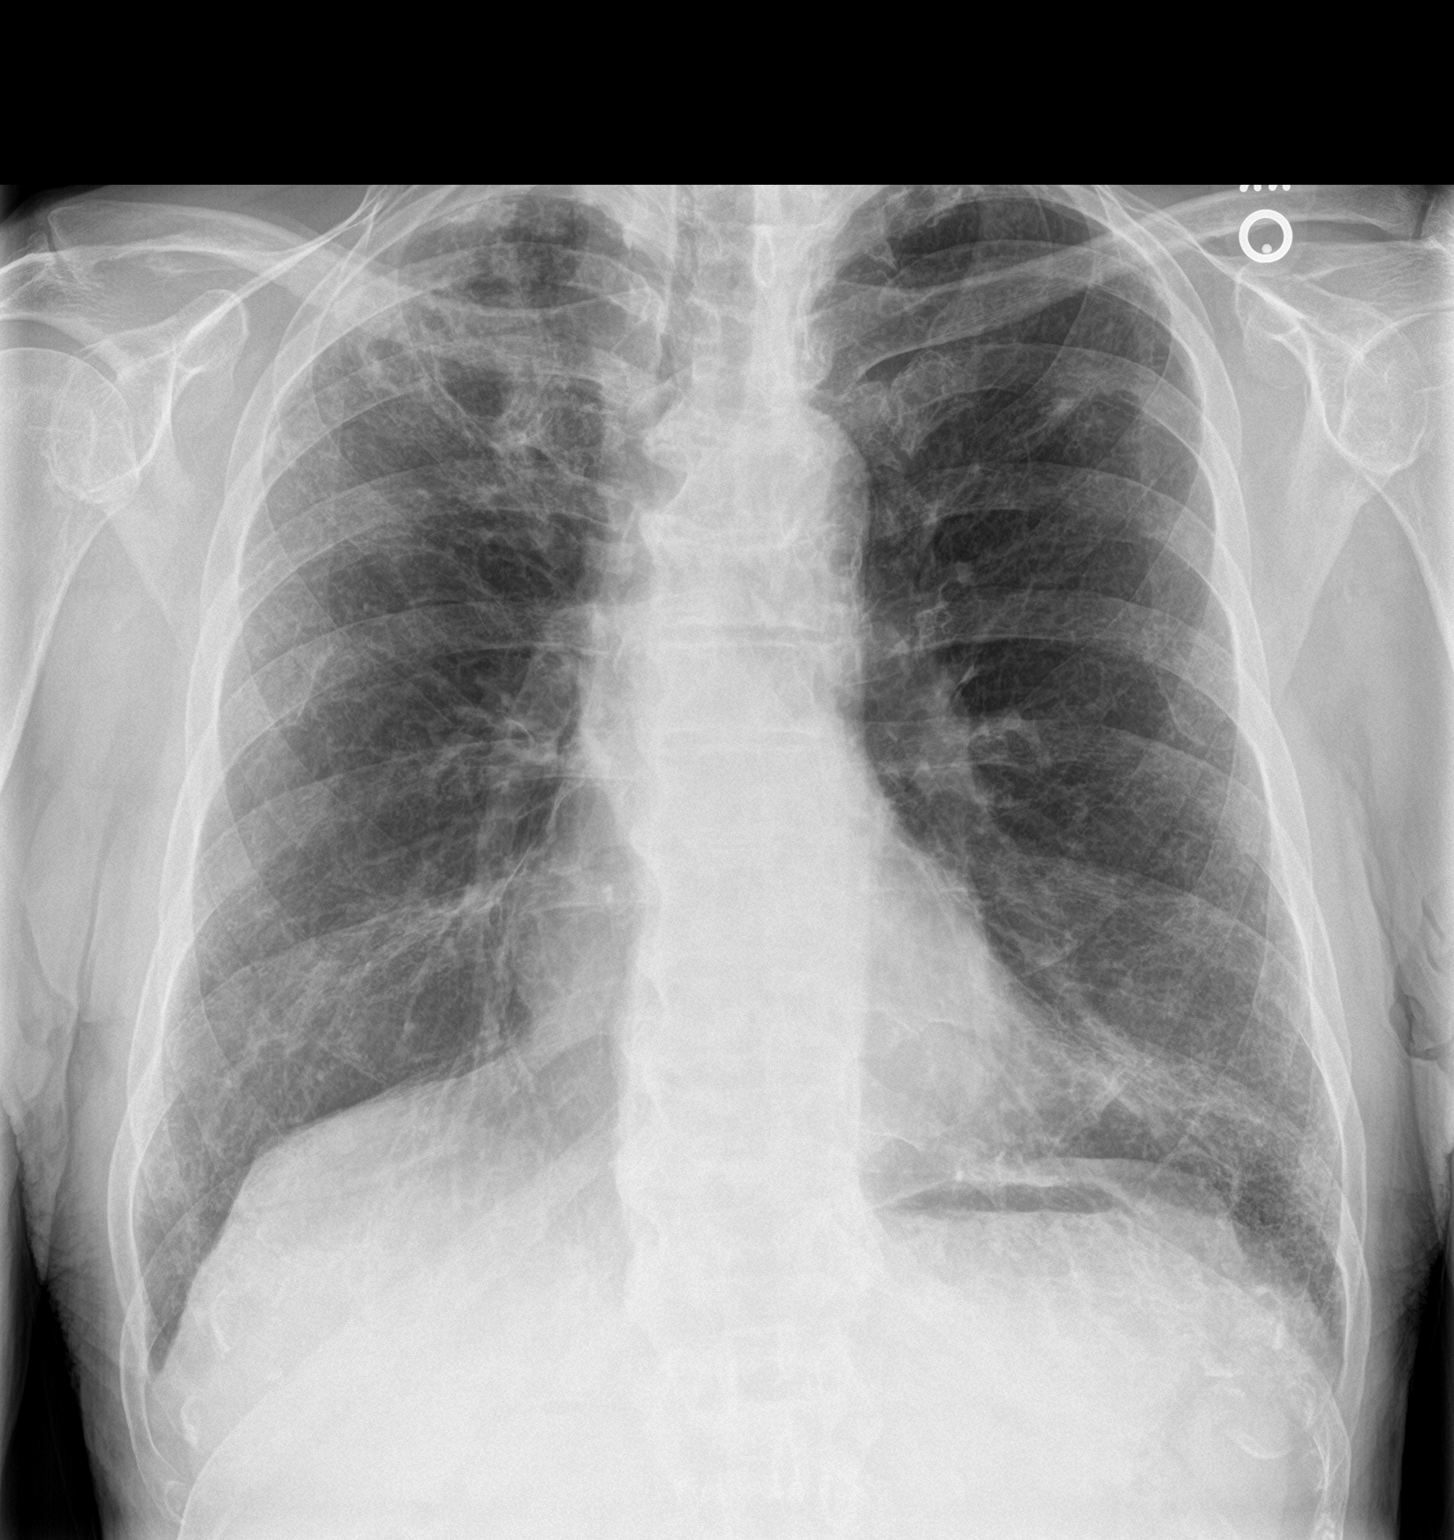

[chest lat]
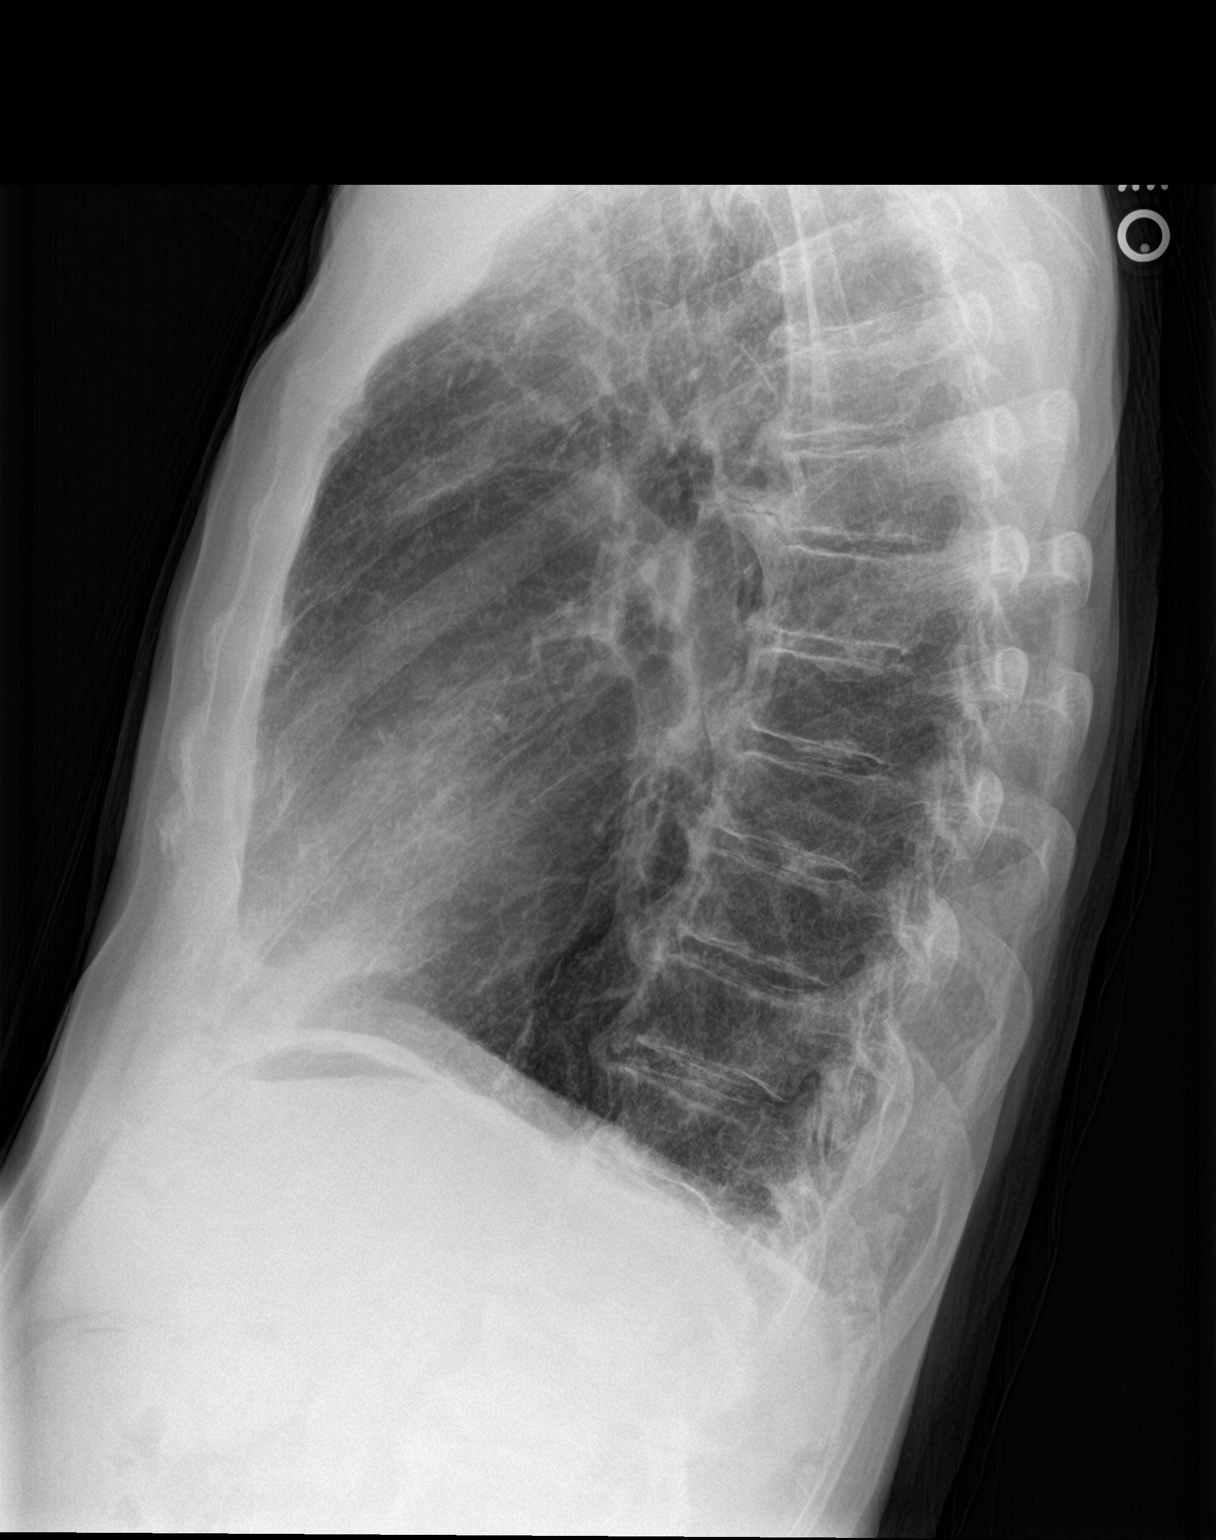

[2 of 2 positions shown; findings below may reference images not displayed]

FINDINGS: Mediastinum and hilar structures normal. Heart size normal.
Significant clearing of right upper lung infiltrate, mild residual.
Unchanged increased lung markings in the left lung base. These
changes could be related atelectasis, infiltrate, and or scarring.
No pleural effusion or pneumothorax. Biapical pleural parenchymal
thickening noted consistent with scarring. Degenerative change
thoracic spine.
IMPRESSION: 1. Significant clearing of right upper lung infiltrate. Mild
residual.

2. Unchanged increased lung markings in the left lung base. These
changes could be related atelectasis, infiltrate, and/or scarring.

## 2019-08-30 DIAGNOSIS — I160 Hypertensive urgency: Secondary | ICD-10-CM | POA: Insufficient documentation

## 2019-08-30 DIAGNOSIS — E785 Hyperlipidemia, unspecified: Secondary | ICD-10-CM | POA: Diagnosis not present

## 2019-08-30 DIAGNOSIS — Z87891 Personal history of nicotine dependence: Secondary | ICD-10-CM | POA: Diagnosis not present

## 2019-08-30 DIAGNOSIS — E7800 Pure hypercholesterolemia, unspecified: Secondary | ICD-10-CM | POA: Diagnosis not present

## 2019-08-30 DIAGNOSIS — E079 Disorder of thyroid, unspecified: Secondary | ICD-10-CM | POA: Diagnosis not present

## 2019-08-30 DIAGNOSIS — R079 Chest pain, unspecified: Secondary | ICD-10-CM | POA: Diagnosis not present

## 2019-08-30 DIAGNOSIS — I2510 Atherosclerotic heart disease of native coronary artery without angina pectoris: Secondary | ICD-10-CM | POA: Diagnosis not present

## 2019-08-30 DIAGNOSIS — R0789 Other chest pain: Secondary | ICD-10-CM | POA: Diagnosis not present

## 2019-08-30 DIAGNOSIS — Z86711 Personal history of pulmonary embolism: Secondary | ICD-10-CM | POA: Diagnosis not present

## 2019-08-30 DIAGNOSIS — J439 Emphysema, unspecified: Secondary | ICD-10-CM | POA: Diagnosis not present

## 2019-08-30 DIAGNOSIS — E039 Hypothyroidism, unspecified: Secondary | ICD-10-CM | POA: Diagnosis not present

## 2019-08-30 DIAGNOSIS — Z7901 Long term (current) use of anticoagulants: Secondary | ICD-10-CM | POA: Diagnosis not present

## 2019-08-30 DIAGNOSIS — Z79899 Other long term (current) drug therapy: Secondary | ICD-10-CM | POA: Diagnosis not present

## 2019-08-30 DIAGNOSIS — I4581 Long QT syndrome: Secondary | ICD-10-CM | POA: Diagnosis not present

## 2019-08-30 DIAGNOSIS — I10 Essential (primary) hypertension: Secondary | ICD-10-CM | POA: Diagnosis not present

## 2019-08-30 DIAGNOSIS — J984 Other disorders of lung: Secondary | ICD-10-CM | POA: Diagnosis not present

## 2019-08-30 HISTORY — DX: Hypertensive urgency: I16.0

## 2019-08-30 LAB — HEPATIC FUNCTION PANEL
ALT: 11 (ref 10–40)
AST: 16 (ref 14–40)
Alkaline Phosphatase: 70 (ref 25–125)
Bilirubin, Total: 0.4

## 2019-08-30 LAB — BASIC METABOLIC PANEL
BUN: 20 (ref 4–21)
Creatinine: 1.4 — AB (ref 0.6–1.3)
Glucose: 103
Potassium: 3.7 (ref 3.4–5.3)
Sodium: 139 (ref 137–147)

## 2019-08-30 LAB — CBC AND DIFFERENTIAL
HCT: 37 — AB (ref 41–53)
Hemoglobin: 12.1 — AB (ref 13.5–17.5)
Platelets: 297 (ref 150–399)
WBC: 6.9

## 2019-08-31 DIAGNOSIS — E785 Hyperlipidemia, unspecified: Secondary | ICD-10-CM | POA: Diagnosis not present

## 2019-08-31 DIAGNOSIS — I10 Essential (primary) hypertension: Secondary | ICD-10-CM | POA: Diagnosis not present

## 2019-08-31 DIAGNOSIS — J449 Chronic obstructive pulmonary disease, unspecified: Secondary | ICD-10-CM | POA: Diagnosis not present

## 2019-08-31 DIAGNOSIS — R079 Chest pain, unspecified: Secondary | ICD-10-CM | POA: Diagnosis not present

## 2019-08-31 DIAGNOSIS — J439 Emphysema, unspecified: Secondary | ICD-10-CM | POA: Diagnosis not present

## 2019-08-31 DIAGNOSIS — R0789 Other chest pain: Secondary | ICD-10-CM | POA: Diagnosis not present

## 2019-08-31 DIAGNOSIS — E039 Hypothyroidism, unspecified: Secondary | ICD-10-CM | POA: Diagnosis not present

## 2019-08-31 DIAGNOSIS — I160 Hypertensive urgency: Secondary | ICD-10-CM | POA: Diagnosis not present

## 2019-09-01 MED ORDER — ENOXAPARIN SODIUM 40 MG/0.4ML ~~LOC~~ SOLN
40.00 | SUBCUTANEOUS | Status: DC
Start: 2019-08-31 — End: 2019-09-01

## 2019-09-01 MED ORDER — NITROGLYCERIN 0.4 MG SL SUBL
0.40 | SUBLINGUAL_TABLET | SUBLINGUAL | Status: DC
Start: ? — End: 2019-09-01

## 2019-09-01 MED ORDER — TEMAZEPAM 15 MG PO CAPS
15.00 | ORAL_CAPSULE | ORAL | Status: DC
Start: ? — End: 2019-09-01

## 2019-09-01 MED ORDER — AMLODIPINE BESYLATE 5 MG PO TABS
5.00 | ORAL_TABLET | ORAL | Status: DC
Start: 2019-09-01 — End: 2019-09-01

## 2019-09-01 MED ORDER — LEVOTHYROXINE SODIUM 75 MCG PO TABS
75.00 | ORAL_TABLET | ORAL | Status: DC
Start: 2019-09-01 — End: 2019-09-01

## 2019-09-01 MED ORDER — GENERIC EXTERNAL MEDICATION
Status: DC
Start: ? — End: 2019-09-01

## 2019-09-01 MED ORDER — ALPRAZOLAM 0.25 MG PO TABS
0.25 | ORAL_TABLET | ORAL | Status: DC
Start: ? — End: 2019-09-01

## 2019-09-01 MED ORDER — DIPHENHYDRAMINE HCL 25 MG PO CAPS
25.00 | ORAL_CAPSULE | ORAL | Status: DC
Start: ? — End: 2019-09-01

## 2019-09-01 MED ORDER — PRAVASTATIN SODIUM 40 MG PO TABS
40.00 | ORAL_TABLET | ORAL | Status: DC
Start: 2019-08-31 — End: 2019-09-01

## 2019-09-01 MED ORDER — ASPIRIN EC 81 MG PO TBEC
81.00 | DELAYED_RELEASE_TABLET | ORAL | Status: DC
Start: 2019-09-01 — End: 2019-09-01

## 2019-09-01 MED ORDER — CLONIDINE HCL 0.1 MG PO TABS
0.10 | ORAL_TABLET | ORAL | Status: DC
Start: ? — End: 2019-09-01

## 2019-09-01 MED ORDER — SENNA-DOCUSATE SODIUM 8.6-50 MG PO TABS
1.00 | ORAL_TABLET | ORAL | Status: DC
Start: ? — End: 2019-09-01

## 2019-09-01 MED ORDER — METOPROLOL TARTRATE 25 MG PO TABS
25.00 | ORAL_TABLET | ORAL | Status: DC
Start: 2019-08-31 — End: 2019-09-01

## 2019-09-01 MED ORDER — SODIUM CHLORIDE 0.9 % IV SOLN
10.00 | INTRAVENOUS | Status: DC
Start: ? — End: 2019-09-01

## 2019-09-01 MED ORDER — METOCLOPRAMIDE HCL 5 MG/ML IJ SOLN
10.00 | INTRAMUSCULAR | Status: DC
Start: ? — End: 2019-09-01

## 2019-09-07 ENCOUNTER — Other Ambulatory Visit: Payer: Self-pay

## 2019-09-07 ENCOUNTER — Ambulatory Visit (INDEPENDENT_AMBULATORY_CARE_PROVIDER_SITE_OTHER): Payer: Medicare Other | Admitting: Family Medicine

## 2019-09-07 ENCOUNTER — Encounter: Payer: Self-pay | Admitting: Family Medicine

## 2019-09-07 VITALS — BP 165/77 | HR 86 | Wt 144.0 lb

## 2019-09-07 DIAGNOSIS — N1831 Chronic kidney disease, stage 3a: Secondary | ICD-10-CM | POA: Diagnosis not present

## 2019-09-07 DIAGNOSIS — I129 Hypertensive chronic kidney disease with stage 1 through stage 4 chronic kidney disease, or unspecified chronic kidney disease: Secondary | ICD-10-CM

## 2019-09-07 DIAGNOSIS — R002 Palpitations: Secondary | ICD-10-CM | POA: Diagnosis not present

## 2019-09-07 MED ORDER — ISOSORBIDE MONONITRATE ER 30 MG PO TB24: 30.0000 mg | ORAL_TABLET | Freq: Every day | ORAL | 1 refills | Status: DC

## 2019-09-07 NOTE — Progress Notes (Signed)
Benjamin Bryan is a 85 y.o. male who presents to [facility]: [place] today for follow up hospital visit for chest pain and hypertensive urgency.   Patient was admitted to the hospital on September 23 and discharged on September 24 for hypertensive urgency and chest pain.  Chest pain had resolved with aspirin and nitroglycerin.  EKG showed ST depression laterally. Troponin was negative.  CT angiogram was negative for PE.  On admit blood pressure was 211/98. Chest pain and blood pressure were controlled with nitroglycerin paste and home Norvasc 5 mg daily.  During hospitalization there was some concern that he has not been consistent with his medication and he was thought to be a somewhat poor historian.  Patient has no chest pain today and has not had recurrence of chest pain since being released from the hospital. BP elevated today to 165/77. He denies SOB, n/v, abdominal pain. He does report episodes of dizziness usually in the morning that he attributes to taking his amlodopine at night. Patient has no other concerns today.   ROS as above  Exam:  BP (!) 165/77    Pulse 86    Wt 144 lb (65.3 kg)    BMI 23.96 kg/m  Wt Readings from Last 5 Encounters:  09/07/19 144 lb (65.3 kg)  05/11/19 141 lb (64 kg)  04/07/19 140 lb (63.5 kg)  02/28/19 140 lb (63.5 kg)  02/08/19 144 lb (65.3 kg)    Gen: Well NAD HEENT: EOMI,  MMM Lungs: Normal work of breathing. CTABL Heart: Irregular rate and rhythm.  Abd: NABS, Soft. Nondistended, Nontender Exts: Brisk capillary refill, warm and well perfused.   Lab and Radiology Results  12-Lead EKG Normal sinus rhythm with frequent PVC with rate of 71 bpm.  Not significantly changed from baseline.  Possible evidence of old septal infarct.  Labs and xray from [facility]: No results found. Labs:  Results for orders placed or performed during the  hospital encounter of 08/30/19 (from the past 24 hour(s))  Troponin T  Collection Time: 08/30/19 7:59 AM  Result Value Ref Range  Trop T <0.010 <0.010  CBC And Differential  Collection Time: 08/30/19 7:59 AM  Result Value Ref Range  WBC 6.9 5.1 - 10.8 thou/mcL  RBC 3.97 (L) 4.05 - 5.64 million/mcL  HGB 12.1 (L) 13.5 - 17.5 gm/dL  HCT 37.2 (L) 40.5 - 52.5 %  MCV 94 83 - 97 fL  MCH 30.5 28.0 - 33.0 pg  MCHC 32.5 32.0 - 36.0 gm/dL  Plt Ct 297 150 - 400 thou/mcL  RDW SD 49.1 (H) 36.0 - 47.0 fL  MPV 10.5 8.9 - 11.0 fL  NRBC% 0.0 0 /100WBC  NRBC 0.000 0 thou/mcL  NEUTROPHIL % 54.9 50.0 - 70.0 %  LYMPHOCYTE % 29.2 25.0 - 40.0 %  MONOCYTE % 13.7 (H) 4.0 - 12.0 %  Eosinophil % 1.3 1.0 - 6.0 %  BASOPHIL % 0.6 0.0 - 2.0 %  IG% 0.300 0.001 - 0.429 %  ABSOLUTE NEUTROPHIL COUNT 3.79 1.50 - 7.50 thou/mcL  ABSOLUTE LYMPHOCYTE COUNT 2.0 1.0 - 4.5 thou/mcL  MONO ABSOLUTE 1.0 (H) 0.1 - 0.8 thou/mcL  EOS ABSOLUTE 0.1 0.0 - 0.5 thou/mcL  BASO ABSOLUTE 0.0 0.0 - 0.2 thou/mcL  IG ABSOLUTE 0.020 0.001 - 0.031 thou/mcL  Comprehensive Metabolic Panel  Collection Time: 08/30/19 7:59 AM  Result Value Ref Range  Na 139 136 - 146 mmol/L  Potassium 3.7 3.7 - 5.4  mmol/L  Cl 102 97 - 108 mmol/L  CO2 25 20 - 32 mmol/L  Glucose 103 (H) 65 - 99 mg/dL  BUN 20 8 - 27 mg/dL  Creatinine 1.39 (H) 0.76 - 1.27 mg/dL  Ca 9.4 8.6 - 10.2 mg/dL  ALK PHOS 70 25 - 160 U/L  T Bili 0.44 0.00 - 1.20 mg/dL  Total Protein 6.9 6.0 - 8.5 gm/dL  Alb 4.1 3.5 - 4.7 gm/dL  GLOBULIN 2.8 1.5 - 4.5 gm/dL  ALBUMIN/GLOBULIN RATIO 1.5 1.1 - 2.5  BUN/CREAT RATIO 14.4 11.0 - 26.0  ALT 11 0 - 55 U/L  AST 16 0 - 40 U/L  GFR AFRICAN AMERICAN 52 mL/min/1.91m  GFR Non African American 45 mL/min/1.781m AGAP 12 7 - 16 mmol/L  Magnesium  Collection Time: 08/30/19 7:59 AM  Result Value Ref Range  Mg 2.0 1.6 - 2.6 mg/dL  NT-proBNP  Collection Time: 08/30/19 7:59 AM  Result Value Ref Range  NT-ProBNP 1,674 <=1,799 pg/mL  Troponin  T  Collection Time: 08/30/19 11:31 AM  Result Value Ref Range  Trop T <0.010 <0.010   Imaging: Ct Angio Pulmonary  Result Date: 08/30/2019 CT PULMONARY ARTERIOGRAM WITH CONTRAST INDICATION: Chest pain, PE suspected, high prob. TECHNIQUE: Routine CT pulmonary angiogram with IV contrast was performed using nonionic contrast medium. Multiplanar 2D and angiographic 3D/MIP images were constructed on a non-independent workstation and reviewed. CT radiation dose reduction was utilized (automated exposure control, mA or kV adjustment based on patient size, or iterative image reconstruction). CONTRAST: Isovue-370, 65 mL. One or more of the following techniques were utilized for CT dose reduction: - Automated exposure control. - Adjustment of the mA or kV according to patient size. - Use of iterative image reconstruction technique. COMPARISON: Portable AP chest radiograph 08/30/2019. CT abdomen and pelvis without contrast 02/09/2019. CTPA 10/07/2018. FINDINGS: Trachea/central airways: Patent. Mediastinum/hila: No mass or lymphadenopathy. Heart: Coronary artery calcifications. Normal size. No pericardial effusion. Vessels: Suboptimal IV contrast bolus timing for the pulmonary arteries. Allowing for this, no definite pulmonary emboli. Aortic atherosclerosis. No aortic dissection, aneurysm or pseudoaneurysm. Lungs: Biapical pulmonary scarring and emphysematous changes (right greater than left). No definite mass or area of consolidation. Pleura: No pleural effusion. No pneumothorax. Skeletal: Degenerative spondylosis. No acute fracture or aggressive-appearing osseous lesion. Included upper abdomen: No acute abnormality. Several hypodense liver lesions are nonspecific and too small to characterize further. Statistically, these likely represent benign lesions, such as cysts or hemangiomas. LIMITATIONS: Suboptimal IV contrast bolus timing for the pulmonary arteries. Non-gated technique limits cardiac detail.   IMPRESSION: 1.  Suboptimal IV contrast bolus timing for the pulmonary arteries. Allowing for this, no definite pulmonary emboli. 2. Nonacute findings, as above. Electronically Signed by: ChMaliolder  Xr Chest Ap Portable  Result Date: 08/30/2019 SINGLE VIEW AP CHEST INDICATION: Chest Pain COMPARISON: 02/09/2019. FINDINGS: No focal airspace opacity. No effusion or pneumothorax evident. The cardiomediastinal silhouette is within normal limits.   IMPRESSION: No acute cardiopulmonary disease. Electronically Signed by: LaWende BushyECG: Ecg 12 Lead  Result Date: 08/30/2019 Diagnosis Class Abnormal Acquisition Device D3K Ventricular Rate 97 Atrial Rate 85 P-R Interval 160 QRS Duration 106 Q-T Interval 372 QTC Calculation(Bazett) 472 Calculated P Axis 70 Calculated R Axis 54 Calculated T Axis 36 Diagnosis ### Age and gender specific ECG analysis ### Sinus rhythm with marked sinus arrhythmia with frequent and consecutive premature ventricular complexes and fusion complexes Possible Left atrial enlargement Nonspecific ST and T wave abnormality Prolonged QT Abnormal ECG When compared with  ECG of 10-Feb-2019 08:20, fusion complexes are now present ST now depressed in Lateral leads Eben Burow ([id number]) on 7/54/4920 1:00:71 AM certifies that he/she has reviewed the ECG tracing and confirms the independent interpretation is correct. Assessment and Plan: 85 y.o. male here for follow up of recent hospitalization due to chest pain and hypertensive urgency.   No chest pain since his initial ED evaluation.  However blood pressure remains elevated.  He has had difficulty tolerating antihypertensive medications in the past.  Plan to start Isosorbide mononitrate 30 mg daily for chest discomfort. Encouraged patient to keep track of BP over next few weeks. Will follow up in 3 weeks for BP recheck via nurse visit.  PDMP not reviewed this encounter. Orders Placed This Encounter  Procedures   CBC and differential    This external order  was created through the Results Console.   Basic metabolic panel    This external order was created through the Results Console.   Hepatic function panel    This external order was created through the Results Console.   Meds ordered this encounter  Medications   isosorbide mononitrate (IMDUR) 30 MG 24 hr tablet    Sig: Take 1 tablet (30 mg total) by mouth daily.    Dispense:  90 tablet    Refill:  1     Historical information moved to improve visibility of documentation.  Past Medical History:  Diagnosis Date   Hyperlipidemia    Hypertension    Shingles    Thyroid disease    Past Surgical History:  Procedure Laterality Date   radical prostectomy  1994   removed section of intestine  08/24/2005   polyp   Social History   Tobacco Use   Smoking status: Former Smoker    Quit date: 12/11/1984    Years since quitting: 34.7   Smokeless tobacco: Never Used  Substance Use Topics   Alcohol use: Not Currently   family history includes Prostate cancer in his father; Stroke in his father.  Medications: Current Outpatient Medications  Medication Sig Dispense Refill   amLODipine (NORVASC) 5 MG tablet Take 5 mg by mouth daily.     atorvastatin (LIPITOR) 10 MG tablet Take 1 tablet by mouth once daily 90 tablet 1   famotidine (PEPCID) 20 MG tablet Take 20 mg by mouth daily.     Glucosamine-Chondroitin 1500-1200 MG/30ML LIQD Take by mouth. Take two tablets per day     ipratropium (ATROVENT) 0.06 % nasal spray Place 2 sprays into both nostrils every 4 (four) hours as needed. 10 mL 6   levothyroxine (SYNTHROID) 75 MCG tablet Take 1 tablet (75 mcg total) by mouth daily. 90 tablet 1   No current facility-administered medications for this visit.    No Known Allergies   Discussed warning signs or symptoms. Please see discharge instructions. Patient expresses understanding.  I personally was present and performed or re-performed the history, physical exam and medical  decision-making activities of this service and have verified that the service and findings are accurately documented in the student's note. ___________________________________________ Lynne Leader M.D., ABFM., CAQSM. Primary Care and Sports Medicine Adjunct Instructor of [place] [employer]

## 2019-09-07 NOTE — Progress Notes (Signed)
Note duplication 

## 2019-09-07 NOTE — Patient Instructions (Addendum)
Thank you for coming in today.  Plan to continue amlodipine 5 mg daily. Add isosorbide daily as well. Keep track of blood pressure.  If your usual lightheadedness or dizziness worsens with isosorbide let me know and stop the medication. Please schedule a nurse visit in a few weeks for blood pressure recheck.  Bring your home blood pressure cuff with you to the appointment.  I think Dr. Luetta Nutting will be a good fit for you.  He should be ready to see patients in my clinic in about 4 to 6 months.  I will be moving to full time Sports Medicine in [place] starting on November 1st.  You will still be able to see me for your Sports Medicine or Orthopedic needs at [facility] in [place]. I will still be part of [place].    If you want to stay locally for your Sports Medicine issues Dr. Dianah Field here in [place] will be happy to see you.  Additionally Dr. Clearance Coots at [facility] will be happy to see you for sports medicine issues more locally.   For your primary care needs you are welcome to establish care with Dr. Emeterio Reeve.  We are working quickly to hire more physicians to cover the primary care needs however if you cannot get an appointment with Dr. Sheppard Coil in a timely manner [place] has locations and openings for primary care services nearby.   [facility] at [facility] [address] . [employer] , [place]: [phone] . Behavioral Medicine: [phone] . Fax: [place] at [employer] [address] . [place], [place]: [phone] . Behavioral Medicine: [phone] . Fax: [phone] . Hours (M-F): 7am - Academic librarian At [facility]. [place], [place]: [phone] . Behavioral Medicine: [phone] . Fax: [phone] . Hours (M-F): 8am - Optician, dispensing at  [employer] . [place], [place] Phone: [phone] . Behavioral Medicine: [phone] . Fax: [phone]

## 2019-09-28 ENCOUNTER — Ambulatory Visit (INDEPENDENT_AMBULATORY_CARE_PROVIDER_SITE_OTHER): Payer: Medicare Other | Admitting: Family Medicine

## 2019-09-28 VITALS — BP 131/72 | HR 92

## 2019-09-28 DIAGNOSIS — I129 Hypertensive chronic kidney disease with stage 1 through stage 4 chronic kidney disease, or unspecified chronic kidney disease: Secondary | ICD-10-CM | POA: Diagnosis not present

## 2019-09-28 DIAGNOSIS — N1831 Chronic kidney disease, stage 3a: Secondary | ICD-10-CM | POA: Diagnosis not present

## 2019-09-28 NOTE — Progress Notes (Signed)
Patient is here for blood pressure check. Denies dizziness, palpitations or medication problems. He is taking medications as directed.  His first blood pressure reading in the office is 152/60.  He brings his blood pressure machine from home and the reading is 131/68.  On recheck with our machine his blood pressure is 131/72.  His home readings are as follows:  09/28/2019: 129/48 09/27/2019: 126/51 09/26/2019: 115/51 09/25/2019: 127/49 09/24/2019: 120/56 09/23/2019: 160/67 09/22/2019: 129/63 09/21/2019: 118/59  Copy of patient's home log sent to scan. Confirmed with Dr. Georgina Snell patient's blood pressure readings and no changes were made. Patient will follow up as scheduled.

## 2019-10-04 NOTE — Addendum Note (Signed)
Addended by: Dessie Coma on: 10/04/2019 01:58 PM   Modules accepted: Orders

## 2019-10-10 ENCOUNTER — Emergency Department
Admission: EM | Admit: 2019-10-10 | Discharge: 2019-10-10 | Disposition: A | Discharge status: Home / Self Care | Payer: Medicare Other | Source: Home / Self Care

## 2019-10-10 ENCOUNTER — Telehealth: Payer: Self-pay | Admitting: Physician Assistant

## 2019-10-10 ENCOUNTER — Other Ambulatory Visit: Payer: Self-pay

## 2019-10-10 ENCOUNTER — Encounter: Payer: Self-pay | Admitting: Family Medicine

## 2019-10-10 DIAGNOSIS — R42 Dizziness and giddiness: Secondary | ICD-10-CM

## 2019-10-10 DIAGNOSIS — I4949 Other premature depolarization: Secondary | ICD-10-CM

## 2019-10-10 DIAGNOSIS — I493 Ventricular premature depolarization: Secondary | ICD-10-CM

## 2019-10-10 NOTE — ED Provider Notes (Addendum)
Benjamin Bryan CARE    CSN: [id number] Arrival date & time: 10/10/19  1621      History   Chief Complaint Chief Complaint  Patient presents with  . Dizziness    HPI Benjamin Bryan is a 85 y.o. male.   This is the initial [facility] patient visit for this 85 yo man complaining of dizziness.  This is the second bout of lightheadedness that the patient has had in 10 days.  Each episode lasted about a minute and forced the patient to sit down.  Today, he has felt weak all day and somewhat short of breath with any activity.  He's had no chest pain, nausea, diaphoresis.  Patient has spells of diplopia very rarely, but more frequently has weak spells with mild shortness of breath.  Past medical hx:  COPD, h/o neurologic orthostatic hypotension, hypertension with kidney disease stage 3,  Iron deficiency anemia, and h/o pulmonary embolus Patient has undergone radical prostatectomy for prostate CA He currently takes amlodipine, atorvastatin, levothyroxine, Imdur, and famotidine   Patient had CBC, TSH and liver function tests done 6 weeks ago and the results were the best in a year.     Past Medical History:  Diagnosis Date  . Hyperlipidemia   . Hypertension   . Shingles   . Thyroid disease     Patient Active Problem List   Diagnosis Date Noted  . Iron deficiency anemia 12/09/2018  . History of pulmonary embolus (PE) 10/07/2018  . Atherosclerosis of aortic bifurcation and common iliac arteries ([place]) 01/26/2018  . COPD (chronic obstructive pulmonary disease) ([place]) 10/03/2015  . History of prostate cancer 10/03/2015  . Hypothyroidism 09/11/2015  . Hyperlipidemia 09/11/2015  . Frequent PVCs 09/11/2015  . Hypertensive kidney disease with stage 3 chronic kidney disease 09/11/2015  . History of TIA (transient ischemic attack) 09/11/2015  . Neurologic orthostatic hypotension ([place]) 09/12/2014  . Neuralgia, neuritis, and radiculitis, unspecified 03/20/2014    Past Surgical History:   Procedure Laterality Date  . radical prostectomy  1994  . removed section of intestine  08/24/2005   polyp       Home Medications    Prior to Admission medications   Medication Sig Start Date End Date Taking? Authorizing Provider  amLODipine (NORVASC) 5 MG tablet Take 5 mg by mouth daily.    [provider]  atorvastatin (LIPITOR) 10 MG tablet Take 1 tablet by mouth once daily 05/22/19   Gregor Hams, MD  famotidine (PEPCID) 20 MG tablet Take 20 mg by mouth daily.    [provider]  Glucosamine-Chondroitin 1500-1200 MG/30ML LIQD Take by mouth. Take two tablets per day    [provider]  ipratropium (ATROVENT) 0.06 % nasal spray Place 2 sprays into both nostrils every 4 (four) hours as needed. 04/07/19   Gregor Hams, MD  isosorbide mononitrate (IMDUR) 30 MG 24 hr tablet Take 1 tablet (30 mg total) by mouth daily. 09/07/19   Gregor Hams, MD  levothyroxine (SYNTHROID) 75 MCG tablet Take 1 tablet (75 mcg total) by mouth daily. 05/11/19   Gregor Hams, MD    Family History Family History  Problem Relation Age of Onset  . Prostate cancer Father   . Stroke Father     Social History Social History   Tobacco Use  . Smoking status: Former Smoker    Quit date: 12/11/1984    Years since quitting: 34.8  . Smokeless tobacco: Never Used  Substance Use Topics  . Alcohol use:  Not Currently  . Drug use: No     Allergies   Patient has no known allergies.   Review of Systems Review of Systems   Physical Exam Triage Vital Signs ED Triage Vitals  Enc Vitals Group     BP      Pulse      Resp      Temp      Temp src      SpO2      Weight      Height      Head Circumference      Peak Flow      Pain Score      Pain Loc      Pain Edu?      Excl. in [place]?    Orthostatic VS for the past 24 hrs:  BP- Lying Pulse- Lying BP- Sitting Pulse- Sitting BP- Standing at 0 minutes Pulse- Standing at 0 minutes  10/10/19 1723 (!) 197/92 70 (!) 209/95 71 (!)  206/95 76    Updated Vital Signs BP (!) 222/94 (BP Location: Right Arm)   Pulse (!) 59   Temp 98.3 F (36.8 C) (Oral)   Ht 5\' 5"  (1.651 m)   Wt 65.8 kg   SpO2 99%   BMI 24.13 kg/m    Physical Exam Vitals signs and nursing note reviewed.  Constitutional:      General: He is not in acute distress.    Appearance: Normal appearance. He is normal weight. He is not ill-appearing, toxic-appearing or diaphoretic.  HENT:     Head: Normocephalic.  Eyes:     Conjunctiva/sclera: Conjunctivae normal.  Neck:     Musculoskeletal: Normal range of motion and neck supple.     Vascular: No carotid bruit.  Cardiovascular:     Rate and Rhythm: Normal rate. Rhythm irregular.     Heart sounds: Normal heart sounds. No murmur.  Pulmonary:     Effort: Pulmonary effort is normal.     Breath sounds: Normal breath sounds.  Abdominal:     General: Abdomen is flat.     Palpations: There is no mass.     Tenderness: There is no abdominal tenderness. There is no guarding or rebound.  Musculoskeletal: Normal range of motion.  Lymphadenopathy:     Cervical: No cervical adenopathy.  Skin:    General: Skin is warm and dry.  Neurological:     General: No focal deficit present.     Mental Status: He is alert and oriented to person, place, and time.  Psychiatric:        Mood and Affect: Mood normal.        Behavior: Behavior normal.        Thought Content: Thought content normal.      UC Treatments / Results  Labs (all labs ordered are listed, but only abnormal results are displayed) Labs Reviewed - No data to display  EKG On October 1st, patient had an EKG showing an irregular pattern with multiple PVC's and occasional sinus conducted beats.  EKG done today shows multifocal PVC's with pairing.  Radiology No results found.  Procedures Procedures (including critical care time)  Medications Ordered in UC Medications - No data to display  Initial Impression / Assessment and Plan / UC  Course  I have reviewed the triage vital signs and the nursing notes.  Pertinent labs & imaging results that were available during my care of the patient were reviewed by me and considered in my medical  decision making (see chart for details).    Final Clinical Impressions(s) / UC Diagnoses   Final diagnoses:  Multifocal PVCs with pairing  Dizziness     Discharge Instructions     We will make an appointment with a heart doctor in [place] or here in [place] when their offices open in the morning.  You have an irregular heart beat that needs a cardiologist to give the best advice on the next step.    ED Prescriptions    None     I have reviewed the PDMP during this encounter.   Robyn Haber, MD 10/10/19 Marcene Duos    Robyn Haber, MD 10/10/19 1734

## 2019-10-10 NOTE — Discharge Instructions (Addendum)
We will make an appointment with a heart doctor in [place] or here in [place] when their offices open in the morning.  You have an irregular heart beat that needs a cardiologist to give the best advice on the next step.

## 2019-10-10 NOTE — Telephone Encounter (Signed)
Patient called about dizzy spells and almost passing out. Luvenia Starch advised to send him to the ED or  UC. Patient was told to come to the urgent care as soon as possible.

## 2019-10-10 NOTE — Telephone Encounter (Signed)
Agree with plan 

## 2019-10-10 NOTE — ED Triage Notes (Signed)
Dizziness started Oct 25, laid down in the floor to avoid falling. Same thing happened today, he caught himselff and wife helped him to a chair.  He has not had LOC

## 2019-10-11 ENCOUNTER — Telehealth: Payer: Self-pay

## 2019-10-11 NOTE — Telephone Encounter (Signed)
Per Dr Joseph Art, appointment made for Nov. 11 at 10 am with Dr Harriet Masson.  Pt made aware.

## 2019-10-17 ENCOUNTER — Other Ambulatory Visit: Payer: Self-pay | Admitting: Family Medicine

## 2019-10-18 ENCOUNTER — Encounter: Payer: Self-pay | Admitting: *Deleted

## 2019-10-18 ENCOUNTER — Other Ambulatory Visit: Payer: Self-pay

## 2019-10-18 ENCOUNTER — Ambulatory Visit (INDEPENDENT_AMBULATORY_CARE_PROVIDER_SITE_OTHER): Payer: Medicare Other | Admitting: Cardiology

## 2019-10-18 ENCOUNTER — Ambulatory Visit (INDEPENDENT_AMBULATORY_CARE_PROVIDER_SITE_OTHER): Payer: Medicare Other

## 2019-10-18 VITALS — BP 150/60 | HR 82 | Ht 65.0 in | Wt 147.0 lb

## 2019-10-18 DIAGNOSIS — I708 Atherosclerosis of other arteries: Secondary | ICD-10-CM | POA: Diagnosis not present

## 2019-10-18 DIAGNOSIS — I700 Atherosclerosis of aorta: Secondary | ICD-10-CM

## 2019-10-18 DIAGNOSIS — Z8673 Personal history of transient ischemic attack (TIA), and cerebral infarction without residual deficits: Secondary | ICD-10-CM

## 2019-10-18 DIAGNOSIS — I10 Essential (primary) hypertension: Secondary | ICD-10-CM

## 2019-10-18 DIAGNOSIS — E782 Mixed hyperlipidemia: Secondary | ICD-10-CM | POA: Diagnosis not present

## 2019-10-18 DIAGNOSIS — I493 Ventricular premature depolarization: Secondary | ICD-10-CM

## 2019-10-18 MED ORDER — CARVEDILOL 3.125 MG PO TABS: 3.1250 mg | ORAL_TABLET | Freq: Two times a day (BID) | ORAL | 3 refills | Status: DC

## 2019-10-18 NOTE — Progress Notes (Signed)
Cardiology Office Note:    Date:  10/18/2019   ID:  Benjamin Bryan, DOB [birth year], MRN [id number]  PCP:  Gregor Hams, MD  Cardiologist:  No primary care provider on file.  Electrophysiologist:  None   Referring MD: Gregor Hams, MD   Chief Complaint  Patient presents with  . Dizziness    History of Present Illness:    Benjamin Bryan is a 85 y.o. male with a hx of hypertension, hyperlipidemia, peripheral artery disease, history of TIA history of chronic kidney disease stage III, and history of frequent PVCs presents to be evaluated for dizziness.  The patient tells me that in addition to his uncontrolled blood pressure he has been experiencing subtle intermittent dizziness.  He notes that usually is when he first wakes up in the morning to take his time prior to getting out of bed.  He says this resolved over the course of the day.  He also tells me that he was taking amlodipine 5 mg daily which was stopped and was started on Imdur 3 mg daily.  Says for the last several weeks he has not been taking any antihypertensive.  Today he is concerned his elevated blood pressure.  He offers no other complaints at this time.  Past Medical History:  Diagnosis Date  . Atherosclerosis of aortic bifurcation and common iliac arteries ([place]) 01/26/2018   Seen on xray 01/26/18. Normal ABI study 2018  . COPD (chronic obstructive pulmonary disease) ([place]) 10/03/2015  . Frequent PVCs 09/11/2015  . History of prostate cancer 10/03/2015   Radical prostectomy    . History of pulmonary embolus (PE) 10/07/2018   Late October 2019 small clot burden  . History of TIA (transient ischemic attack) 09/11/2015  . Hyperlipidemia   . Hypertension   . Hypertensive kidney disease with stage 3 chronic kidney disease 09/11/2015  . Hypertensive urgency 08/30/2019  . Hypothyroidism 09/11/2015  . Iron deficiency anemia 12/09/2018  . Neuralgia, neuritis, and radiculitis, unspecified 03/20/2014  . Neurologic orthostatic  hypotension ([place]) 09/12/2014  . Shingles   . Thyroid disease     Past Surgical History:  Procedure Laterality Date  . radical prostectomy  1994  . removed section of intestine  08/24/2005   polyp    Current Medications: Current Meds  Medication Sig  . atorvastatin (LIPITOR) 10 MG tablet Take 1 tablet by mouth once daily  . famotidine (PEPCID) 20 MG tablet Take 20 mg by mouth daily.  . Glucosamine-Chondroitin 1500-1200 MG/30ML LIQD Take by mouth. Take two tablets per day  . ipratropium (ATROVENT) 0.06 % nasal spray Place 2 sprays into both nostrils every 4 (four) hours as needed.  Marland Kitchen levothyroxine (SYNTHROID) 75 MCG tablet Take 1 tablet (75 mcg total) by mouth daily.     Allergies:   Patient has no known allergies.   Social History   Socioeconomic History  . Marital status: Married    Spouse name: Romie Minus  . Number of children: 1  . Years of education: 86  . Highest education level: 12th grade  Occupational History  . Occupation: retired    Comment: Facilities manager Needs  . Financial resource strain: Not hard at all  . Food insecurity    Worry: Never true    Inability: Never true  . Transportation needs    Medical: No    Non-medical: No  Tobacco Use  . Smoking status: Former Smoker    Quit date: 12/11/1984    Years since quitting: 34.8  .  Smokeless tobacco: Never Used  Substance and Sexual Activity  . Alcohol use: Not Currently  . Drug use: No  . Sexual activity: Never    Partners: Female  Lifestyle  . Physical activity    Days per week: 0 days    Minutes per session: 0 min  . Stress: Not at all  Relationships  . Social Herbalist on phone: Once a week    Gets together: Once a week    Attends religious service: Never    Active member of club or organization: No    Attends meetings of clubs or organizations: Never    Relationship status: Married  Other Topics Concern  . Not on file  Social History Narrative   Drinks caffeine during the day-  coffee. Will go walking in neighborhood when the weather is decent.     Family History: The patient's family history includes Prostate cancer in his father; Stroke in his father.  ROS:   Review of Systems  Constitution: Negative for decreased appetite, fever and weight gain.  HENT: Negative for congestion, ear discharge, hoarse voice and sore throat.   Eyes: Negative for discharge, redness, vision loss in right eye and visual halos.  Cardiovascular: Negative for chest pain, dyspnea on exertion, leg swelling, orthopnea and palpitations.  Respiratory: Negative for cough, hemoptysis, shortness of breath and snoring.   Endocrine: Negative for heat intolerance and polyphagia.  Hematologic/Lymphatic: Negative for bleeding problem. Does not bruise/bleed easily.  Skin: Negative for flushing, nail changes, rash and suspicious lesions.  Musculoskeletal: Negative for arthritis, joint pain, muscle cramps, myalgias, neck pain and stiffness.  Gastrointestinal: Negative for abdominal pain, bowel incontinence, diarrhea and excessive appetite.  Genitourinary: Negative for decreased libido, genital sores and incomplete emptying.  Neurological: Negative for brief paralysis, focal weakness, headaches and loss of balance.  Psychiatric/Behavioral: Negative for altered mental status, depression and suicidal ideas.  Allergic/Immunologic: Negative for HIV exposure and persistent infections.    EKGs/Labs/Other Studies Reviewed:    The following studies were reviewed today:   EKG:  The ekg ordered today demonstrates   Recent Labs: 05/11/2019: TSH 1.08 08/30/2019: ALT 11; BUN 20; Creatinine 1.4; Hemoglobin 12.1; Platelets 297; Potassium 3.7; Sodium 139  Recent Lipid Panel    Component Value Date/Time   CHOL 105 02/10/2019   TRIG 46 02/10/2019   HDL 44 02/10/2019   CHOLHDL 2.5 01/26/2018 1129   VLDL 18 09/16/2016 0831   LDLCALC 52 02/10/2019   LDLCALC 70 01/26/2018 1129    Physical Exam:    VS:  BP  (!) 150/60 (BP Location: Right Arm, Patient Position: Sitting, Cuff Size: Normal)   Pulse 82   Ht 5\' 5"  (1.651 m)   Wt 147 lb (66.7 kg)   SpO2 98%   BMI 24.46 kg/m     Wt Readings from Last 3 Encounters:  10/18/19 147 lb (66.7 kg)  10/10/19 145 lb (65.8 kg)  09/07/19 144 lb (65.3 kg)     GEN: Well nourished, well developed in no acute distress HEENT: Normal NECK: No JVD; No carotid bruits LYMPHATICS: No lymphadenopathy CARDIAC: S1S2 noted,RRR, no murmurs, rubs, gallops RESPIRATORY:  Clear to auscultation without rales, wheezing or rhonchi  ABDOMEN: Soft, non-tender, non-distended, +bowel sounds, no guarding. EXTREMITIES: No edema, No cyanosis, no clubbing MUSCULOSKELETAL:  No edema; No deformity  SKIN: Warm and dry NEUROLOGIC:  Alert and oriented x 3, non-focal PSYCHIATRIC:  Normal affect, good insight  ASSESSMENT:    1. Essential hypertension  2. Frequent PVCs   3. Mixed hyperlipidemia   4. History of TIA (transient ischemic attack)   5. Atherosclerosis of aortic bifurcation and common iliac arteries ([place])    PLAN:    1.  Dizziness-I am concerned that his frequent PVCs causing the symptoms intermittently.  The EKG shows evidence of recurrent PVCs.  At this time, I would like to place a ZIO monitor on patient for 3 days to understand his PVC burden.  In addition I would like to control symptoms with using a rate control agent to help slow the PVC burden which I am hoping will help resolve this dizziness.  I am opting for Coreg 3.125 mg twice daily as this will also help his blood pressure.  I have educated patient if he start his medicine and the dizziness get worse to please notify my office.  But I am very optimistic that controlling the PVC may help with the symptoms.  In the meantime an echocardiogram has been ordered to assess his LV/RV function as well as any structural abnormality  2.  Accelerated hypertension-starting Coreg 3.125 twice daily. He will stop Amlodipine.   3.  Hyperlipidemia he will continue on his statin regimen.  4.  There is a history of TIA the patient and his wife does not remember this occurrence he is not on aspirin 81 mg daily.  I am going to do CBC today if the patient hemoglobin is stable benefit from starting aspirin 81 mg daily.   5. Blood work today BMP, mg, cbc.  The patient is in agreement with the above plan. The patient left the office in stable condition.  The patient will follow up in 1 month.  Medication Adjustments/Labs and Tests Ordered: Current medicines are reviewed at length with the patient today.  Concerns regarding medicines are outlined above.  Orders Placed This Encounter  Procedures  . Basic Metabolic Panel (BMET)  . Magnesium  . CBC  . ZIO LONG TERM MONITOR (3-14 DAYS)  . EKG 12-Lead  . ECHOCARDIOGRAM COMPLETE   Meds ordered this encounter  Medications  . carvedilol (COREG) 3.125 MG tablet    Sig: Take 1 tablet (3.125 mg total) by mouth 2 (two) times daily.    Dispense:  60 tablet    Refill:  3    Patient Instructions  Medication Instructions:  Your physician has recommended you make the following change in your medication:  START COREG 3.125 MG TWICE DAILY  *If you need a refill on your cardiac medications before your next appointment, please call your pharmacy*  Lab Work: Your physician recommends that you have labs drawn today: BMET Magnesium CBC  If you have labs (blood work) drawn today and your tests are completely normal, you will receive your results only by: Marland Kitchen MyChart Message (if you have MyChart) OR . A paper copy in the mail If you have any lab test that is abnormal or we need to change your treatment, we will call you to review the results.  Testing/Procedures: Your physician has requested that you have an echocardiogram. Echocardiography is a painless test that uses sound waves to create images of your heart. It provides your doctor with information about the size and shape of  your heart and how well your heart's chambers and valves are working. This procedure takes approximately one hour. There are no restrictions for this procedure.  Your physician has recommended that you wear a Zio Monitor for 3 days. These monitors are medical devices that record  the heart's electrical activity. Doctors most often use these monitors to diagnose arrhythmias. Arrhythmias are problems with the speed or rhythm of the heartbeat. The monitor is a small, portable device. You can wear one while you do your normal daily activities. This is usually used to diagnose what is causing palpitations/syncope (passing out).   Follow-Up: At [facility], you and your health needs are our priority.  As part of our continuing mission to provide you with exceptional heart care, we have created designated Provider Care Teams.  These Care Teams include your primary Cardiologist (physician) and Advanced Practice Providers (APPs -  Physician Assistants and Nurse Practitioners) who all work together to provide you with the care you need, when you need it.  Your next appointment:   1 month  The format for your next appointment:   In Person  Provider:   You may see Dr. Harriet Masson or the following Advanced Practice Provider on your designated Care Team:    Laurann Montana, FNP   Other Instructions   Ambulatory Cardiac Monitoring An ambulatory cardiac monitor is a small recording device that is used to detect abnormal heart rhythms (arrhythmias). Most monitors are connected by wires to flat, sticky disks (electrodes) that are then attached to your chest. You may need to wear a monitor if you have had symptoms such as:  Fast heartbeats (palpitations).  Dizziness.  Fainting or light-headedness.  Unexplained weakness.  Shortness of breath. There are several types of monitors. Some common monitors include:  Holter monitor. This records your heart rhythm continuously, usually for 24-48 hours.  Event  (episodic) monitor. This monitor has a symptoms button, and when pushed, it will begin recording. You need to activate this monitor to record when you have a heart-related symptom.  Automatic detection monitor. This monitor will begin recording when it detects an abnormal heartbeat. What are the risks? Generally, these devices are safe to use. However, it is possible that the skin under the electrodes will become irritated. How to prepare for monitoring Your health care provider will prepare your chest for the electrode placement and show you how to use the monitor.  Do not apply lotions to your chest before monitoring.  Follow directions on how to care for the monitor, and how to return the monitor when the testing period is complete. How to use your cardiac monitor  Follow directions about how long to wear the monitor, and if you can take the monitor off in order to shower or bathe. ? Do not let the monitor get wet. ? Do not bathe, swim, or use a hot tub while wearing the monitor.  Keep your skin clean. Do not put body lotion or moisturizer on your chest.  Change the electrodes as told by your health care provider, or any time they stop sticking to your skin. You may need to use medical tape to keep them on.  Try to put the electrodes in slightly different places on your chest to help prevent skin irritation. Follow directions from your health care provider about where to place the electrodes.  Make sure the monitor is safely clipped to your clothing or in a location close to your body as recommended by your health care provider.  If your monitor has a symptoms button, press the button to mark an event as soon as you feel a heart-related symptom, such as: ? Dizziness. ? Weakness. ? Light-headedness. ? Palpitations. ? Thumping or pounding in your chest. ? Shortness of breath. ? Unexplained weakness.  Keep a diary of your activities, such as walking, doing chores, and taking  medicine. It is very important to note what you were doing when you pushed the button to record your symptoms. This will help your health care provider determine what might be contributing to your symptoms.  Send the recorded information as recommended by your health care provider. It may take some time for your health care provider to process the results.  Change the batteries as told by your health care provider.  Keep electronic devices away from your monitor. These include: ? Tablets. ? MP3 players. ? Cell phones.  While wearing your monitor you should avoid: ? Electric blankets. ? Armed forces operational officer. ? Electric toothbrushes. ? Microwave ovens. ? Magnets. ? Metal detectors. Get help right away if:  You have chest pain.  You have shortness of breath or extreme difficulty breathing.  You develop a very fast heartbeat that does not get better.  You develop dizziness that does not go away.  You faint or constantly feel like you are about to faint. Summary  An ambulatory cardiac monitor is a small recording device that is used to detect abnormal heart rhythms (arrhythmias).  Make sure you understand how to send the information from the monitor to your health care provider.  It is important to press the button on the monitor when you have any heart-related symptoms.  Keep a diary of your activities, such as walking, doing chores, and taking medicine. It is very important to note what you were doing when you pushed the button to record your symptoms. This will help your health care provider learn what might be causing your symptoms. This information is not intended to replace advice given to you by your health care provider. Make sure you discuss any questions you have with your health care provider. Document Released: 09/01/2008 Document Revised: 11/05/2017 Document Reviewed: 11/07/2016 Elsevier Patient Education  [place].  Echocardiogram An echocardiogram is a  procedure that uses painless sound waves (ultrasound) to produce an image of the heart. Images from an echocardiogram can provide important information about:  Signs of coronary artery disease (CAD).  Aneurysm detection. An aneurysm is a weak or damaged part of an artery wall that bulges out from the normal force of blood pumping through the body.  Heart size and shape. Changes in the size or shape of the heart can be associated with certain conditions, including heart failure, aneurysm, and CAD.  Heart muscle function.  Heart valve function.  Signs of a past heart attack.  Fluid buildup around the heart.  Thickening of the heart muscle.  A tumor or infectious growth around the heart valves. Tell a health care provider about:  Any allergies you have.  All medicines you are taking, including vitamins, herbs, eye drops, creams, and over-the-counter medicines.  Any blood disorders you have.  Any surgeries you have had.  Any medical conditions you have.  Whether you are pregnant or may be pregnant. What are the risks? Generally, this is a safe procedure. However, problems may occur, including:  Allergic reaction to dye (contrast) that may be used during the procedure. What happens before the procedure? No specific preparation is needed. You may eat and drink normally. What happens during the procedure?   An IV tube may be inserted into one of your veins.  You may receive contrast through this tube. A contrast is an injection that improves the quality of the pictures from your heart.  A gel will  be applied to your chest.  A wand-like tool (transducer) will be moved over your chest. The gel will help to transmit the sound waves from the transducer.  The sound waves will harmlessly bounce off of your heart to allow the heart images to be captured in real-time motion. The images will be recorded on a computer. The procedure may vary among health care providers and hospitals.  What happens after the procedure?  You may return to your normal, everyday life, including diet, activities, and medicines, unless your health care provider tells you not to do that. Summary  An echocardiogram is a procedure that uses painless sound waves (ultrasound) to produce an image of the heart.  Images from an echocardiogram can provide important information about the size and shape of your heart, heart muscle function, heart valve function, and fluid buildup around your heart.  You do not need to do anything to prepare before this procedure. You may eat and drink normally.  After the echocardiogram is completed, you may return to your normal, everyday life, unless your health care provider tells you not to do that. This information is not intended to replace advice given to you by your health care provider. Make sure you discuss any questions you have with your health care provider. Document Released: 11/20/2000 Document Revised: 03/16/2019 Document Reviewed: 12/26/2016 Elsevier Patient Education  2020 [employer].      Adopting a Healthy Lifestyle.  Know what a healthy weight is for you (roughly BMI <25) and aim to maintain this   Aim for 7+ servings of fruits and vegetables daily   65-80+ fluid ounces of water or unsweet tea for healthy kidneys   Limit to max 1 drink of alcohol per day; avoid smoking/tobacco   Limit animal fats in diet for cholesterol and heart health - choose grass fed whenever available   Avoid highly processed foods, and foods high in saturated/trans fats   Aim for low stress - take time to unwind and care for your mental health   Aim for 150 min of moderate intensity exercise weekly for heart health, and weights twice weekly for bone health   Aim for 7-9 hours of sleep daily   When it comes to diets, agreement about the perfect plan isnt easy to find, even among the experts. Experts at the [employer] developed an idea  known as the Healthy Eating Plate. Just imagine a plate divided into logical, healthy portions.   The emphasis is on diet quality:   Load up on vegetables and fruits - one-half of your plate: Aim for color and variety, and remember that potatoes dont count.   Go for whole grains - one-quarter of your plate: Whole wheat, barley, wheat berries, quinoa, oats, brown rice, and foods made with them. If you want pasta, go with whole wheat pasta.   Protein power - one-quarter of your plate: Fish, chicken, beans, and nuts are all healthy, versatile protein sources. Limit red meat.   The diet, however, does go beyond the plate, offering a few other suggestions.   Use healthy plant oils, such as olive, canola, soy, corn, sunflower and peanut. Check the labels, and avoid partially hydrogenated oil, which have unhealthy trans fats.   If youre thirsty, drink water. Coffee and tea are good in moderation, but skip sugary drinks and limit milk and dairy products to one or two daily servings.   The type of carbohydrate in the diet is more important than the  amount. Some sources of carbohydrates, such as vegetables, fruits, whole grains, and beans-are healthier than others.   Finally, stay active  Signed, Berniece Salines, DO  10/18/2019 2:05 PM    [facility]

## 2019-10-18 NOTE — Addendum Note (Signed)
Addended by: Austin Miles on: 10/18/2019 02:50 PM   Modules accepted: Orders

## 2019-10-18 NOTE — Patient Instructions (Addendum)
Medication Instructions:  Your physician has recommended you make the following change in your medication:  START COREG 3.125 MG TWICE DAILY  *If you need a refill on your cardiac medications before your next appointment, please call your pharmacy*  Lab Work: Your physician recommends that you have labs drawn today: BMET Magnesium CBC  If you have labs (blood work) drawn today and your tests are completely normal, you will receive your results only by: Marland Kitchen MyChart Message (if you have MyChart) OR . A paper copy in the mail If you have any lab test that is abnormal or we need to change your treatment, we will call you to review the results.  Testing/Procedures: Your physician has requested that you have an echocardiogram. Echocardiography is a painless test that uses sound waves to create images of your heart. It provides your doctor with information about the size and shape of your heart and how well your heart's chambers and valves are working. This procedure takes approximately one hour. There are no restrictions for this procedure.  Your physician has recommended that you wear a Zio Monitor for 3 days. These monitors are medical devices that record the heart's electrical activity. Doctors most often use these monitors to diagnose arrhythmias. Arrhythmias are problems with the speed or rhythm of the heartbeat. The monitor is a small, portable device. You can wear one while you do your normal daily activities. This is usually used to diagnose what is causing palpitations/syncope (passing out).   Follow-Up: At [facility], you and your health needs are our priority.  As part of our continuing mission to provide you with exceptional heart care, we have created designated Provider Care Teams.  These Care Teams include your primary Cardiologist (physician) and Advanced Practice Providers (APPs -  Physician Assistants and Nurse Practitioners) who all work together to provide you with the care  you need, when you need it.  Your next appointment:   1 month  The format for your next appointment:   In Person  Provider:   You may see Dr. Harriet Masson or the following Advanced Practice Provider on your designated Care Team:    Laurann Montana, FNP   Other Instructions   Ambulatory Cardiac Monitoring An ambulatory cardiac monitor is a small recording device that is used to detect abnormal heart rhythms (arrhythmias). Most monitors are connected by wires to flat, sticky disks (electrodes) that are then attached to your chest. You may need to wear a monitor if you have had symptoms such as:  Fast heartbeats (palpitations).  Dizziness.  Fainting or light-headedness.  Unexplained weakness.  Shortness of breath. There are several types of monitors. Some common monitors include:  Holter monitor. This records your heart rhythm continuously, usually for 24-48 hours.  Event (episodic) monitor. This monitor has a symptoms button, and when pushed, it will begin recording. You need to activate this monitor to record when you have a heart-related symptom.  Automatic detection monitor. This monitor will begin recording when it detects an abnormal heartbeat. What are the risks? Generally, these devices are safe to use. However, it is possible that the skin under the electrodes will become irritated. How to prepare for monitoring Your health care provider will prepare your chest for the electrode placement and show you how to use the monitor.  Do not apply lotions to your chest before monitoring.  Follow directions on how to care for the monitor, and how to return the monitor when the testing period is complete. How to  use your cardiac monitor  Follow directions about how long to wear the monitor, and if you can take the monitor off in order to shower or bathe. ? Do not let the monitor get wet. ? Do not bathe, swim, or use a hot tub while wearing the monitor.  Keep your skin clean. Do  not put body lotion or moisturizer on your chest.  Change the electrodes as told by your health care provider, or any time they stop sticking to your skin. You may need to use medical tape to keep them on.  Try to put the electrodes in slightly different places on your chest to help prevent skin irritation. Follow directions from your health care provider about where to place the electrodes.  Make sure the monitor is safely clipped to your clothing or in a location close to your body as recommended by your health care provider.  If your monitor has a symptoms button, press the button to mark an event as soon as you feel a heart-related symptom, such as: ? Dizziness. ? Weakness. ? Light-headedness. ? Palpitations. ? Thumping or pounding in your chest. ? Shortness of breath. ? Unexplained weakness.  Keep a diary of your activities, such as walking, doing chores, and taking medicine. It is very important to note what you were doing when you pushed the button to record your symptoms. This will help your health care provider determine what might be contributing to your symptoms.  Send the recorded information as recommended by your health care provider. It may take some time for your health care provider to process the results.  Change the batteries as told by your health care provider.  Keep electronic devices away from your monitor. These include: ? Tablets. ? MP3 players. ? Cell phones.  While wearing your monitor you should avoid: ? Electric blankets. ? Armed forces operational officer. ? Electric toothbrushes. ? Microwave ovens. ? Magnets. ? Metal detectors. Get help right away if:  You have chest pain.  You have shortness of breath or extreme difficulty breathing.  You develop a very fast heartbeat that does not get better.  You develop dizziness that does not go away.  You faint or constantly feel like you are about to faint. Summary  An ambulatory cardiac monitor is a small  recording device that is used to detect abnormal heart rhythms (arrhythmias).  Make sure you understand how to send the information from the monitor to your health care provider.  It is important to press the button on the monitor when you have any heart-related symptoms.  Keep a diary of your activities, such as walking, doing chores, and taking medicine. It is very important to note what you were doing when you pushed the button to record your symptoms. This will help your health care provider learn what might be causing your symptoms. This information is not intended to replace advice given to you by your health care provider. Make sure you discuss any questions you have with your health care provider. Document Released: 09/01/2008 Document Revised: 11/05/2017 Document Reviewed: 11/07/2016 Elsevier Patient Education  [place].  Echocardiogram An echocardiogram is a procedure that uses painless sound waves (ultrasound) to produce an image of the heart. Images from an echocardiogram can provide important information about:  Signs of coronary artery disease (CAD).  Aneurysm detection. An aneurysm is a weak or damaged part of an artery wall that bulges out from the normal force of blood pumping through the body.  Heart size and  shape. Changes in the size or shape of the heart can be associated with certain conditions, including heart failure, aneurysm, and CAD.  Heart muscle function.  Heart valve function.  Signs of a past heart attack.  Fluid buildup around the heart.  Thickening of the heart muscle.  A tumor or infectious growth around the heart valves. Tell a health care provider about:  Any allergies you have.  All medicines you are taking, including vitamins, herbs, eye drops, creams, and over-the-counter medicines.  Any blood disorders you have.  Any surgeries you have had.  Any medical conditions you have.  Whether you are pregnant or may be pregnant. What  are the risks? Generally, this is a safe procedure. However, problems may occur, including:  Allergic reaction to dye (contrast) that may be used during the procedure. What happens before the procedure? No specific preparation is needed. You may eat and drink normally. What happens during the procedure?   An IV tube may be inserted into one of your veins.  You may receive contrast through this tube. A contrast is an injection that improves the quality of the pictures from your heart.  A gel will be applied to your chest.  A wand-like tool (transducer) will be moved over your chest. The gel will help to transmit the sound waves from the transducer.  The sound waves will harmlessly bounce off of your heart to allow the heart images to be captured in real-time motion. The images will be recorded on a computer. The procedure may vary among health care providers and hospitals. What happens after the procedure?  You may return to your normal, everyday life, including diet, activities, and medicines, unless your health care provider tells you not to do that. Summary  An echocardiogram is a procedure that uses painless sound waves (ultrasound) to produce an image of the heart.  Images from an echocardiogram can provide important information about the size and shape of your heart, heart muscle function, heart valve function, and fluid buildup around your heart.  You do not need to do anything to prepare before this procedure. You may eat and drink normally.  After the echocardiogram is completed, you may return to your normal, everyday life, unless your health care provider tells you not to do that. This information is not intended to replace advice given to you by your health care provider. Make sure you discuss any questions you have with your health care provider. Document Released: 11/20/2000 Document Revised: 03/16/2019 Document Reviewed: 12/26/2016 Elsevier Patient Education  2020  [employer].

## 2019-10-19 LAB — BASIC METABOLIC PANEL
BUN/Creatinine Ratio: 12 (ref 10–24)
BUN: 18 mg/dL (ref 8–27)
CO2: 22 mmol/L (ref 20–29)
Calcium: 9.2 mg/dL (ref 8.6–10.2)
Chloride: 104 mmol/L (ref 96–106)
Creatinine, Ser: 1.52 mg/dL — ABNORMAL HIGH (ref 0.76–1.27)
GFR calc Af Amer: 47 mL/min/{1.73_m2} — ABNORMAL LOW (ref >59)
GFR calc non Af Amer: 40 mL/min/{1.73_m2} — ABNORMAL LOW (ref >59)
Glucose: 108 mg/dL — ABNORMAL HIGH (ref 65–99)
Potassium: 4.8 mmol/L (ref 3.5–5.2)
Sodium: 142 mmol/L (ref 134–144)

## 2019-10-19 LAB — CBC
Hematocrit: 42.5 % (ref 37.5–51.0)
Hemoglobin: 13.8 g/dL (ref 13.0–17.7)
MCH: 31.3 pg (ref 26.6–33.0)
MCHC: 32.5 g/dL (ref 31.5–35.7)
MCV: 96 fL (ref 79–97)
Platelets: 318 10*3/uL (ref 150–450)
RBC: 4.41 x10E6/uL (ref 4.14–5.80)
RDW: 12.5 % (ref 11.6–15.4)
WBC: 7.4 10*3/uL (ref 3.4–10.8)

## 2019-10-19 LAB — MAGNESIUM: Magnesium: 2.2 mg/dL (ref 1.6–2.3)

## 2019-10-20 ENCOUNTER — Telehealth: Payer: Self-pay

## 2019-10-20 NOTE — Telephone Encounter (Signed)
Phoned patient, informed to stop amlodipine. He states he isn't taking it and verbalizes understanding.

## 2019-10-21 DIAGNOSIS — I493 Ventricular premature depolarization: Secondary | ICD-10-CM | POA: Diagnosis not present

## 2019-10-21 DIAGNOSIS — Z8673 Personal history of transient ischemic attack (TIA), and cerebral infarction without residual deficits: Secondary | ICD-10-CM | POA: Diagnosis not present

## 2019-10-26 ENCOUNTER — Other Ambulatory Visit: Payer: Self-pay

## 2019-10-26 ENCOUNTER — Ambulatory Visit (HOSPITAL_BASED_OUTPATIENT_CLINIC_OR_DEPARTMENT_OTHER)
Admission: RE | Admit: 2019-10-26 | Discharge: 2019-10-26 | Disposition: A | Discharge status: Home / Self Care | Payer: Medicare Other | Source: Ambulatory Visit | Attending: Cardiology | Admitting: Cardiology

## 2019-10-26 DIAGNOSIS — Z8673 Personal history of transient ischemic attack (TIA), and cerebral infarction without residual deficits: Secondary | ICD-10-CM | POA: Diagnosis not present

## 2019-10-26 DIAGNOSIS — I708 Atherosclerosis of other arteries: Secondary | ICD-10-CM | POA: Insufficient documentation

## 2019-10-26 DIAGNOSIS — I493 Ventricular premature depolarization: Secondary | ICD-10-CM | POA: Insufficient documentation

## 2019-10-26 DIAGNOSIS — E782 Mixed hyperlipidemia: Secondary | ICD-10-CM | POA: Diagnosis not present

## 2019-10-26 DIAGNOSIS — I700 Atherosclerosis of aorta: Secondary | ICD-10-CM | POA: Diagnosis not present

## 2019-10-26 NOTE — Progress Notes (Signed)
  Echocardiogram 2D Echocardiogram has been performed.  Benjamin Bryan 10/26/2019, 9:38 AM

## 2019-10-30 DIAGNOSIS — I493 Ventricular premature depolarization: Secondary | ICD-10-CM | POA: Diagnosis not present

## 2019-11-16 ENCOUNTER — Ambulatory Visit (INDEPENDENT_AMBULATORY_CARE_PROVIDER_SITE_OTHER): Payer: Medicare Other | Admitting: Cardiology

## 2019-11-16 ENCOUNTER — Other Ambulatory Visit: Payer: Self-pay

## 2019-11-16 ENCOUNTER — Encounter: Payer: Self-pay | Admitting: Cardiology

## 2019-11-16 VITALS — BP 140/78 | HR 86 | Ht 65.0 in | Wt 147.0 lb

## 2019-11-16 DIAGNOSIS — I10 Essential (primary) hypertension: Secondary | ICD-10-CM

## 2019-11-16 DIAGNOSIS — I493 Ventricular premature depolarization: Secondary | ICD-10-CM | POA: Diagnosis not present

## 2019-11-16 DIAGNOSIS — I471 Supraventricular tachycardia: Secondary | ICD-10-CM | POA: Diagnosis not present

## 2019-11-16 MED ORDER — ATORVASTATIN CALCIUM 10 MG PO TABS: 10.0000 mg | ORAL_TABLET | Freq: Every day | ORAL | 1 refills | Status: DC

## 2019-11-16 MED ORDER — CARVEDILOL 6.25 MG PO TABS: 6.2500 mg | ORAL_TABLET | Freq: Two times a day (BID) | ORAL | 3 refills | Status: DC

## 2019-11-16 NOTE — Patient Instructions (Signed)
Medication Instructions:  Your physician has recommended you make the following change in your medication:   STOP:Amlodipine   INCREASE: Coreg(cavedilol) to 6.25 mg Twice daily( may take 2 tabs 3.125 mg Twice daily until you run out)  RESTART: Lipitor (atovastatin) 10 mg Take 1 tab daily  *If you need a refill on your cardiac medications before your next appointment, please call your pharmacy*  Lab Work: None If you have labs (blood work) drawn today and your tests are completely normal, you will receive your results only by: Marland Kitchen MyChart Message (if you have MyChart) OR . A paper copy in the mail If you have any lab test that is abnormal or we need to change your treatment, we will call you to review the results.  Testing/Procedures: nOne  Follow-Up: At [facility], you and your health needs are our priority.  As part of our continuing mission to provide you with exceptional heart care, we have created designated Provider Care Teams.  These Care Teams include your primary Cardiologist (physician) and Advanced Practice Providers (APPs -  Physician Assistants and Nurse Practitioners) who all work together to provide you with the care you need, when you need it.  Your next appointment:   3 month(s)  The format for your next appointment:   In Person  Provider:   Berniece Salines, DO  Other Instructions

## 2019-11-16 NOTE — Progress Notes (Signed)
Cardiology Office Note:    Date:  11/16/2019   ID:  Benjamin Bryan, DOB 08-Dec-1927, MRN [id number]  PCP:  Benjamin Hams, MD  Cardiologist:  Benjamin Salines, DO  Electrophysiologist:  None   Referring MD: Benjamin Hams, MD   Chief Complaint  Patient presents with  . Follow-up    History of Present Illness:    Benjamin Bryan is a 85 y.o. male with a hx of hypertension, hyperlipidemia, peripheral artery disease, history of TIA history of chronic kidney disease stage III, and history of frequent PVCs . I saw the patient on 10/18/2019 and at that time he complained of dizziness. A zio monitor was placed on patient to evaluate this. In addition, I added coreg due to frequent pvcs.  He did wear his monitor. Today he offers no complaints  Past Medical History:  Diagnosis Date  . Atherosclerosis of aortic bifurcation and common iliac arteries ([place]) 01/26/2018   Seen on xray 01/26/18. Normal ABI study 2018  . COPD (chronic obstructive pulmonary disease) ([place]) 10/03/2015  . Frequent PVCs 09/11/2015  . History of prostate cancer 10/03/2015   Radical prostectomy    . History of pulmonary embolus (PE) 10/07/2018   Late October 2019 small clot burden  . History of TIA (transient ischemic attack) 09/11/2015  . Hyperlipidemia   . Hypertension   . Hypertensive kidney disease with stage 3 chronic kidney disease 09/11/2015  . Hypertensive urgency 08/30/2019  . Hypothyroidism 09/11/2015  . Iron deficiency anemia 12/09/2018  . Neuralgia, neuritis, and radiculitis, unspecified 03/20/2014  . Neurologic orthostatic hypotension ([place]) 09/12/2014  . Shingles   . Thyroid disease     Past Surgical History:  Procedure Laterality Date  . radical prostectomy  1994  . removed section of intestine  08/24/2005   polyp    Current Medications: Current Meds  Medication Sig  . famotidine (PEPCID) 20 MG tablet Take 20 mg by mouth daily.  . Glucosamine-Chondroitin 1500-1200 MG/30ML LIQD Take by mouth. Take two  tablets per day  . ipratropium (ATROVENT) 0.06 % nasal spray Place 2 sprays into both nostrils every 4 (four) hours as needed.  Marland Kitchen levothyroxine (SYNTHROID) 75 MCG tablet Take 1 tablet (75 mcg total) by mouth daily.  . [DISCONTINUED] carvedilol (COREG) 3.125 MG tablet Take 1 tablet (3.125 mg total) by mouth 2 (two) times daily.     Allergies:   Patient has no known allergies.   Social History   Socioeconomic History  . Marital status: Married    Spouse name: Benjamin Bryan  . Number of children: 1  . Years of education: 78  . Highest education level: 12th grade  Occupational History  . Occupation: retired    Comment: Mudlogger  Tobacco Use  . Smoking status: Former Smoker    Quit date: 12/11/1984    Years since quitting: 34.9  . Smokeless tobacco: Never Used  Substance and Sexual Activity  . Alcohol use: Not Currently  . Drug use: No  . Sexual activity: Never    Partners: Female  Other Topics Concern  . Not on file  Social History Narrative   Drinks caffeine during the day- coffee. Will go walking in neighborhood when the weather is decent.   Social Determinants of Health   Financial Resource Strain: Low Risk   . Difficulty of Paying Living Expenses: Not hard at all  Food Insecurity: No Food Insecurity  . Worried About Charity fundraiser in the Last Year: Never true  . Ran Out  of Food in the Last Year: Never true  Transportation Needs: No Transportation Needs  . Lack of Transportation (Medical): No  . Lack of Transportation (Non-Medical): No  Physical Activity: Inactive  . Days of Exercise per Week: 0 days  . Minutes of Exercise per Session: 0 min  Stress: No Stress Concern Present  . Feeling of Stress : Not at all  Social Connections: Moderately Isolated  . Frequency of Communication with Friends and Family: Once a week  . Frequency of Social Gatherings with Friends and Family: Once a week  . Attends Religious Services: Never  . Active Member of Clubs or Organizations: No   . Attends Archivist Meetings: Never  . Marital Status: Married     Family History: The patient's family history includes Prostate cancer in his father; Stroke in his father.  ROS:   Review of Systems  Constitution: Negative for decreased appetite, fever and weight gain.  HENT: Negative for congestion, ear discharge, hoarse voice and sore throat.   Eyes: Negative for discharge, redness, vision loss in right eye and visual halos.  Cardiovascular: Negative for chest pain, dyspnea on exertion, leg swelling, orthopnea and palpitations.  Respiratory: Negative for cough, hemoptysis, shortness of breath and snoring.   Endocrine: Negative for heat intolerance and polyphagia.  Hematologic/Lymphatic: Negative for bleeding problem. Does not bruise/bleed easily.  Skin: Negative for flushing, nail changes, rash and suspicious lesions.  Musculoskeletal: Negative for arthritis, joint pain, muscle cramps, myalgias, neck pain and stiffness.  Gastrointestinal: Negative for abdominal pain, bowel incontinence, diarrhea and excessive appetite.  Genitourinary: Negative for decreased libido, genital sores and incomplete emptying.  Neurological: Negative for brief paralysis, focal weakness, headaches and loss of balance.  Psychiatric/Behavioral: Negative for altered mental status, depression and suicidal ideas.  Allergic/Immunologic: Negative for HIV exposure and persistent infections.    EKGs/Labs/Other Studies Reviewed:    The following studies were reviewed today:   EKG:  None today  zio monitor The patient wore the monitor for 3 days 2 hours starting 10/18/2019 . Indication: PVC  The minimum heart rate was 52 bpm, maximum heart rate was 190 bpm, and average heart rate was 70 bpm. Predominant underlying rhythm was Sinus Rhythm. 7 Supraventricular Tachycardia runs occurred, the run with the fastest interval lasting 6 beats with a maximum rate of 190 bpm, the longest lasting 14.4 secs with  an average rate of 98 bpm. Premature atrial complexes were rare (<1.0%).  Premature Ventricular complexes were frequent (15.1%, 46505). VE Couplets were occasional (1.6%, 2466), and VE Triplets were rare (<1.0%, 39). Ventricular Bigeminy and Trigeminy were present.  No ventricular tachycardia, No pauses, No AV block and no atrial fibrillation present. 19 patient triggered events all associated with premature ventricular complexes.  Conclusion: This study is study is remarkable for Paroxysmal supraventricular tachycardia and symptomatic premature ventricular complexes.  Recent Labs: 05/11/2019: TSH 1.08 08/30/2019: ALT 11 10/18/2019: BUN 18; Creatinine, Ser 1.52; Hemoglobin 13.8; Magnesium 2.2; Platelets 318; Potassium 4.8; Sodium 142  Recent Lipid Panel    Component Value Date/Time   CHOL 105 02/10/2019 0000   TRIG 46 02/10/2019 0000   HDL 44 02/10/2019 0000   CHOLHDL 2.5 01/26/2018 1129   VLDL 18 09/16/2016 0831   LDLCALC 52 02/10/2019 0000   LDLCALC 70 01/26/2018 1129    Physical Exam:    VS:  BP 140/78 (BP Location: Left Arm, Patient Position: Sitting, Cuff Size: Normal)   Pulse 86   Ht 5\' 5"  (1.651 m)   Wt  147 lb (66.7 kg)   SpO2 98%   BMI 24.46 kg/m     Wt Readings from Last 3 Encounters:  11/16/19 147 lb (66.7 kg)  10/18/19 147 lb (66.7 kg)  10/10/19 145 lb (65.8 kg)     GEN: Well nourished, well developed in no acute distress HEENT: Normal NECK: No JVD; No carotid bruits LYMPHATICS: No lymphadenopathy CARDIAC: S1S2 noted,RRR, no murmurs, rubs, gallops RESPIRATORY:  Clear to auscultation without rales, wheezing or rhonchi  ABDOMEN: Soft, non-tender, non-distended, +bowel sounds, no guarding. EXTREMITIES: No edema, No cyanosis, no clubbing MUSCULOSKELETAL:  No edema; No deformity  SKIN: Warm and dry NEUROLOGIC:  Alert and oriented x 3, non-focal PSYCHIATRIC:  Normal affect, good insight  ASSESSMENT:    1. Symptomatic PVCs   2. Essential hypertension    3. Paroxysmal atrial tachycardia (HCC)    PLAN:    1.  He is experiencing symptomatic PVCs, and there was evidence of paroxysmal atrial tachycardia.  Therefore at this time for symptomatic control I am going to increase his carvedilol to 6.25 mg twice daily and stop the amlodipine.  He has not been taking his Lipitor due to misunderstanding as he thought that the carvedilol was replacing this medication.  I did educate the patient that the Lipitor is for high cholesterol therefore he would need to restart this medication.  Medication refill sent to the pharmacy.  The patient is in agreement with the above plan. The patient left the office in stable condition.  The patient will follow up in 3 months or sooner if needed   Medication Adjustments/Labs and Tests Ordered: Current medicines are reviewed at length with the patient today.  Concerns regarding medicines are outlined above.  No orders of the defined types were placed in this encounter.  Meds ordered this encounter  Medications  . carvedilol (COREG) 6.25 MG tablet    Sig: Take 1 tablet (6.25 mg total) by mouth 2 (two) times daily.    Dispense:  180 tablet    Refill:  3  . atorvastatin (LIPITOR) 10 MG tablet    Sig: Take 1 tablet (10 mg total) by mouth daily.    Dispense:  90 tablet    Refill:  1    Patient Instructions  Medication Instructions:  Your physician has recommended you make the following change in your medication:   STOP:Amlodipine   INCREASE: Coreg(cavedilol) to 6.25 mg Twice daily( may take 2 tabs 3.125 mg Twice daily until you run out)  RESTART: Lipitor (atovastatin) 10 mg Take 1 tab daily  *If you need a refill on your cardiac medications before your next appointment, please call your pharmacy*  Lab Work: None If you have labs (blood work) drawn today and your tests are completely normal, you will receive your results only by: Marland Kitchen MyChart Message (if you have MyChart) OR . A paper copy in the mail If  you have any lab test that is abnormal or we need to change your treatment, we will call you to review the results.  Testing/Procedures: nOne  Follow-Up: At [facility], you and your health needs are our priority.  As part of our continuing mission to provide you with exceptional heart care, we have created designated Provider Care Teams.  These Care Teams include your primary Cardiologist (physician) and Advanced Practice Providers (APPs -  Physician Assistants and Nurse Practitioners) who all work together to provide you with the care you need, when you need it.  Your next appointment:  3 month(s)  The format for your next appointment:   In Person  Provider:   Berniece Salines, DO  Other Instructions      Adopting a Healthy Lifestyle.  Know what a healthy weight is for you (roughly BMI <25) and aim to maintain this   Aim for 7+ servings of fruits and vegetables daily   65-80+ fluid ounces of water or unsweet tea for healthy kidneys   Limit to max 1 drink of alcohol per day; avoid smoking/tobacco   Limit animal fats in diet for cholesterol and heart health - choose grass fed whenever available   Avoid highly processed foods, and foods high in saturated/trans fats   Aim for low stress - take time to unwind and care for your mental health   Aim for 150 min of moderate intensity exercise weekly for heart health, and weights twice weekly for bone health   Aim for 7-9 hours of sleep daily   When it comes to diets, agreement about the perfect plan isnt easy to find, even among the experts. Experts at the [place] developed an idea known as the Healthy Eating Plate. Just imagine a plate divided into logical, healthy portions.   The emphasis is on diet quality:   Load up on vegetables and fruits - one-half of your plate: Aim for color and variety, and remember that potatoes dont count.   Go for whole grains - one-quarter of your plate: Whole wheat,  barley, wheat berries, quinoa, oats, brown rice, and foods made with them. If you want pasta, go with whole wheat pasta.   Protein power - one-quarter of your plate: Fish, chicken, beans, and nuts are all healthy, versatile protein sources. Limit red meat.   The diet, however, does go beyond the plate, offering a few other suggestions.   Use healthy plant oils, such as olive, canola, soy, corn, sunflower and peanut. Check the labels, and avoid partially hydrogenated oil, which have unhealthy trans fats.   If youre thirsty, drink water. Coffee and tea are good in moderation, but skip sugary drinks and limit milk and dairy products to one or two daily servings.   The type of carbohydrate in the diet is more important than the amount. Some sources of carbohydrates, such as vegetables, fruits, whole grains, and beans-are healthier than others.   Finally, stay active  Signed, Benjamin Salines, DO  11/16/2019 10:40 AM    [place]

## 2019-11-27 NOTE — Progress Notes (Signed)
Subjective:   Benjamin Bryan is a 85 y.o. male who presents for Medicare Annual/Subsequent preventive examination.  Review of Systems:  No ROS.  Medicare Wellness Virtual Visit.  Visual/audio telehealth visit, UTA vital signs.   See social history for additional risk factors.    Cardiac Risk Factors include: advanced age (>13men, >24 women);male gender;sedentary lifestyle  Sleep patterns:  Getting 9-10 hours of sleep a night. Wakes up 2 times to void during the night. Wakes up and feels rested and ready for the day.  Home Safety/Smoke Alarms: Feels safe in home. Smoke alarms in place.  Living environment;Lives with wife in 1 story home no stairs in or around the home. Shower is a step over tub combo and no grab bars in place. Seat Belt Safety/Bike Helmet: Wears seat belt.  Home BP readings :  11/27/19-112/570P=83 11/28/19-0132/62 P= 85 11/29/19- 150/64 P=77 11/30/19-113/57 P=87 12/01/19 -105/47 P=80 12/02/19- 162/65 P=78 12/03/19-107/54 P=92 12/04/19- 162/81 P=85 Male:   CCS- Aged out    PSA-  Aged out Lab Results  Component Value Date   PSA <0.1 01/26/2018        Objective:    Vitals: BP (!) 160/80   Pulse 69   Temp 98 F (36.7 C) (Oral)   Wt 150 lb (68 kg)   SpO2 99%   BMI 24.96 kg/m   Body mass index is 24.96 kg/m.  Advanced Directives 12/04/2019 11/28/2018 09/11/2015  Does Patient Have a Medical Advance Directive? No No No  Does patient want to make changes to medical advance directive? No - Patient declined - -  Would patient like information on creating a medical advance directive? No - Patient declined No - Patient declined No - patient declined information    Tobacco Social History   Tobacco Use  Smoking Status Former Smoker  . Quit date: 12/11/1984  . Years since quitting: 35.0  Smokeless Tobacco Never Used     Counseling given: No   Clinical Intake:  Pre-visit preparation completed: Yes  Pain : No/denies pain     Nutritional Risks:  None Diabetes: No  How often do you need to have someone help you when you read instructions, pamphlets, or other written materials from your doctor or pharmacy?: 1 - Never What is the last grade level you completed in school?: 12  Interpreter Needed?: No  Information entered by :: Orlie Dakin, LPN  Past Medical History:  Diagnosis Date  . Atherosclerosis of aortic bifurcation and common iliac arteries ([place]) 01/26/2018   Seen on xray 01/26/18. Normal ABI study 2018  . COPD (chronic obstructive pulmonary disease) ([place]) 10/03/2015  . Frequent PVCs 09/11/2015  . History of prostate cancer 10/03/2015   Radical prostectomy    . History of pulmonary embolus (PE) 10/07/2018   Late October 2019 small clot burden  . History of TIA (transient ischemic attack) 09/11/2015  . Hyperlipidemia   . Hypertension   . Hypertensive kidney disease with stage 3 chronic kidney disease 09/11/2015  . Hypertensive urgency 08/30/2019  . Hypothyroidism 09/11/2015  . Iron deficiency anemia 12/09/2018  . Neuralgia, neuritis, and radiculitis, unspecified 03/20/2014  . Neurologic orthostatic hypotension ([place]) 09/12/2014  . Shingles   . Thyroid disease    Past Surgical History:  Procedure Laterality Date  . radical prostectomy  1994  . removed section of intestine  08/24/2005   polyp   Family History  Problem Relation Age of Onset  . Prostate cancer Father   . Stroke Father  Social History   Socioeconomic History  . Marital status: Married    Spouse name: Romie Minus  . Number of children: 1  . Years of education: 43  . Highest education level: 12th grade  Occupational History  . Occupation: retired    Comment: Mudlogger  Tobacco Use  . Smoking status: Former Smoker    Quit date: 12/11/1984    Years since quitting: 35.0  . Smokeless tobacco: Never Used  Substance and Sexual Activity  . Alcohol use: Not Currently  . Drug use: No  . Sexual activity: Not Currently    Partners: Female  Other Topics Concern   . Not on file  Social History Narrative   Drinks caffeine during the day- coffee. Will go walking in neighborhood when the weather is decent.   Social Determinants of Health   Financial Resource Strain:   . Difficulty of Paying Living Expenses: Not on file  Food Insecurity:   . Worried About Charity fundraiser in the Last Year: Not on file  . Ran Out of Food in the Last Year: Not on file  Transportation Needs:   . Lack of Transportation (Medical): Not on file  . Lack of Transportation (Non-Medical): Not on file  Physical Activity:   . Days of Exercise per Week: Not on file  . Minutes of Exercise per Session: Not on file  Stress:   . Feeling of Stress : Not on file  Social Connections:   . Frequency of Communication with Friends and Family: Not on file  . Frequency of Social Gatherings with Friends and Family: Not on file  . Attends Religious Services: Not on file  . Active Member of Clubs or Organizations: Not on file  . Attends Archivist Meetings: Not on file  . Marital Status: Not on file    Outpatient Encounter Medications as of 12/04/2019  Medication Sig  . atorvastatin (LIPITOR) 10 MG tablet Take 1 tablet (10 mg total) by mouth daily.  . carvedilol (COREG) 6.25 MG tablet Take 1 tablet (6.25 mg total) by mouth 2 (two) times daily.  . famotidine (PEPCID) 20 MG tablet Take 20 mg by mouth daily.  . Glucosamine-Chondroitin 1500-1200 MG/30ML LIQD Take by mouth. Take two tablets per day  . ipratropium (ATROVENT) 0.06 % nasal spray Place 2 sprays into both nostrils every 4 (four) hours as needed.  Marland Kitchen levothyroxine (SYNTHROID) 75 MCG tablet Take 1 tablet (75 mcg total) by mouth daily.   No facility-administered encounter medications on file as of 12/04/2019.    Activities of Daily Living In your present state of health, do you have any difficulty performing the following activities: 12/04/2019  Hearing? Y  Comment hard of hearing bilaterally- right worse than left  ear  Vision? N  Difficulty concentrating or making decisions? Y  Comment remembering short term  Walking or climbing stairs? N  Dressing or bathing? N  Doing errands, shopping? N  Preparing Food and eating ? N  Using the Toilet? N  In the past six months, have you accidently leaked urine? Y  Comment leaks urine for sometime now- not a new issue  Do you have problems with loss of bowel control? N  Managing your Medications? N  Managing your Finances? N  Housekeeping or managing your Housekeeping? N  Some recent data might be hidden    Patient Care Team: Gregor Hams, MD as PCP - General (Family Medicine) Berniece Salines, DO as PCP - Cardiology (Cardiology)   Assessment:  This is a routine wellness examination for Max.Physical assessment deferred to PCP.   Exercise Activities and Dietary recommendations Current Exercise Habits: Home exercise routine, Type of exercise: walking, Time (Minutes): 15, Frequency (Times/Week): 5, Weekly Exercise (Minutes/Week): 75, Intensity: Mild Diet  Eats a healthy diet of vegetables, fruits and proteins. Drinks water daily. 1 soda for lunch Breakfast:sausage biscuit, cornflakes, raisin toast Lunch: snacks Dinner:  Meat and vegetables, soup and sandwich     Goals    . Exercise 3x per week (30 min per time)     Increase exercise during the week.    . Patient Stated     Patient stated wants to get back into more exercising and improve his dizziness.       Fall Risk Fall Risk  12/04/2019 04/07/2019 11/28/2018 07/26/2018  Falls in the past year? 0 0 0 No  Number falls in past yr: 0 0 - -  Injury with Fall? 0 0 - -  Follow up Falls prevention discussed Falls evaluation completed - -   Is the patient's home free of loose throw rugs in walkways, pet beds, electrical cords, etc?   yes      Grab bars in the bathroom? no      Handrails on the stairs?   no      Adequate lighting?   yes   Depression Screen PHQ 2/9 Scores 12/04/2019 11/28/2018  07/26/2018 03/22/2017  PHQ - 2 Score 0 0 0 0    Cognitive Function     6CIT Screen 12/04/2019 11/28/2018  What Year? 0 points 0 points  What month? 0 points 0 points  What time? 0 points 0 points  Count back from 20 0 points 0 points  Months in reverse 0 points 0 points  Repeat phrase 2 points 2 points  Total Score 2 2    Immunization History  Administered Date(s) Administered  . Influenza, High Dose Seasonal PF 09/14/2017, 08/29/2018, 08/08/2019  . Influenza-Unspecified 08/27/2015, 09/06/2016  . Pneumococcal Conjugate-13 09/16/2016  . Pneumococcal Polysaccharide-23 01/26/2018  . Tdap 01/26/2018    Screening Tests Health Maintenance  Topic Date Due  . TETANUS/TDAP  01/27/2028  . INFLUENZA VACCINE  Completed  . PNA vac Low Risk Adult  Completed       Plan:  Please schedule your next medicare wellness visit with me in 1 yr.  Mr. Lagow , Thank you for taking time to come for your Medicare Wellness Visit. I appreciate your ongoing commitment to your health goals. Please review the following plan we discussed and let me know if I can assist you in the future.  Continue doing brain stimulating activities (puzzles, reading, adult coloring books, staying active) to keep memory sharp.    These are the goals we discussed: Goals    . Exercise 3x per week (30 min per time)     Increase exercise during the week.    . Patient Stated     Patient stated wants to get back into more exercising and improve his dizziness.       This is a list of the screening recommended for you and due dates:  Health Maintenance  Topic Date Due  . Tetanus Vaccine  01/27/2028  . Flu Shot  Completed  . Pneumonia vaccines  Completed     I have personally reviewed and noted the following in the patient's chart:   . Medical and social history . Use of alcohol, tobacco or illicit drugs  . Current medications and supplements .  Functional ability and status . Nutritional status . Physical  activity . Advanced directives . List of other physicians . Hospitalizations, surgeries, and ER visits in previous 12 months . Vitals . Screenings to include cognitive, depression, and falls . Referrals and appointments  In addition, I have reviewed and discussed with patient certain preventive protocols, quality metrics, and best practice recommendations. A written personalized care plan for preventive services as well as general preventive health recommendations were provided to patient.     Joanne Chars, LPN  [phone]

## 2019-12-04 ENCOUNTER — Other Ambulatory Visit: Payer: Self-pay

## 2019-12-04 ENCOUNTER — Ambulatory Visit (INDEPENDENT_AMBULATORY_CARE_PROVIDER_SITE_OTHER): Payer: Medicare Other | Admitting: *Deleted

## 2019-12-04 VITALS — BP 160/80 | HR 69 | Temp 98.0°F | Ht 65.0 in | Wt 150.0 lb

## 2019-12-04 DIAGNOSIS — Z0000 Encounter for general adult medical examination without abnormal findings: Secondary | ICD-10-CM | POA: Diagnosis not present

## 2019-12-04 MED ORDER — AMBULATORY NON FORMULARY MEDICATION: 0 refills | Status: DC

## 2019-12-04 NOTE — Patient Instructions (Signed)
Please schedule your next medicare wellness visit with me in 1 yr.  Benjamin Bryan , Thank you for taking time to come for your Medicare Wellness Visit. I appreciate your ongoing commitment to your health goals. Please review the following plan we discussed and let me know if I can assist you in the future.  Continue doing brain stimulating activities (puzzles, reading, adult coloring books, staying active) to keep memory sharp.  These are the goals we discussed: Goals    . Exercise 3x per week (30 min per time)     Increase exercise during the week.    . Patient Stated     Patient stated wants to get back into more exercising and improve his dizziness.

## 2020-01-02 ENCOUNTER — Other Ambulatory Visit: Payer: Self-pay | Admitting: Family Medicine

## 2020-01-08 ENCOUNTER — Telehealth: Payer: Self-pay | Admitting: Cardiology

## 2020-01-08 NOTE — Telephone Encounter (Signed)
Called patient's wife (DPR) with Dr. Terrial Rhodes recommendations. Informed them that there are no changes to his medications right now and to follow up this week with Dr. Harriet Masson. Made patient an appointment on Thursday.

## 2020-01-08 NOTE — Telephone Encounter (Signed)
Pt c/o medication issue:  1. Name of Medication: carvedilol (COREG) 6.25 MG tablet  2. How are you currently taking this medication (dosage and times per day)? Once in morning and once at night  3. Are you having a reaction (difficulty breathing--STAT)? no  4. What is your medication issue? Dizzy when standing and walking, would like to know if the medication is too strong.

## 2020-01-08 NOTE — Telephone Encounter (Signed)
I agree with the advice that you had given him  Pam.  Please see if I can see him this week in the office

## 2020-01-08 NOTE — Telephone Encounter (Signed)
Called patient about this message. Patient stated he was wondering if he was on too much Coreg. Patient's Coreg was increase at last office visit to 6.25 mg BID (11/16/19). Patient stated he has been having issues with dizziness before this, but he thinks it has gotten a little worse. Patient mostly gets dizzy when changing positions. Encouraged patient to change positions slowly and make sure he is holding on to something when standing. Patient has been taking his BP and HR after he takes his medications. Here is the last 4 days.  01/05/20   121/61     97 01/06/20   113/66   100 01/07/20   103/48   100 01/08/20   117/52     94  Will forward to Dr. Harriet Masson for advisement.

## 2020-01-11 ENCOUNTER — Encounter: Payer: Self-pay | Admitting: Cardiology

## 2020-01-11 ENCOUNTER — Other Ambulatory Visit: Payer: Self-pay

## 2020-01-11 ENCOUNTER — Ambulatory Visit (INDEPENDENT_AMBULATORY_CARE_PROVIDER_SITE_OTHER): Payer: Medicare Other | Admitting: Cardiology

## 2020-01-11 VITALS — BP 140/74 | HR 71 | Ht 65.0 in | Wt 147.0 lb

## 2020-01-11 DIAGNOSIS — H9319 Tinnitus, unspecified ear: Secondary | ICD-10-CM | POA: Diagnosis not present

## 2020-01-11 DIAGNOSIS — I10 Essential (primary) hypertension: Secondary | ICD-10-CM

## 2020-01-11 DIAGNOSIS — I493 Ventricular premature depolarization: Secondary | ICD-10-CM | POA: Diagnosis not present

## 2020-01-11 DIAGNOSIS — R42 Dizziness and giddiness: Secondary | ICD-10-CM | POA: Diagnosis not present

## 2020-01-11 MED ORDER — CARVEDILOL 3.125 MG PO TABS: 3.1250 mg | ORAL_TABLET | Freq: Two times a day (BID) | ORAL | 1 refills | Status: DC

## 2020-01-11 NOTE — Progress Notes (Signed)
Cardiology Office Note:    Date:  01/11/2020   ID:  [name], DOB August 08, 1928, MRN [id number]  PCP:  Gregor Hams, MD  Cardiologist:  Berniece Salines, DO  Electrophysiologist:  None   Referring MD: Gregor Hams, MD   Chief Complaint  Patient presents with  . Dizziness    History of Present Illness:    Benjamin Bryan a 86 y.o.malewith a hx of hypertension, hyperlipidemia, peripheral artery disease, history of TIA history of chronic kidney disease stage III, and history of frequent PVCs. I saw the patient on 10/18/2019 and at that time he complained of dizziness. A zio monitor was placed on patient to evaluate this. In addition, I added coreg twice daily suspecting that his frequent PVCs could have been contributing to the dizziness as well as the patient was hypertensive at that visit.  The patient was able to wear the monitor I saw him on November 16, 2019 at that time I discussed the result with him which show frequent symptomatic PVCs (frequent (15.1%, [phone]) with paroxysmal atrial tachycardia.  At that visit, giving that his ZIO monitor reported symptomatic PVC I stopped his amlodipine and increase his carvedilol to 6.25 mg twice daily.  During his visit the dizziness had improved and was only limited to a few episodes a week.  Not daily.  Since the increase in his carvedilol the patient called on February 1 reporting that his dizziness has gotten worse and he was concerned that his blood pressure was also running between 114 and 120 mmhg at home.  With this we recommended the patient come to be seen and evaluated.  He tells me that he has been experiencing more dizziness in the last several weeks on a daily basis.  He denies any room spinning sensation but does note that he does have significant ringing sensation in his ears as well.  He denies any chest pain, shortness of breath.  Past Medical History:  Diagnosis Date  . Atherosclerosis of aortic bifurcation and common iliac  arteries ([place]) 01/26/2018   Seen on xray 01/26/18. Normal ABI study 2018  . COPD (chronic obstructive pulmonary disease) ([place]) 10/03/2015  . Frequent PVCs 09/11/2015  . History of prostate cancer 10/03/2015   Radical prostectomy    . History of pulmonary embolus (PE) 10/07/2018   Late October 2019 small clot burden  . History of TIA (transient ischemic attack) 09/11/2015  . Hyperlipidemia   . Hypertension   . Hypertensive kidney disease with stage 3 chronic kidney disease 09/11/2015  . Hypertensive urgency 08/30/2019  . Hypothyroidism 09/11/2015  . Iron deficiency anemia 12/09/2018  . Neuralgia, neuritis, and radiculitis, unspecified 03/20/2014  . Neurologic orthostatic hypotension ([place]) 09/12/2014  . Shingles   . Thyroid disease     Past Surgical History:  Procedure Laterality Date  . radical prostectomy  1994  . removed section of intestine  08/24/2005   polyp    Current Medications: Current Meds  Medication Sig  . AMBULATORY NON FORMULARY MEDICATION Shingles Vaccine Administer 1 injection now and administer 2nd dose in 2-6 months.  Marland Kitchen atorvastatin (LIPITOR) 10 MG tablet Take 1 tablet (10 mg total) by mouth daily.  . famotidine (PEPCID) 20 MG tablet Take 20 mg by mouth daily.  . Glucosamine-Chondroitin 1500-1200 MG/30ML LIQD Take by mouth. Take two tablets per day  . ipratropium (ATROVENT) 0.06 % nasal spray Place 2 sprays into both nostrils every 4 (four) hours as needed.  Marland Kitchen levothyroxine (SYNTHROID) 75 MCG tablet  Take 1 tablet by mouth once daily  . [DISCONTINUED] carvedilol (COREG) 6.25 MG tablet Take 1 tablet (6.25 mg total) by mouth 2 (two) times daily.     Allergies:   Patient has no known allergies.   Social History   Socioeconomic History  . Marital status: Married    Spouse name: Romie Minus  . Number of children: 1  . Years of education: 41  . Highest education level: 12th grade  Occupational History  . Occupation: retired    Comment: Mudlogger  Tobacco Use  . Smoking  status: Former Smoker    Quit date: 12/11/1984    Years since quitting: 35.1  . Smokeless tobacco: Never Used  Substance and Sexual Activity  . Alcohol use: Not Currently  . Drug use: No  . Sexual activity: Not Currently    Partners: Female  Other Topics Concern  . Not on file  Social History Narrative   Drinks caffeine during the day- coffee. Will go walking in neighborhood when the weather is decent.   Social Determinants of Health   Financial Resource Strain:   . Difficulty of Paying Living Expenses: Not on file  Food Insecurity:   . Worried About Charity fundraiser in the Last Year: Not on file  . Ran Out of Food in the Last Year: Not on file  Transportation Needs:   . Lack of Transportation (Medical): Not on file  . Lack of Transportation (Non-Medical): Not on file  Physical Activity:   . Days of Exercise per Week: Not on file  . Minutes of Exercise per Session: Not on file  Stress:   . Feeling of Stress : Not on file  Social Connections:   . Frequency of Communication with Friends and Family: Not on file  . Frequency of Social Gatherings with Friends and Family: Not on file  . Attends Religious Services: Not on file  . Active Member of Clubs or Organizations: Not on file  . Attends Archivist Meetings: Not on file  . Marital Status: Not on file     Family History: The patient's family history includes Prostate cancer in his father; Stroke in his father.  ROS:   Review of Systems  Constitution: Reports dizziness and ringing in the ears.  Negative for decreased appetite, fever and weight gain.  HENT: Reports ringing in the ears.  Negative for congestion, ear discharge, hoarse voice and sore throat.   Eyes: Negative for discharge, redness, vision loss in right eye and visual halos.  Cardiovascular: Negative for chest pain, dyspnea on exertion, leg swelling, orthopnea and palpitations.  Respiratory: Negative for cough, hemoptysis, shortness of breath and  snoring.   Endocrine: Negative for heat intolerance and polyphagia.  Hematologic/Lymphatic: Negative for bleeding problem. Does not bruise/bleed easily.  Skin: Negative for flushing, nail changes, rash and suspicious lesions.  Musculoskeletal: Negative for arthritis, joint pain, muscle cramps, myalgias, neck pain and stiffness.  Gastrointestinal: Negative for abdominal pain, bowel incontinence, diarrhea and excessive appetite.  Genitourinary: Negative for decreased libido, genital sores and incomplete emptying.  Neurological: Negative for brief paralysis, focal weakness, headaches and loss of balance.  Psychiatric/Behavioral: Negative for altered mental status, depression and suicidal ideas.  Allergic/Immunologic: Negative for HIV exposure and persistent infections.    EKGs/Labs/Other Studies Reviewed:    The following studies were reviewed today:   EKG: None today  ZIO monitor: The patient wore the monitor for 3 days 2 hours starting 10/18/2019 . Indication: PVC  The minimum heart rate  was 52 bpm, maximum heart rate was 190 bpm, and average heart rate was 70 bpm. Predominant underlying rhythm was Sinus Rhythm. 7 Supraventricular Tachycardia runs occurred, the run with the fastest interval lasting 6 beats with a maximum rate of 190 bpm, the longest lasting 14.4 secs with an average rate of 98 bpm. Premature atrial complexes were rare (<1.0%). Premature Ventricular complexes were frequent (15.1%, [id number]). VE Couplets were occasional (1.6%, 2466), and VE Triplets were rare (<1.0%, 39). Ventricular Bigeminy and Trigeminy were present. No ventricular tachycardia, No pauses, No AV block and no atrial fibrillation present. 19 patient triggered events all associated with premature ventricular complexes. Conclusion: This study is study is remarkable for Paroxysmal supraventricular tachycardia and symptomatic premature ventricular complexes.  Recent Labs: 05/11/2019: TSH 1.08 08/30/2019: ALT  11 10/18/2019: BUN 18; Creatinine, Ser 1.52; Hemoglobin 13.8; Magnesium 2.2; Platelets 318; Potassium 4.8; Sodium 142  Recent Lipid Panel    Component Value Date/Time   CHOL 105 02/10/2019 0000   TRIG 46 02/10/2019 0000   HDL 44 02/10/2019 0000   CHOLHDL 2.5 01/26/2018 1129   VLDL 18 09/16/2016 0831   LDLCALC 52 02/10/2019 0000   LDLCALC 70 01/26/2018 1129    Physical Exam:    VS:  BP 140/74 (BP Location: Left Arm, Patient Position: Sitting, Cuff Size: Normal)   Pulse 71   Ht 5\' 5"  (1.651 m)   Wt 147 lb (66.7 kg)   SpO2 97%   BMI 24.46 kg/m     Wt Readings from Last 3 Encounters:  01/11/20 147 lb (66.7 kg)  12/04/19 150 lb (68 kg)  11/16/19 147 lb (66.7 kg)     GEN: Well nourished, well developed in no acute distress HEENT: Normal NECK: No JVD; No carotid bruits LYMPHATICS: No lymphadenopathy CARDIAC: S1S2 noted,RRR, no murmurs, rubs, gallops RESPIRATORY:  Clear to auscultation without rales, wheezing or rhonchi  ABDOMEN: Soft, non-tender, non-distended, +bowel sounds, no guarding. EXTREMITIES: No edema, No cyanosis, no clubbing MUSCULOSKELETAL:  No deformity  SKIN: Warm and dry NEUROLOGIC:  Alert and oriented x 3, non-focal PSYCHIATRIC:  Normal affect, good insight  ASSESSMENT:    1. Frequent PVCs   2. Essential hypertension   3. Tinnitus, unspecified laterality    PLAN:    Dizziness-his orthostatic vitals was positive in the office today manually taken lying 140/74 mmHg, sitting 140/74 mmHg, standing 114/70 mmHg.  I will call back on his carvedilol 3.125 mg daily as he had tolerated his dose in the past.  I am holding off on adding any additional antihypertensive due to the orthostatic hypotension.  Because there is also sensation of tinnitus with his dizziness is reasonable for the patient to be evaluated by ENT therefore I am going to refer him at this time.   For his frequent PVCs-he cannot tolerate a higher dose of beta-blocker which may worsen his  symptoms.  He may need to be placed on antiarrhythmic or may be a candidate for PVC ablation.  I will refer him to my EPS colleagues and will defer to them for further treatment plan.  Hypertension-his Coreg has been decreased to 3.125 twice daily.  The patient is in agreement with the above plan. The patient left the office in stable condition.  The patient will follow up in 1 month or sooner if needed.   Medication Adjustments/Labs and Tests Ordered: Current medicines are reviewed at length with the patient today.  Concerns regarding medicines are outlined above.  Orders Placed This Encounter  Procedures  .  Ambulatory referral to Cardiac Electrophysiology  . Ambulatory referral to ENT   Meds ordered this encounter  Medications  . carvedilol (COREG) 3.125 MG tablet    Sig: Take 1 tablet (3.125 mg total) by mouth 2 (two) times daily.    Dispense:  180 tablet    Refill:  1    Patient Instructions  Medication Instructions:  Your physician has recommended you make the following change in your medication:  DECREASE: Coreg (cavedilol) to 3.125 mg Take 1 tab twice daily ( may cut a 6.25 mg in 1/2 and take 1/2 twice daily until you run out)  *If you need a refill on your cardiac medications before your next appointment, please call your pharmacy*  Lab Work: NOne If you have labs (blood work) drawn today and your tests are completely normal, you will receive your results only by: Marland Kitchen MyChart Message (if you have MyChart) OR . A paper copy in the mail If you have any lab test that is abnormal or we need to change your treatment, we will call you to review the results.  Testing/Procedures: NOne  Follow-Up: At [facility], you and your health needs are our priority.  As part of our continuing mission to provide you with exceptional heart care, we have created designated Provider Care Teams.  These Care Teams include your primary Cardiologist (physician) and Advanced Practice Providers  (APPs -  Physician Assistants and Nurse Practitioners) who all work together to provide you with the care you need, when you need it.  Your next appointment:   1 month(s)  The format for your next appointment:   In Person  Provider:   Berniece Salines, DO  Other Instructions YOU ARE BEING REFERRED TO DR. Curt Bears HERE IN [place] FOR YOUR FREQUENT SYMTOMATIC PVC'S.THEY WILL CALL YOU WITH AN APPOINTMENT DATE AND TIME  YOU ARE BEING REFERRED TO DR . Lucia Gaskins ANENT DOCTOR FOR THE RING ING IN YOU EARS. THEY WILL CONTACT YOU WITH ANAPPOINTMENT DATE AND TIME.     Adopting a Healthy Lifestyle.  Know what a healthy weight is for you (roughly BMI <25) and aim to maintain this   Aim for 7+ servings of fruits and vegetables daily   65-80+ fluid ounces of water or unsweet tea for healthy kidneys   Limit to max 1 drink of alcohol per day; avoid smoking/tobacco   Limit animal fats in diet for cholesterol and heart health - choose grass fed whenever available   Avoid highly processed foods, and foods high in saturated/trans fats   Aim for low stress - take time to unwind and care for your mental health   Aim for 150 min of moderate intensity exercise weekly for heart health, and weights twice weekly for bone health   Aim for 7-9 hours of sleep daily   When it comes to diets, agreement about the perfect plan isnt easy to find, even among the experts. Experts at the [employer] developed an idea known as the Healthy Eating Plate. Just imagine a plate divided into logical, healthy portions.   The emphasis is on diet quality:   Load up on vegetables and fruits - one-half of your plate: Aim for color and variety, and remember that potatoes dont count.   Go for whole grains - one-quarter of your plate: Whole wheat, barley, wheat berries, quinoa, oats, brown rice, and foods made with them. If you want pasta, go with whole wheat pasta.   Protein power - one-quarter  of your  plate: Fish, chicken, beans, and nuts are all healthy, versatile protein sources. Limit red meat.   The diet, however, does go beyond the plate, offering a few other suggestions.   Use healthy plant oils, such as olive, canola, soy, corn, sunflower and peanut. Check the labels, and avoid partially hydrogenated oil, which have unhealthy trans fats.   If youre thirsty, drink water. Coffee and tea are good in moderation, but skip sugary drinks and limit milk and dairy products to one or two daily servings.   The type of carbohydrate in the diet is more important than the amount. Some sources of carbohydrates, such as vegetables, fruits, whole grains, and beans-are healthier than others.   Finally, stay active  Signed, Berniece Salines, DO  01/11/2020 11:48 AM    [place]

## 2020-01-11 NOTE — Patient Instructions (Signed)
Medication Instructions:  Your physician has recommended you make the following change in your medication:  DECREASE: Coreg (cavedilol) to 3.125 mg Take 1 tab twice daily ( may cut a 6.25 mg in 1/2 and take 1/2 twice daily until you run out)  *If you need a refill on your cardiac medications before your next appointment, please call your pharmacy*  Lab Work: NOne If you have labs (blood work) drawn today and your tests are completely normal, you will receive your results only by: Marland Kitchen MyChart Message (if you have MyChart) OR . A paper copy in the mail If you have any lab test that is abnormal or we need to change your treatment, we will call you to review the results.  Testing/Procedures: NOne  Follow-Up: At [facility], you and your health needs are our priority.  As part of our continuing mission to provide you with exceptional heart care, we have created designated Provider Care Teams.  These Care Teams include your primary Cardiologist (physician) and Advanced Practice Providers (APPs -  Physician Assistants and Nurse Practitioners) who all work together to provide you with the care you need, when you need it.  Your next appointment:   1 month(s)  The format for your next appointment:   In Person  Provider:   Berniece Salines, DO  Other Instructions YOU ARE BEING REFERRED TO DR. Curt Bears HERE IN [place] FOR YOUR FREQUENT SYMTOMATIC PVC'S.THEY WILL CALL YOU WITH AN APPOINTMENT DATE AND TIME  YOU ARE BEING REFERRED TO DR . Lucia Gaskins ANENT DOCTOR FOR THE RING ING IN YOU EARS. THEY WILL CONTACT YOU WITH ANAPPOINTMENT DATE AND TIME.

## 2020-02-01 LAB — HM DIABETES FOOT EXAM

## 2020-02-08 ENCOUNTER — Ambulatory Visit: Payer: Medicare Other | Admitting: Cardiology

## 2020-02-08 ENCOUNTER — Ambulatory Visit (INDEPENDENT_AMBULATORY_CARE_PROVIDER_SITE_OTHER): Payer: Medicare Other | Admitting: Cardiology

## 2020-02-08 ENCOUNTER — Encounter: Payer: Self-pay | Admitting: Cardiology

## 2020-02-08 ENCOUNTER — Other Ambulatory Visit: Payer: Self-pay

## 2020-02-08 VITALS — BP 138/80 | HR 80 | Temp 97.7°F | Ht 65.0 in | Wt 147.1 lb

## 2020-02-08 DIAGNOSIS — N1831 Chronic kidney disease, stage 3a: Secondary | ICD-10-CM

## 2020-02-08 DIAGNOSIS — I493 Ventricular premature depolarization: Secondary | ICD-10-CM

## 2020-02-08 DIAGNOSIS — R42 Dizziness and giddiness: Secondary | ICD-10-CM | POA: Diagnosis not present

## 2020-02-08 DIAGNOSIS — Z8673 Personal history of transient ischemic attack (TIA), and cerebral infarction without residual deficits: Secondary | ICD-10-CM

## 2020-02-08 DIAGNOSIS — I129 Hypertensive chronic kidney disease with stage 1 through stage 4 chronic kidney disease, or unspecified chronic kidney disease: Secondary | ICD-10-CM | POA: Diagnosis not present

## 2020-02-08 DIAGNOSIS — E782 Mixed hyperlipidemia: Secondary | ICD-10-CM | POA: Diagnosis not present

## 2020-02-08 NOTE — Progress Notes (Signed)
Cardiology Office Note:    Date:  02/08/2020   ID:  [name], DOB 29-Apr-1928, MRN [id number]  PCP:  Gregor Hams, MD  Cardiologist:  Berniece Salines, DO  Electrophysiologist:  None   Referring MD: Gregor Hams, MD   Follow up for PVC and hypertension  History of Present Illness:    Benjamin Bryan a 86 y.o.malewith a hx of hypertension, hyperlipidemia, peripheral artery disease, history of TIA history of chronic kidney disease stage III, and history of frequent PVCs.  Last saw the patient on January 11, 2020 at that time he was still experiencing some dizziness and was orthostatic positive.  Therefore I decrease his carvedilol to 3.125 mg twice a day.  In addition I recommended that he see ENT as the sensation of tightness was also suspected.  He is here for follow-up visit tells me that he has been doing well since we cut back on his medication dose.  He also has not felt these skipped heartbeats in a very long time.  He feels a lot better.  No other complaints at this time.   Past Medical History:  Diagnosis Date  . Atherosclerosis of aortic bifurcation and common iliac arteries ([place]) 01/26/2018   Seen on xray 01/26/18. Normal ABI study 2018  . COPD (chronic obstructive pulmonary disease) ([place]) 10/03/2015  . Frequent PVCs 09/11/2015  . History of prostate cancer 10/03/2015   Radical prostectomy    . History of pulmonary embolus (PE) 10/07/2018   Late October 2019 small clot burden  . History of TIA (transient ischemic attack) 09/11/2015  . Hyperlipidemia   . Hypertension   . Hypertensive kidney disease with stage 3 chronic kidney disease 09/11/2015  . Hypertensive urgency 08/30/2019  . Hypothyroidism 09/11/2015  . Iron deficiency anemia 12/09/2018  . Neuralgia, neuritis, and radiculitis, unspecified 03/20/2014  . Neurologic orthostatic hypotension ([place]) 09/12/2014  . Shingles   . Thyroid disease     Past Surgical History:  Procedure Laterality Date  . radical prostectomy   1994  . removed section of intestine  08/24/2005   polyp    Current Medications: Current Meds  Medication Sig  . AMBULATORY NON FORMULARY MEDICATION Shingles Vaccine Administer 1 injection now and administer 2nd dose in 2-6 months.  Marland Kitchen atorvastatin (LIPITOR) 10 MG tablet Take 1 tablet (10 mg total) by mouth daily.  . carvedilol (COREG) 3.125 MG tablet Take 1 tablet (3.125 mg total) by mouth 2 (two) times daily.  . famotidine (PEPCID) 20 MG tablet Take 20 mg by mouth daily.  . Glucosamine-Chondroitin 1500-1200 MG/30ML LIQD Take by mouth. Take two tablets per day  . ipratropium (ATROVENT) 0.06 % nasal spray Place 2 sprays into both nostrils every 4 (four) hours as needed.  Marland Kitchen levothyroxine (SYNTHROID) 75 MCG tablet Take 1 tablet by mouth once daily     Allergies:   Patient has no known allergies.   Social History   Socioeconomic History  . Marital status: Married    Spouse name: Romie Minus  . Number of children: 1  . Years of education: 35  . Highest education level: 12th grade  Occupational History  . Occupation: retired    Comment: Mudlogger  Tobacco Use  . Smoking status: Former Smoker    Quit date: 12/11/1984    Years since quitting: 35.1  . Smokeless tobacco: Never Used  Substance and Sexual Activity  . Alcohol use: Not Currently  . Drug use: No  . Sexual activity: Not Currently  Partners: Female  Other Topics Concern  . Not on file  Social History Narrative   Drinks caffeine during the day- coffee. Will go walking in neighborhood when the weather is decent.   Social Determinants of Health   Financial Resource Strain:   . Difficulty of Paying Living Expenses: Not on file  Food Insecurity:   . Worried About Charity fundraiser in the Last Year: Not on file  . Ran Out of Food in the Last Year: Not on file  Transportation Needs:   . Lack of Transportation (Medical): Not on file  . Lack of Transportation (Non-Medical): Not on file  Physical Activity:   . Days of  Exercise per Week: Not on file  . Minutes of Exercise per Session: Not on file  Stress:   . Feeling of Stress : Not on file  Social Connections:   . Frequency of Communication with Friends and Family: Not on file  . Frequency of Social Gatherings with Friends and Family: Not on file  . Attends Religious Services: Not on file  . Active Member of Clubs or Organizations: Not on file  . Attends Archivist Meetings: Not on file  . Marital Status: Not on file     Family History: The patient's family history includes Prostate cancer in his father; Stroke in his father.  ROS:   Review of Systems  Constitution: Negative for decreased appetite, fever and weight gain.  HENT: Negative for congestion, ear discharge, hoarse voice and sore throat.   Eyes: Negative for discharge, redness, vision loss in right eye and visual halos.  Cardiovascular: Negative for chest pain, dyspnea on exertion, leg swelling, orthopnea and palpitations.  Respiratory: Negative for cough, hemoptysis, shortness of breath and snoring.   Endocrine: Negative for heat intolerance and polyphagia.  Hematologic/Lymphatic: Negative for bleeding problem. Does not bruise/bleed easily.  Skin: Negative for flushing, nail changes, rash and suspicious lesions.  Musculoskeletal: Negative for arthritis, joint pain, muscle cramps, myalgias, neck pain and stiffness.  Gastrointestinal: Negative for abdominal pain, bowel incontinence, diarrhea and excessive appetite.  Genitourinary: Negative for decreased libido, genital sores and incomplete emptying.  Neurological: Negative for brief paralysis, focal weakness, headaches and loss of balance.  Psychiatric/Behavioral: Negative for altered mental status, depression and suicidal ideas.  Allergic/Immunologic: Negative for HIV exposure and persistent infections.    EKGs/Labs/Other Studies Reviewed:    The following studies were reviewed today:   EKG: None today  ZIO monitor: The  patient wore the monitor for 3 days 2 hours starting 10/18/2019 . Indication: PVC  The minimum heart rate was 52 bpm, maximum heart rate was 190 bpm, and average heart rate was 70 bpm. Predominant underlying rhythm was Sinus Rhythm. 7 Supraventricular Tachycardia runs occurred, the run with the fastest interval lasting 6 beats with a maximum rate of 190 bpm, the longest lasting 14.4 secs with an average rate of 98 bpm. Premature atrial complexes were rare (<1.0%). Premature Ventricular complexes were frequent (15.1%, 46505). VE Couplets were occasional (1.6%, 2466), and VE Triplets were rare (<1.0%, 39). Ventricular Bigeminy and Trigeminy were present. No ventricular tachycardia, No pauses, No AV block and no atrial fibrillation present. 19 patient triggered events all associated with premature ventricular complexes. Conclusion: This study is study is remarkable for Paroxysmal supraventricular tachycardia and symptomatic premature ventricular complexes.  Recent Labs: 05/11/2019: TSH 1.08 08/30/2019: ALT 11 10/18/2019: BUN 18; Creatinine, Ser 1.52; Hemoglobin 13.8; Magnesium 2.2; Platelets 318; Potassium 4.8; Sodium 142  Recent Lipid Panel  Component Value Date/Time   CHOL 105 02/10/2019 0000   TRIG 46 02/10/2019 0000   HDL 44 02/10/2019 0000   CHOLHDL 2.5 01/26/2018 1129   VLDL 18 09/16/2016 0831   LDLCALC 52 02/10/2019 0000   LDLCALC 70 01/26/2018 1129    Physical Exam:    VS:  BP 138/80   Pulse 80   Temp 97.7 F (36.5 C)   Ht 5\' 5"  (1.651 m)   Wt 147 lb 1.3 oz (66.7 kg)   SpO2 97%   BMI 24.48 kg/m     Wt Readings from Last 3 Encounters:  02/08/20 147 lb 1.3 oz (66.7 kg)  01/11/20 147 lb (66.7 kg)  12/04/19 150 lb (68 kg)     GEN: Well nourished, well developed in no acute distress HEENT: Normal NECK: No JVD; No carotid bruits LYMPHATICS: No lymphadenopathy CARDIAC: S1S2 noted,RRR, no murmurs, rubs, gallops RESPIRATORY:  Clear to auscultation without rales, wheezing  or rhonchi  ABDOMEN: Soft, non-tender, non-distended, +bowel sounds, no guarding. EXTREMITIES: No edema, No cyanosis, no clubbing MUSCULOSKELETAL:  No deformity  SKIN: Warm and dry NEUROLOGIC:  Alert and oriented x 3, non-focal PSYCHIATRIC:  Normal affect, good insight  ASSESSMENT:    1. Frequent PVCs   2. Hypertensive kidney disease with stage 3a chronic kidney disease   3. History of TIA (transient ischemic attack)   4. Mixed hyperlipidemia   5. Dizziness    PLAN:     1.  Symptoms have improved for his PVCs.  He will continue on his Coreg 3.125 mg twice daily.  2.  Dizziness resolved since we cut back on his medication no orthostatic hypotension.  3.  Blood pressure is appropriate in the office today.  No changes will be made to his medication.  4.  Hyperlipidemia continue patient's atorvastatin.  The patient is in agreement with the above plan. The patient left the office in stable condition.  The patient will follow up in   Medication Adjustments/Labs and Tests Ordered: Current medicines are reviewed at length with the patient today.  Concerns regarding medicines are outlined above.  No orders of the defined types were placed in this encounter.  No orders of the defined types were placed in this encounter.   Patient Instructions  Medication Instructions:  Your physician recommends that you continue on your current medications as directed. Please refer to the Current Medication list given to you today.  *If you need a refill on your cardiac medications before your next appointment, please call your pharmacy*   Lab Work: None If you have labs (blood work) drawn today and your tests are completely normal, you will receive your results only by: Marland Kitchen MyChart Message (if you have MyChart) OR . A paper copy in the mail If you have any lab test that is abnormal or we need to change your treatment, we will call you to review the  results.   Testing/Procedures: None   Follow-Up: At [facility], you and your health needs are our priority.  As part of our continuing mission to provide you with exceptional heart care, we have created designated Provider Care Teams.  These Care Teams include your primary Cardiologist (physician) and Advanced Practice Providers (APPs -  Physician Assistants and Nurse Practitioners) who all work together to provide you with the care you need, when you need it.  We recommend signing up for the patient portal called "MyChart".  Sign up information is provided on this After Visit Summary.  MyChart is  used to connect with patients for Virtual Visits (Telemedicine).  Patients are able to view lab/test results, encounter notes, upcoming appointments, etc.  Non-urgent messages can be sent to your provider as well.   To learn more about what you can do with MyChart, go to [email or web address].    Your next appointment:   6 month(s)  The format for your next appointment:   In Person  Provider:   Berniece Salines, DO   Other Instructions       Adopting a Healthy Lifestyle.  Know what a healthy weight is for you (roughly BMI <25) and aim to maintain this   Aim for 7+ servings of fruits and vegetables daily   65-80+ fluid ounces of water or unsweet tea for healthy kidneys   Limit to max 1 drink of alcohol per day; avoid smoking/tobacco   Limit animal fats in diet for cholesterol and heart health - choose grass fed whenever available   Avoid highly processed foods, and foods high in saturated/trans fats   Aim for low stress - take time to unwind and care for your mental health   Aim for 150 min of moderate intensity exercise weekly for heart health, and weights twice weekly for bone health   Aim for 7-9 hours of sleep daily   When it comes to diets, agreement about the perfect plan isnt easy to find, even among the experts. Experts at the [place]  developed an idea known as the Healthy Eating Plate. Just imagine a plate divided into logical, healthy portions.   The emphasis is on diet quality:   Load up on vegetables and fruits - one-half of your plate: Aim for color and variety, and remember that potatoes dont count.   Go for whole grains - one-quarter of your plate: Whole wheat, barley, wheat berries, quinoa, oats, brown rice, and foods made with them. If you want pasta, go with whole wheat pasta.   Protein power - one-quarter of your plate: Fish, chicken, beans, and nuts are all healthy, versatile protein sources. Limit red meat.   The diet, however, does go beyond the plate, offering a few other suggestions.   Use healthy plant oils, such as olive, canola, soy, corn, sunflower and peanut. Check the labels, and avoid partially hydrogenated oil, which have unhealthy trans fats.   If youre thirsty, drink water. Coffee and tea are good in moderation, but skip sugary drinks and limit milk and dairy products to one or two daily servings.   The type of carbohydrate in the diet is more important than the amount. Some sources of carbohydrates, such as vegetables, fruits, whole grains, and beans-are healthier than others.   Finally, stay active  Signed, Berniece Salines, DO  02/08/2020 4:19 PM    [facility]

## 2020-02-08 NOTE — Patient Instructions (Signed)

## 2020-02-09 ENCOUNTER — Encounter: Payer: Self-pay | Admitting: Family Medicine

## 2020-02-09 ENCOUNTER — Ambulatory Visit (INDEPENDENT_AMBULATORY_CARE_PROVIDER_SITE_OTHER): Payer: Medicare Other | Admitting: Family Medicine

## 2020-02-09 DIAGNOSIS — I872 Venous insufficiency (chronic) (peripheral): Secondary | ICD-10-CM | POA: Diagnosis not present

## 2020-02-09 DIAGNOSIS — L821 Other seborrheic keratosis: Secondary | ICD-10-CM | POA: Diagnosis not present

## 2020-02-09 MED ORDER — TRIAMCINOLONE ACETONIDE 0.1 % EX CREA: 1.0000 "application " | TOPICAL_CREAM | Freq: Two times a day (BID) | CUTANEOUS | 0 refills | Status: DC

## 2020-02-09 NOTE — Assessment & Plan Note (Signed)
Start triamcinolone bid as needed to help with itching.  Discussed using a good moisturizer such as eucerin or cerave in between applications.  Keep legs elevated at rest and/or try compression stockings.

## 2020-02-09 NOTE — Progress Notes (Signed)
Benjamin Bryan - 86 y.o. male MRN [id number]  Date of birth: May 28, 1928  Subjective Chief Complaint  Patient presents with  . Other    legs and ankles itching x 2 weeks.    HPI Benjamin Bryan is a 86 y.o. male with history of hypothyroidism, CKD, and HTN here today with complaint of itching.  Reports itching present around lower legs and ankles for several weeks.  He also has some spots on his abdomen and back that itch.  He denies changes to soaps or detergents.   He has not started any new medications recently.  He has not noticed obvious rash.    ROS:  A comprehensive ROS was completed and negative except as noted per HPI  No Known Allergies  Past Medical History:  Diagnosis Date  . Atherosclerosis of aortic bifurcation and common iliac arteries ([place]) 01/26/2018   Seen on xray 01/26/18. Normal ABI study 2018  . COPD (chronic obstructive pulmonary disease) ([place]) 10/03/2015  . Frequent PVCs 09/11/2015  . History of prostate cancer 10/03/2015   Radical prostectomy    . History of pulmonary embolus (PE) 10/07/2018   Late October 2019 small clot burden  . History of TIA (transient ischemic attack) 09/11/2015  . Hyperlipidemia   . Hypertension   . Hypertensive kidney disease with stage 3 chronic kidney disease 09/11/2015  . Hypertensive urgency 08/30/2019  . Hypothyroidism 09/11/2015  . Iron deficiency anemia 12/09/2018  . Neuralgia, neuritis, and radiculitis, unspecified 03/20/2014  . Neurologic orthostatic hypotension ([place]) 09/12/2014  . Shingles   . Thyroid disease     Past Surgical History:  Procedure Laterality Date  . radical prostectomy  1994  . removed section of intestine  08/24/2005   polyp    Social History   Socioeconomic History  . Marital status: Married    Spouse name: Romie Minus  . Number of children: 1  . Years of education: 37  . Highest education level: 12th grade  Occupational History  . Occupation: retired    Comment: Mudlogger  Tobacco Use  . Smoking  status: Former Smoker    Quit date: 12/11/1984    Years since quitting: 35.1  . Smokeless tobacco: Never Used  Substance and Sexual Activity  . Alcohol use: Not Currently  . Drug use: No  . Sexual activity: Not Currently    Partners: Female  Other Topics Concern  . Not on file  Social History Narrative   Drinks caffeine during the day- coffee. Will go walking in neighborhood when the weather is decent.   Social Determinants of Health   Financial Resource Strain:   . Difficulty of Paying Living Expenses: Not on file  Food Insecurity:   . Worried About Charity fundraiser in the Last Year: Not on file  . Ran Out of Food in the Last Year: Not on file  Transportation Needs:   . Lack of Transportation (Medical): Not on file  . Lack of Transportation (Non-Medical): Not on file  Physical Activity:   . Days of Exercise per Week: Not on file  . Minutes of Exercise per Session: Not on file  Stress:   . Feeling of Stress : Not on file  Social Connections:   . Frequency of Communication with Friends and Family: Not on file  . Frequency of Social Gatherings with Friends and Family: Not on file  . Attends Religious Services: Not on file  . Active Member of Clubs or Organizations: Not on file  . Attends Club  or Organization Meetings: Not on file  . Marital Status: Not on file    Family History  Problem Relation Age of Onset  . Prostate cancer Father   . Stroke Father     Health Maintenance  Topic Date Due  . TETANUS/TDAP  01/27/2028  . INFLUENZA VACCINE  Completed  . PNA vac Low Risk Adult  Completed     ----------------------------------------------------------------------------------------------------------------------------------------------------------------------------------------------------------------- Physical Exam BP 128/78   Pulse 81   Temp 97.6 F (36.4 C) (Oral)   Ht 5\' 5"  (1.651 m)   Wt 148 lb (67.1 kg)   BMI 24.63 kg/m   Physical Exam Constitutional:       Appearance: Normal appearance.  HENT:     Head: Normocephalic and atraumatic.  Eyes:     General: No scleral icterus. Cardiovascular:     Rate and Rhythm: Normal rate and regular rhythm.  Pulmonary:     Effort: Pulmonary effort is normal.     Breath sounds: Normal breath sounds.  Skin:    Comments: Several SK along back and abdomen.   Some scaling and venous stasis changes along bilateral lower legs.  Trace edema bilaterally.   Neurological:     General: No focal deficit present.     Mental Status: He is alert.  Psychiatric:        Mood and Affect: Mood normal.        Behavior: Behavior normal.     ------------------------------------------------------------------------------------------------------------------------------------------------------------------------------------------------------------------- Assessment and Plan  Stasis dermatitis Start triamcinolone bid as needed to help with itching.  Discussed using a good moisturizer such as eucerin or cerave in between applications.  Keep legs elevated at rest and/or try compression stockings.   SK (seborrheic keratosis) Discussed benign nature of these lesions.  He can try triamcinolone to these areas as well if needed for itching.    Meds ordered this encounter  Medications  . triamcinolone cream (KENALOG) 0.1 %    Sig: Apply 1 application topically 2 (two) times daily. Use for up to two weeks at a time.    Dispense:  60 g    Refill:  0    No follow-ups on file.    This visit occurred during the SARS-CoV-2 public health emergency.  Safety protocols were in place, including screening questions prior to the visit, additional usage of staff PPE, and extensive cleaning of exam room while observing appropriate contact time as indicated for disinfecting solutions.

## 2020-02-09 NOTE — Patient Instructions (Signed)
Stasis Dermatitis Stasis dermatitis is a long-term (chronic) skin condition that happens when veins can no longer pump blood back to the heart (poor circulation). This condition causes a red or brown scaly rash or sores (ulcers) from the pooling of blood (stasis). This condition usually affects the lower legs. It may affect one leg or both legs. Without treatment, severe stasis dermatitis can lead to other skin conditions and infections. What are the causes? This condition is caused by poor circulation. What increases the risk? You are more likely to develop this condition if:  You are not very active.  You stand for long periods of time.  You have veins that have become enlarged and twisted (varicose veins).  You have leg veins that are not strong enough to send blood back to the heart (venous insufficiency).  You have had a blood clot.  You have been pregnant many times.  You have had vein surgery.  You are obese.  You have heart or kidney failure.  You are 50 years of age or older.  You have had injuries to your legs in the past. What are the signs or symptoms? Common early symptoms of this condition include:  Itchiness in one or both of your legs.  Swelling in your ankle or leg. This might get better overnight but be worse again during the day.  Skin that looks thin on your ankle and leg.  Red or brown marks that develop slowly.  Skin that is dry, cracked, or easily irritated.  Red, swollen skin that is sore or has a burning feeling.  An achy or heavy feeling after you walk or stand for long periods of time.  Pain. Later and more severe symptoms of this condition include:  Skin that looks shiny.  Small, open sores (ulcers). These are often red or purple and leak fluid.  Skin that feels hard.  Severe itching.  A change in the shape or color of your lower legs.  Severe pain.  Difficulty walking. How is this diagnosed? This condition may be diagnosed  based on:  Your symptoms and medical history.  A physical exam. You may also have tests, including:  Blood tests.  Imaging tests to check blood flow (Doppler ultrasound).  Allergy tests. You may need to see a health care provider who specializes in skin diseases (dermatologist). How is this treated? This condition may be treated with:  Compression stockings or an elastic wrap to improve circulation.  Medicines, such as: ? Corticosteroid creams and ointments. ? Non-corticosteroid medicines applied to the skin (topical). ? Medicine to reduce swelling in the legs (diuretics). ? Antibiotics. ? Medicine to relieve itching (antihistamines).  A bandage (dressing).  A wrap that contains zinc and gelatin (Unna boot). Follow these instructions at home: Skin care  Moisturize your skin as told by your health care provider. Do not use moisturizers with fragrance. This can irritate your skin.  Apply a cool, wet cloth (cool compress) to the affected areas.  Do not scratch your skin.  Do not rub your skin dry after a bath or shower. Gently pat your skin dry.  Do not use scented soaps, detergents, or perfumes. Medicines  Take or use over-the-counter and prescription medicines only as told by your health care provider.  If you were prescribed an antibiotic medicine, take or use it as told by your health care provider. Do not stop taking or using the antibiotic even if your condition improves. Activity  Walk as told by your health care   provider. Walking increases blood flow.  Do calf and ankle exercises throughout the day as told by your health care provider. This will help increase blood flow.  Raise (elevate) your legs above the level of your heart when you are sitting or lying down. Lifestyle  Work with your health care provider to lose weight, if needed.  Do not cross your legs when you sit.  Do not stand or sit in one position for long periods of time.  Wear comfortable,  loose-fitting clothing. Circulation in your legs will be worse if you wear tight pants, belts, and waistbands.  Do not use any products that contain nicotine or tobacco, such as cigarettes, e-cigarettes, and chewing tobacco. If you need help quitting, ask your health care provider. General instructions  If you were asked to use one of the following to help with your condition, follow instructions from your health care provider on how to: ? Remove and change any dressing. ? Wear compression stockings. These stockings help to prevent blood clots and reduce swelling in your legs. ? Wear the Unna boot.  Keep all follow-up visits as told by your health care provider. This is important. Contact a health care provider if:  Your condition does not improve with treatment.  Your condition gets worse.  You have signs of infection in the affected area. Watch for: ? Swelling. ? Tenderness. ? Redness. ? Soreness. ? Warmth.  You have a fever. Get help right away if:  You notice red streaks coming from the affected area.  Your bone or joint underneath the affected area becomes painful after the skin has healed.  The affected area turns darker.  You feel a deep pain in your leg or groin.  You are short of breath. Summary  Stasis dermatitis is a long-term (chronic) skin condition that happens when veins can no longer pump blood back to the heart (poor circulation).  Wear compression stockings as told by your health care provider. These stockings help to prevent blood clots and reduce swelling in your legs.  Follow instructions from your health care provider about activity, medicines, and lifestyle.  Contact a health care provider if you have a fever or have signs of infection in the affected area.  Keep all follow-up visits as told by your health care provider. This is important. This information is not intended to replace advice given to you by your health care provider. Make sure you  discuss any questions you have with your health care provider. Document Revised: 04/25/2018 Document Reviewed: 04/25/2018 Elsevier Patient Education  2020 [employer]  

## 2020-02-09 NOTE — Assessment & Plan Note (Signed)
Discussed benign nature of these lesions.  He can try triamcinolone to these areas as well if needed for itching.

## 2020-02-12 ENCOUNTER — Ambulatory Visit: Payer: Medicare Other | Admitting: Cardiology

## 2020-05-06 ENCOUNTER — Other Ambulatory Visit: Payer: Self-pay | Admitting: Sports Medicine

## 2020-05-07 NOTE — Telephone Encounter (Signed)
To PCP

## 2020-05-19 ENCOUNTER — Other Ambulatory Visit: Payer: Self-pay | Admitting: Family Medicine

## 2020-05-20 DIAGNOSIS — L853 Xerosis cutis: Secondary | ICD-10-CM | POA: Diagnosis not present

## 2020-05-20 DIAGNOSIS — L821 Other seborrheic keratosis: Secondary | ICD-10-CM | POA: Diagnosis not present

## 2020-05-20 DIAGNOSIS — L218 Other seborrheic dermatitis: Secondary | ICD-10-CM | POA: Diagnosis not present

## 2020-05-21 ENCOUNTER — Other Ambulatory Visit: Payer: Self-pay

## 2020-05-21 MED ORDER — IPRATROPIUM BROMIDE 0.06 % NA SOLN: 2.0000 | NASAL | 6 refills | Status: DC | PRN

## 2020-05-27 DIAGNOSIS — H26493 Other secondary cataract, bilateral: Secondary | ICD-10-CM | POA: Diagnosis not present

## 2020-05-27 DIAGNOSIS — H527 Unspecified disorder of refraction: Secondary | ICD-10-CM | POA: Diagnosis not present

## 2020-05-27 DIAGNOSIS — H02535 Eyelid retraction left lower eyelid: Secondary | ICD-10-CM | POA: Diagnosis not present

## 2020-05-27 DIAGNOSIS — H353131 Nonexudative age-related macular degeneration, bilateral, early dry stage: Secondary | ICD-10-CM | POA: Diagnosis not present

## 2020-05-27 DIAGNOSIS — H02002 Unspecified entropion of right lower eyelid: Secondary | ICD-10-CM | POA: Diagnosis not present

## 2020-05-27 DIAGNOSIS — H04123 Dry eye syndrome of bilateral lacrimal glands: Secondary | ICD-10-CM | POA: Diagnosis not present

## 2020-06-02 ENCOUNTER — Other Ambulatory Visit: Payer: Self-pay | Admitting: Cardiology

## 2020-06-04 DIAGNOSIS — H353131 Nonexudative age-related macular degeneration, bilateral, early dry stage: Secondary | ICD-10-CM | POA: Diagnosis not present

## 2020-06-04 DIAGNOSIS — H04123 Dry eye syndrome of bilateral lacrimal glands: Secondary | ICD-10-CM | POA: Diagnosis not present

## 2020-06-04 DIAGNOSIS — H26491 Other secondary cataract, right eye: Secondary | ICD-10-CM | POA: Diagnosis not present

## 2020-07-10 DIAGNOSIS — H35712 Central serous chorioretinopathy, left eye: Secondary | ICD-10-CM | POA: Diagnosis not present

## 2020-07-11 DIAGNOSIS — H538 Other visual disturbances: Secondary | ICD-10-CM | POA: Diagnosis not present

## 2020-07-11 DIAGNOSIS — H353131 Nonexudative age-related macular degeneration, bilateral, early dry stage: Secondary | ICD-10-CM | POA: Diagnosis not present

## 2020-07-11 DIAGNOSIS — H43812 Vitreous degeneration, left eye: Secondary | ICD-10-CM | POA: Diagnosis not present

## 2020-07-11 DIAGNOSIS — H04123 Dry eye syndrome of bilateral lacrimal glands: Secondary | ICD-10-CM | POA: Diagnosis not present

## 2020-07-13 ENCOUNTER — Other Ambulatory Visit: Payer: Self-pay | Admitting: Cardiology

## 2020-07-29 ENCOUNTER — Other Ambulatory Visit: Payer: Self-pay | Admitting: Family Medicine

## 2020-08-14 ENCOUNTER — Other Ambulatory Visit: Payer: Self-pay

## 2020-08-14 ENCOUNTER — Encounter: Payer: Self-pay | Admitting: Cardiology

## 2020-08-14 ENCOUNTER — Ambulatory Visit: Payer: Medicare Other | Admitting: Cardiology

## 2020-08-14 VITALS — BP 142/88 | HR 72 | Ht 65.0 in | Wt 144.1 lb

## 2020-08-14 DIAGNOSIS — I493 Ventricular premature depolarization: Secondary | ICD-10-CM

## 2020-08-14 DIAGNOSIS — N1831 Chronic kidney disease, stage 3a: Secondary | ICD-10-CM

## 2020-08-14 DIAGNOSIS — I129 Hypertensive chronic kidney disease with stage 1 through stage 4 chronic kidney disease, or unspecified chronic kidney disease: Secondary | ICD-10-CM

## 2020-08-14 DIAGNOSIS — E782 Mixed hyperlipidemia: Secondary | ICD-10-CM | POA: Diagnosis not present

## 2020-08-14 NOTE — Patient Instructions (Addendum)
Medication Instructions:  Your physician recommends that you continue on your current medications as directed. Please refer to the Current Medication list given to you today.  *If you need a refill on your cardiac medications before your next appointment, please call your pharmacy*   Lab Work: None If you have labs (blood work) drawn today and your tests are completely normal, you will receive your results only by: Marland Kitchen MyChart Message (if you have MyChart) OR . A paper copy in the mail If you have any lab test that is abnormal or we need to change your treatment, we will call you to review the results.   Testing/Procedures: None   Follow-Up: At [facility], you and your health needs are our priority.  As part of our continuing mission to provide you with exceptional heart care, we have created designated Provider Care Teams.  These Care Teams include your primary Cardiologist (physician) and Advanced Practice Providers (APPs -  Physician Assistants and Nurse Practitioners) who all work together to provide you with the care you need, when you need it.  We recommend signing up for the patient portal called "MyChart".  Sign up information is provided on this After Visit Summary.  MyChart is used to connect with patients for Virtual Visits (Telemedicine).  Patients are able to view lab/test results, encounter notes, upcoming appointments, etc.  Non-urgent messages can be sent to your provider as well.   To learn more about what you can do with MyChart, go to [email or web address].    Your next appointment:   3 month(s)  The format for your next appointment:   In Person  Provider:   Berniece Salines, DO   Other Instructions Please bring all of your medications with you to your next appointment

## 2020-08-14 NOTE — Progress Notes (Signed)
Cardiology Office Note:    Date:  08/14/2020   ID:  [name], DOB 29-Sep-1928, MRN [id number]  PCP:  Luetta Nutting, DO  Cardiologist:  Berniece Salines, DO  Electrophysiologist:  None   Referring MD: Luetta Nutting, DO   " I am doing well"  History of Present Illness:    Benjamin Bryan is a 86 y.o. male with a hx of hypertension, hyperlipidemia, peripheral artery disease, history of TIA history of chronic kidney disease stage III, and  frequent PVCs.  I last saw the patient back in March 2021 at that time this was after we decrease his carvedilol to 3.125 mg daily.  During our visit he did tell me that his dizziness had resolved.  And he was orthostatic negative.  He has been doing well from a cardiovascular standpoint.  Her dizziness since the decrease in his medication dose has not recurred.  According to his wife he is doing well.  Past Medical History:  Diagnosis Date  . Atherosclerosis of aortic bifurcation and common iliac arteries ([place]) 01/26/2018   Seen on xray 01/26/18. Normal ABI study 2018  . COPD (chronic obstructive pulmonary disease) ([place]) 10/03/2015  . Frequent PVCs 09/11/2015  . History of prostate cancer 10/03/2015   Radical prostectomy    . History of pulmonary embolus (PE) 10/07/2018   Late October 2019 small clot burden  . History of TIA (transient ischemic attack) 09/11/2015  . Hyperlipidemia   . Hypertension   . Hypertensive kidney disease with stage 3 chronic kidney disease 09/11/2015  . Hypertensive urgency 08/30/2019  . Hypothyroidism 09/11/2015  . Iron deficiency anemia 12/09/2018  . Neuralgia, neuritis, and radiculitis, unspecified 03/20/2014  . Neurologic orthostatic hypotension ([place]) 09/12/2014  . Shingles   . Thyroid disease     Past Surgical History:  Procedure Laterality Date  . radical prostectomy  1994  . removed section of intestine  08/24/2005   polyp    Current Medications: Current Meds  Medication Sig  . AMBULATORY NON FORMULARY  MEDICATION Shingles Vaccine Administer 1 injection now and administer 2nd dose in 2-6 months.  Marland Kitchen ammonium lactate (LAC-HYDRIN) 12 % lotion Apply 1 application topically 2 (two) times daily.  Marland Kitchen atorvastatin (LIPITOR) 10 MG tablet Take 1 tablet by mouth once daily  . carvedilol (COREG) 3.125 MG tablet Take 1 tablet by mouth twice daily  . ipratropium (ATROVENT) 0.06 % nasal spray Place 2 sprays into both nostrils every 4 (four) hours as needed.  Marland Kitchen ketoconazole (NIZORAL) 2 % shampoo Apply 1 application topically as needed. Every 4th day  . triamcinolone cream (KENALOG) 0.1 % Apply 1 application topically 2 (two) times daily. Use for up to two weeks at a time.     Allergies:   Patient has no known allergies.   Social History   Socioeconomic History  . Marital status: Married    Spouse name: Romie Minus  . Number of children: 1  . Years of education: 54  . Highest education level: 12th grade  Occupational History  . Occupation: retired    Comment: Mudlogger  Tobacco Use  . Smoking status: Former Smoker    Quit date: 12/11/1984    Years since quitting: 35.6  . Smokeless tobacco: Never Used  Vaping Use  . Vaping Use: Never used  Substance and Sexual Activity  . Alcohol use: Not Currently  . Drug use: No  . Sexual activity: Not Currently    Partners: Female  Other Topics Concern  . Not on file  Social History Narrative   Drinks caffeine during the day- coffee. Will go walking in neighborhood when the weather is decent.   Social Determinants of Health   Financial Resource Strain:   . Difficulty of Paying Living Expenses: Not on file  Food Insecurity:   . Worried About Charity fundraiser in the Last Year: Not on file  . Ran Out of Food in the Last Year: Not on file  Transportation Needs:   . Lack of Transportation (Medical): Not on file  . Lack of Transportation (Non-Medical): Not on file  Physical Activity:   . Days of Exercise per Week: Not on file  . Minutes of Exercise per  Session: Not on file  Stress:   . Feeling of Stress : Not on file  Social Connections:   . Frequency of Communication with Friends and Family: Not on file  . Frequency of Social Gatherings with Friends and Family: Not on file  . Attends Religious Services: Not on file  . Active Member of Clubs or Organizations: Not on file  . Attends Archivist Meetings: Not on file  . Marital Status: Not on file     Family History: The patient's family history includes Prostate cancer in his father; Stroke in his father.  ROS:   Review of Systems  Constitution: Negative for decreased appetite, fever and weight gain.  HENT: Negative for congestion, ear discharge, hoarse voice and sore throat.   Eyes: Negative for discharge, redness, vision loss in right eye and visual halos.  Cardiovascular: Negative for chest pain, dyspnea on exertion, leg swelling, orthopnea and palpitations.  Respiratory: Negative for cough, hemoptysis, shortness of breath and snoring.   Endocrine: Negative for heat intolerance and polyphagia.  Hematologic/Lymphatic: Negative for bleeding problem. Does not bruise/bleed easily.  Skin: Negative for flushing, nail changes, rash and suspicious lesions.  Musculoskeletal: Negative for arthritis, joint pain, muscle cramps, myalgias, neck pain and stiffness.  Gastrointestinal: Negative for abdominal pain, bowel incontinence, diarrhea and excessive appetite.  Genitourinary: Negative for decreased libido, genital sores and incomplete emptying.  Neurological: Negative for brief paralysis, focal weakness, headaches and loss of balance.  Psychiatric/Behavioral: Negative for altered mental status, depression and suicidal ideas.  Allergic/Immunologic: Negative for HIV exposure and persistent infections.    EKGs/Labs/Other Studies Reviewed:    The following studies were reviewed today:   EKG:  The ekg ordered today demonstrates sinus rhythm, heart rate 74 bpm with occasional  PVCs. Recent Labs: 08/30/2019: ALT 11 10/18/2019: BUN 18; Creatinine, Ser 1.52; Hemoglobin 13.8; Magnesium 2.2; Platelets 318; Potassium 4.8; Sodium 142  Recent Lipid Panel    Component Value Date/Time   CHOL 105 02/10/2019 0000   TRIG 46 02/10/2019 0000   HDL 44 02/10/2019 0000   CHOLHDL 2.5 01/26/2018 1129   VLDL 18 09/16/2016 0831   LDLCALC 52 02/10/2019 0000   LDLCALC 70 01/26/2018 1129    Physical Exam:    VS:  BP (!) 142/88   Pulse 72   Ht 5\' 5"  (1.651 m)   Wt 144 lb 1.3 oz (65.4 kg)   SpO2 99%   BMI 23.98 kg/m     Wt Readings from Last 3 Encounters:  08/14/20 144 lb 1.3 oz (65.4 kg)  02/09/20 148 lb (67.1 kg)  02/08/20 147 lb 1.3 oz (66.7 kg)     GEN: Well nourished, well developed in no acute distress HEENT: Normal NECK: No JVD; No carotid bruits LYMPHATICS: No lymphadenopathy CARDIAC: S1S2 noted,RRR, no murmurs, rubs, gallops  RESPIRATORY:  Clear to auscultation without rales, wheezing or rhonchi  ABDOMEN: Soft, non-tender, non-distended, +bowel sounds, no guarding. EXTREMITIES: No edema, No cyanosis, no clubbing MUSCULOSKELETAL:  No deformity  SKIN: Warm and dry NEUROLOGIC:  Alert and oriented x 3, non-focal PSYCHIATRIC:  Normal affect, good insight  ASSESSMENT:    1. Frequent PVCs   2. Hypertensive kidney disease with stage 3a chronic kidney disease   3. Mixed hyperlipidemia    PLAN:     1.  His blood pressure manually repeated by me 142/80 mmHg.  Going to continue patient his current medication regimen.  I rather keep his blood pressure slightly higher than increase his blood pressure medication as he did have a problem in the past with orthostatic hypotension as well as dizziness on higher dose of medication.  He did show me some readings at home which is in the 160s and 170s.  I have asked the patient to continue to take his s blood pressure and we will revisit this in 3 months from now again.  2.  He is now asymptomatic with his PVCs we will  continue to monitor.  3.  He will remain on his atorvastatin for his hyperlipidemia.  The patient is in agreement with the above plan. The patient left the office in stable condition.  The patient will follow up in 3 months or sooner if needed.   Medication Adjustments/Labs and Tests Ordered: Current medicines are reviewed at length with the patient today.  Concerns regarding medicines are outlined above.  No orders of the defined types were placed in this encounter.  No orders of the defined types were placed in this encounter.   Patient Instructions  Medication Instructions:  Your physician recommends that you continue on your current medications as directed. Please refer to the Current Medication list given to you today.  *If you need a refill on your cardiac medications before your next appointment, please call your pharmacy*   Lab Work: None If you have labs (blood work) drawn today and your tests are completely normal, you will receive your results only by: Marland Kitchen MyChart Message (if you have MyChart) OR . A paper copy in the mail If you have any lab test that is abnormal or we need to change your treatment, we will call you to review the results.   Testing/Procedures: None   Follow-Up: At [facility], you and your health needs are our priority.  As part of our continuing mission to provide you with exceptional heart care, we have created designated Provider Care Teams.  These Care Teams include your primary Cardiologist (physician) and Advanced Practice Providers (APPs -  Physician Assistants and Nurse Practitioners) who all work together to provide you with the care you need, when you need it.  We recommend signing up for the patient portal called "MyChart".  Sign up information is provided on this After Visit Summary.  MyChart is used to connect with patients for Virtual Visits (Telemedicine).  Patients are able to view lab/test results, encounter notes, upcoming  appointments, etc.  Non-urgent messages can be sent to your provider as well.   To learn more about what you can do with MyChart, go to [email or web address].    Your next appointment:   3 month(s)  The format for your next appointment:   In Person  Provider:   Berniece Salines, DO   Other Instructions Please bring all of your medications with you to your next appointment      Adopting  a Healthy Lifestyle.  Know what a healthy weight is for you (roughly BMI <25) and aim to maintain this   Aim for 7+ servings of fruits and vegetables daily   65-80+ fluid ounces of water or unsweet tea for healthy kidneys   Limit to max 1 drink of alcohol per day; avoid smoking/tobacco   Limit animal fats in diet for cholesterol and heart health - choose grass fed whenever available   Avoid highly processed foods, and foods high in saturated/trans fats   Aim for low stress - take time to unwind and care for your mental health   Aim for 150 min of moderate intensity exercise weekly for heart health, and weights twice weekly for bone health   Aim for 7-9 hours of sleep daily   When it comes to diets, agreement about the perfect plan isnt easy to find, even among the experts. Experts at the [employer] developed an idea known as the Healthy Eating Plate. Just imagine a plate divided into logical, healthy portions.   The emphasis is on diet quality:   Load up on vegetables and fruits - one-half of your plate: Aim for color and variety, and remember that potatoes dont count.   Go for whole grains - one-quarter of your plate: Whole wheat, barley, wheat berries, quinoa, oats, brown rice, and foods made with them. If you want pasta, go with whole wheat pasta.   Protein power - one-quarter of your plate: Fish, chicken, beans, and nuts are all healthy, versatile protein sources. Limit red meat.   The diet, however, does go beyond the plate, offering a few other suggestions.    Use healthy plant oils, such as olive, canola, soy, corn, sunflower and peanut. Check the labels, and avoid partially hydrogenated oil, which have unhealthy trans fats.   If youre thirsty, drink water. Coffee and tea are good in moderation, but skip sugary drinks and limit milk and dairy products to one or two daily servings.   The type of carbohydrate in the diet is more important than the amount. Some sources of carbohydrates, such as vegetables, fruits, whole grains, and beans-are healthier than others.   Finally, stay active  Signed, Berniece Salines, DO  08/14/2020 12:00 PM    [place]

## 2020-08-15 DIAGNOSIS — H43812 Vitreous degeneration, left eye: Secondary | ICD-10-CM | POA: Diagnosis not present

## 2020-08-15 DIAGNOSIS — H04123 Dry eye syndrome of bilateral lacrimal glands: Secondary | ICD-10-CM | POA: Diagnosis not present

## 2020-08-19 NOTE — Addendum Note (Signed)
Addended by: Resa Miner I on: 08/19/2020 10:54 AM   Modules accepted: Orders

## 2020-08-29 ENCOUNTER — Encounter: Payer: Self-pay | Admitting: Family Medicine

## 2020-08-29 ENCOUNTER — Ambulatory Visit (INDEPENDENT_AMBULATORY_CARE_PROVIDER_SITE_OTHER): Payer: Medicare Other | Admitting: Family Medicine

## 2020-08-29 ENCOUNTER — Other Ambulatory Visit: Payer: Self-pay

## 2020-08-29 VITALS — BP 180/100 | HR 70 | Temp 97.9°F | Ht 64.96 in | Wt 145.7 lb

## 2020-08-29 DIAGNOSIS — E782 Mixed hyperlipidemia: Secondary | ICD-10-CM | POA: Diagnosis not present

## 2020-08-29 DIAGNOSIS — I10 Essential (primary) hypertension: Secondary | ICD-10-CM | POA: Diagnosis not present

## 2020-08-29 DIAGNOSIS — E039 Hypothyroidism, unspecified: Secondary | ICD-10-CM | POA: Diagnosis not present

## 2020-08-29 MED ORDER — LEVOTHYROXINE SODIUM 75 MCG PO TABS: 75.0000 ug | ORAL_TABLET | Freq: Every day | ORAL | 2 refills | Status: DC

## 2020-08-29 NOTE — Progress Notes (Signed)
Benjamin Bryan - 86 y.o. male MRN [id number]  Date of birth: Jun 01, 1928  Subjective Chief Complaint  Patient presents with  . Medication Refill    HPI Benjamin Bryan is a 86 y.o. male here today for follow up of HTN and Hypothyroidism.  He states that overall he is doing well.  He is due for updated labs.   His BP has been elevated the past couple of days.  He goes through short periods where his BP is elevated for a couple of days and then returns to normal.  He denies any recent dietary changes.  Increases in medication have been attempted previously however he developed orthostasis with this.  He has not had chest pain, shortness of breath, headache or vision changes.   He denies symptoms related to thyroid.  Weight has been stable.   ROS:  A comprehensive ROS was completed and negative except as noted per HPI  No Known Allergies  Past Medical History:  Diagnosis Date  . Atherosclerosis of aortic bifurcation and common iliac arteries ([place]) 01/26/2018   Seen on xray 01/26/18. Normal ABI study 2018  . COPD (chronic obstructive pulmonary disease) ([place]) 10/03/2015  . Frequent PVCs 09/11/2015  . History of prostate cancer 10/03/2015   Radical prostectomy    . History of pulmonary embolus (PE) 10/07/2018   Late October 2019 small clot burden  . History of TIA (transient ischemic attack) 09/11/2015  . Hyperlipidemia   . Hypertension   . Hypertensive kidney disease with stage 3 chronic kidney disease 09/11/2015  . Hypertensive urgency 08/30/2019  . Hypothyroidism 09/11/2015  . Iron deficiency anemia 12/09/2018  . Neuralgia, neuritis, and radiculitis, unspecified 03/20/2014  . Neurologic orthostatic hypotension ([place]) 09/12/2014  . Shingles   . Thyroid disease     Past Surgical History:  Procedure Laterality Date  . radical prostectomy  1994  . removed section of intestine  08/24/2005   polyp    Social History   Socioeconomic History  . Marital status: Married    Spouse name: Romie Minus   . Number of children: 1  . Years of education: 72  . Highest education level: 12th grade  Occupational History  . Occupation: retired    Comment: Mudlogger  Tobacco Use  . Smoking status: Former Smoker    Quit date: 12/11/1984    Years since quitting: 35.7  . Smokeless tobacco: Never Used  Vaping Use  . Vaping Use: Never used  Substance and Sexual Activity  . Alcohol use: Not Currently  . Drug use: No  . Sexual activity: Not Currently    Partners: Female  Other Topics Concern  . Not on file  Social History Narrative   Drinks caffeine during the day- coffee. Will go walking in neighborhood when the weather is decent.   Social Determinants of Health   Financial Resource Strain:   . Difficulty of Paying Living Expenses: Not on file  Food Insecurity:   . Worried About Charity fundraiser in the Last Year: Not on file  . Ran Out of Food in the Last Year: Not on file  Transportation Needs:   . Lack of Transportation (Medical): Not on file  . Lack of Transportation (Non-Medical): Not on file  Physical Activity:   . Days of Exercise per Week: Not on file  . Minutes of Exercise per Session: Not on file  Stress:   . Feeling of Stress : Not on file  Social Connections:   . Frequency of Communication with  Friends and Family: Not on file  . Frequency of Social Gatherings with Friends and Family: Not on file  . Attends Religious Services: Not on file  . Active Member of Clubs or Organizations: Not on file  . Attends Archivist Meetings: Not on file  . Marital Status: Not on file    Family History  Problem Relation Age of Onset  . Prostate cancer Father   . Stroke Father     Health Maintenance  Topic Date Due  . TETANUS/TDAP  01/27/2028  . INFLUENZA VACCINE  Completed  . COVID-19 Vaccine  Completed  . PNA vac Low Risk Adult  Completed      ----------------------------------------------------------------------------------------------------------------------------------------------------------------------------------------------------------------- Physical Exam BP (!) 182/84 (BP Location: Left Arm, Patient Position: Sitting, Cuff Size: Normal)   Pulse 69   Temp 97.9 F (36.6 C)   Ht 5' 4.96" (1.65 m)   Wt 145 lb 11.2 oz (66.1 kg)   SpO2 100%   BMI 24.27 kg/m   Physical Exam Constitutional:      Appearance: Normal appearance.  HENT:     Head: Normocephalic and atraumatic.  Eyes:     General: No scleral icterus. Cardiovascular:     Rate and Rhythm: Normal rate and regular rhythm.  Pulmonary:     Effort: Pulmonary effort is normal.     Breath sounds: Normal breath sounds.  Neurological:     General: No focal deficit present.     Mental Status: He is alert.  Psychiatric:        Mood and Affect: Mood normal.        Behavior: Behavior normal.     ------------------------------------------------------------------------------------------------------------------------------------------------------------------------------------------------------------------- Assessment and Plan  Hypothyroidism Due for updated TSH, ordered today.  Levothyroxine renewed.   Essential hypertension BP elevated today in clinic.  He goes through episodes where BP is elevated for 1-2 days and then returns to normal.  Has not tolerated additional medication.  He will continue to monitor readings at home and let me know if these remain elevated.    Meds ordered this encounter  Medications  . levothyroxine (EUTHYROX) 75 MCG tablet    Sig: Take 1 tablet (75 mcg total) by mouth daily.    Dispense:  90 tablet    Refill:  2    Return in about 6 months (around 02/26/2021) for HTN/Hypothyroid.    This visit occurred during the SARS-CoV-2 public health emergency.  Safety protocols were in place, including screening questions prior to  the visit, additional usage of staff PPE, and extensive cleaning of exam room while observing appropriate contact time as indicated for disinfecting solutions.

## 2020-08-29 NOTE — Assessment & Plan Note (Signed)
Due for updated TSH, ordered today.  Levothyroxine renewed.

## 2020-08-29 NOTE — Patient Instructions (Signed)
Nice to see you today! Have labs completed.  Continue current medications.  Give me a call next week and let me know what BP are looking like at home.  See me again in 6 months.

## 2020-08-29 NOTE — Assessment & Plan Note (Signed)
BP elevated today in clinic.  He goes through episodes where BP is elevated for 1-2 days and then returns to normal.  Has not tolerated additional medication.  He will continue to monitor readings at home and let me know if these remain elevated.

## 2020-08-30 LAB — COMPLETE METABOLIC PANEL WITH GFR
AG Ratio: 1.6 (calc) (ref 1.0–2.5)
ALT: 12 U/L (ref 9–46)
AST: 13 U/L (ref 10–35)
Albumin: 4.1 g/dL (ref 3.6–5.1)
Alkaline phosphatase (APISO): 55 U/L (ref 35–144)
BUN/Creatinine Ratio: 11 (calc) (ref 6–22)
BUN: 16 mg/dL (ref 7–25)
CO2: 28 mmol/L (ref 20–32)
Calcium: 9.5 mg/dL (ref 8.6–10.3)
Chloride: 102 mmol/L (ref 98–110)
Creat: 1.51 mg/dL — ABNORMAL HIGH (ref 0.70–1.11)
GFR, Est African American: 47 mL/min/{1.73_m2} — ABNORMAL LOW (ref >=60)
GFR, Est Non African American: 40 mL/min/{1.73_m2} — ABNORMAL LOW (ref >=60)
Globulin: 2.5 g/dL (calc) (ref 1.9–3.7)
Glucose, Bld: 108 mg/dL (ref 65–139)
Potassium: 5.1 mmol/L (ref 3.5–5.3)
Sodium: 136 mmol/L (ref 135–146)
Total Bilirubin: 0.8 mg/dL (ref 0.2–1.2)
Total Protein: 6.6 g/dL (ref 6.1–8.1)

## 2020-08-30 LAB — LIPID PANEL
Cholesterol: 130 mg/dL (ref <200)
HDL: 50 mg/dL (ref >=40)
LDL Cholesterol (Calc): 62 mg/dL (calc)
Non-HDL Cholesterol (Calc): 80 mg/dL (calc) (ref <130)
Total CHOL/HDL Ratio: 2.6 (calc) (ref <5.0)
Triglycerides: 95 mg/dL (ref <150)

## 2020-08-30 LAB — TSH: TSH: 3.88 mIU/L (ref 0.40–4.50)

## 2020-09-05 ENCOUNTER — Telehealth: Payer: Self-pay

## 2020-09-05 DIAGNOSIS — H04123 Dry eye syndrome of bilateral lacrimal glands: Secondary | ICD-10-CM | POA: Diagnosis not present

## 2020-09-05 DIAGNOSIS — H0289 Other specified disorders of eyelid: Secondary | ICD-10-CM | POA: Diagnosis not present

## 2020-09-05 NOTE — Telephone Encounter (Signed)
Benjamin Bryan called with BP monitor readings since last OV.  Date BP   Pulse 9/24    151/74    92 bpm 9/25    144/71    87 9/26    151/82    83 9/27    168/88    88 9/28    119/66    83 9/29    181/85    78 9/30    174/83    83

## 2020-09-05 NOTE — Telephone Encounter (Signed)
Patient has been advised

## 2020-09-05 NOTE — Telephone Encounter (Signed)
Continue to monitor, send in readings again in 2-3 weeks.

## 2020-11-04 DIAGNOSIS — B029 Zoster without complications: Secondary | ICD-10-CM | POA: Insufficient documentation

## 2020-11-04 DIAGNOSIS — I10 Essential (primary) hypertension: Secondary | ICD-10-CM | POA: Insufficient documentation

## 2020-11-04 DIAGNOSIS — E079 Disorder of thyroid, unspecified: Secondary | ICD-10-CM | POA: Insufficient documentation

## 2020-11-05 ENCOUNTER — Ambulatory Visit: Payer: Medicare Other | Admitting: Cardiology

## 2020-11-05 ENCOUNTER — Encounter: Payer: Self-pay | Admitting: Cardiology

## 2020-11-05 ENCOUNTER — Other Ambulatory Visit: Payer: Self-pay

## 2020-11-05 VITALS — BP 128/88 | HR 60 | Ht 65.0 in | Wt 146.0 lb

## 2020-11-05 DIAGNOSIS — E782 Mixed hyperlipidemia: Secondary | ICD-10-CM

## 2020-11-05 DIAGNOSIS — I493 Ventricular premature depolarization: Secondary | ICD-10-CM | POA: Diagnosis not present

## 2020-11-05 DIAGNOSIS — Z8673 Personal history of transient ischemic attack (TIA), and cerebral infarction without residual deficits: Secondary | ICD-10-CM

## 2020-11-05 DIAGNOSIS — N183 Chronic kidney disease, stage 3 unspecified: Secondary | ICD-10-CM | POA: Diagnosis not present

## 2020-11-05 DIAGNOSIS — I129 Hypertensive chronic kidney disease with stage 1 through stage 4 chronic kidney disease, or unspecified chronic kidney disease: Secondary | ICD-10-CM | POA: Diagnosis not present

## 2020-11-05 NOTE — Progress Notes (Signed)
Cardiology Office Note:    Date:  11/05/2020   ID:  [name], DOB 04-Aug-1928, MRN [id number]  PCP:  Luetta Nutting, DO  Cardiologist:  Berniece Salines, DO  Electrophysiologist:  None   Referring MD: Luetta Nutting, DO   I am doing fine  History of Present Illness:    Benjamin Bryan is a 86 y.o. male with a hx of hypertension, hyperlipidemia, peripheral artery disease, history of TIA, chronic kidney disease stage III and frequent PVCs here today for follow-up visit.  The patient is here today with his wife and he offers no complaints at this time.   Past Medical History:  Diagnosis Date  . Atherosclerosis of aortic bifurcation and common iliac arteries ([place]) 01/26/2018   Seen on xray 01/26/18. Normal ABI study 2018  . COPD (chronic obstructive pulmonary disease) ([place]) 10/03/2015  . Frequent PVCs 09/11/2015  . History of prostate cancer 10/03/2015   Radical prostectomy    . History of pulmonary embolus ([place]) 10/07/2018   Late October 2019 small clot burden  . History of TIA (transient ischemic attack) 09/11/2015  . Hyperlipidemia   . Hypertension   . Hypertensive kidney disease with stage 3 chronic kidney disease ([place]) 09/11/2015  . Hypertensive urgency 08/30/2019  . Hypothyroidism 09/11/2015  . Iron deficiency anemia 12/09/2018  . Neuralgia, neuritis, and radiculitis, unspecified 03/20/2014  . Neurologic orthostatic hypotension ([place]) 09/12/2014  . Shingles   . Thyroid disease     Past Surgical History:  Procedure Laterality Date  . radical prostectomy  1994  . removed section of intestine  08/24/2005   polyp    Current Medications: Current Meds  Medication Sig  . atorvastatin (LIPITOR) 10 MG tablet Take 1 tablet by mouth once daily  . carvedilol (COREG) 3.125 MG tablet Take 1 tablet by mouth twice daily  . famotidine (PEPCID) 20 MG tablet Take 20 mg by mouth 2 (two) times daily.  Marland Kitchen ipratropium (ATROVENT) 0.06 % nasal spray Place 2 sprays into both nostrils every 4 (four)  hours as needed.     Allergies:   Patient has no known allergies.   Social History   Socioeconomic History  . Marital status: Married    Spouse name: Romie Minus  . Number of children: 1  . Years of education: 79  . Highest education level: 12th grade  Occupational History  . Occupation: retired    Comment: Mudlogger  Tobacco Use  . Smoking status: Former Smoker    Quit date: 12/11/1984    Years since quitting: 35.9  . Smokeless tobacco: Never Used  Vaping Use  . Vaping Use: Never used  Substance and Sexual Activity  . Alcohol use: Not Currently  . Drug use: No  . Sexual activity: Not Currently    Partners: Female  Other Topics Concern  . Not on file  Social History Narrative   Drinks caffeine during the day- coffee. Will go walking in neighborhood when the weather is decent.   Social Determinants of Health   Financial Resource Strain:   . Difficulty of Paying Living Expenses: Not on file  Food Insecurity:   . Worried About Charity fundraiser in the Last Year: Not on file  . Ran Out of Food in the Last Year: Not on file  Transportation Needs:   . Lack of Transportation (Medical): Not on file  . Lack of Transportation (Non-Medical): Not on file  Physical Activity:   . Days of Exercise per Week: Not on file  .  Minutes of Exercise per Session: Not on file  Stress:   . Feeling of Stress : Not on file  Social Connections:   . Frequency of Communication with Friends and Family: Not on file  . Frequency of Social Gatherings with Friends and Family: Not on file  . Attends Religious Services: Not on file  . Active Member of Clubs or Organizations: Not on file  . Attends Archivist Meetings: Not on file  . Marital Status: Not on file     Family History: The patient's family history includes Prostate cancer in his father; Stroke in his father.  ROS:   Review of Systems  Constitution: Negative for decreased appetite, fever and weight gain.  HENT: Negative for  congestion, ear discharge, hoarse voice and sore throat.   Eyes: Negative for discharge, redness, vision loss in right eye and visual halos.  Cardiovascular: Negative for chest pain, dyspnea on exertion, leg swelling, orthopnea and palpitations.  Respiratory: Negative for cough, hemoptysis, shortness of breath and snoring.   Endocrine: Negative for heat intolerance and polyphagia.  Hematologic/Lymphatic: Negative for bleeding problem. Does not bruise/bleed easily.  Skin: Negative for flushing, nail changes, rash and suspicious lesions.  Musculoskeletal: Negative for arthritis, joint pain, muscle cramps, myalgias, neck pain and stiffness.  Gastrointestinal: Negative for abdominal pain, bowel incontinence, diarrhea and excessive appetite.  Genitourinary: Negative for decreased libido, genital sores and incomplete emptying.  Neurological: Negative for brief paralysis, focal weakness, headaches and loss of balance.  Psychiatric/Behavioral: Negative for altered mental status, depression and suicidal ideas.  Allergic/Immunologic: Negative for HIV exposure and persistent infections.    EKGs/Labs/Other Studies Reviewed:    The following studies were reviewed today:   EKG:  The ekg ordered today demonstrates   Recent Labs: 08/29/2020: ALT 12; BUN 16; Creat 1.51; Potassium 5.1; Sodium 136; TSH 3.88  Recent Lipid Panel    Component Value Date/Time   CHOL 130 08/29/2020 1023   TRIG 95 08/29/2020 1023   HDL 50 08/29/2020 1023   CHOLHDL 2.6 08/29/2020 1023   VLDL 18 09/16/2016 0831   LDLCALC 62 08/29/2020 1023    Physical Exam:    VS:  BP 128/88   Pulse 60   Ht 5\' 5"  (1.651 m)   Wt 146 lb (66.2 kg)   SpO2 98%   BMI 24.30 kg/m     Wt Readings from Last 3 Encounters:  11/05/20 146 lb (66.2 kg)  08/29/20 145 lb 11.2 oz (66.1 kg)  08/14/20 144 lb 1.3 oz (65.4 kg)     GEN: Well nourished, well developed in no acute distress HEENT: Normal NECK: No JVD; No carotid bruits LYMPHATICS:  No lymphadenopathy CARDIAC: S1S2 noted,RRR, no murmurs, rubs, gallops RESPIRATORY:  Clear to auscultation without rales, wheezing or rhonchi  ABDOMEN: Soft, non-tender, non-distended, +bowel sounds, no guarding. EXTREMITIES: No edema, No cyanosis, no clubbing MUSCULOSKELETAL:  No deformity  SKIN: Warm and dry NEUROLOGIC:  Alert and oriented x 3, non-focal PSYCHIATRIC:  Normal affect, good insight  ASSESSMENT:    1. Frequent PVCs   2. History of TIA (transient ischemic attack)   3. Hypertensive kidney disease with stage 3 chronic kidney disease, unspecified whether stage 3a or 3b CKD ([place])   4. Mixed hyperlipidemia    PLAN:     He appears to be doing well from a cardiovascular standpoint. His dizziness has resolved. He gives me information about his blood pressure which seems to be controlled as well. I am going to continue patient's current  medication regimen with no changes.  The patient is in agreement with the above plan. The patient left the office in stable condition.  The patient will follow up in 12 months or sooner if needed.   Medication Adjustments/Labs and Tests Ordered: Current medicines are reviewed at length with the patient today.  Concerns regarding medicines are outlined above.  No orders of the defined types were placed in this encounter.  No orders of the defined types were placed in this encounter.   Patient Instructions  Medication Instructions:  Your physician recommends that you continue on your current medications as directed. Please refer to the Current Medication list given to you today.  *If you need a refill on your cardiac medications before your next appointment, please call your pharmacy*   Lab Work: NONE If you have labs (blood work) drawn today and your tests are completely normal, you will receive your results only by: Marland Kitchen MyChart Message (if you have MyChart) OR . A paper copy in the mail If you have any lab test that is abnormal or we  need to change your treatment, we will call you to review the results.   Testing/Procedures: NONE   Follow-Up: At [facility], you and your health needs are our priority.  As part of our continuing mission to provide you with exceptional heart care, we have created designated Provider Care Teams.  These Care Teams include your primary Cardiologist (physician) and Advanced Practice Providers (APPs -  Physician Assistants and Nurse Practitioners) who all work together to provide you with the care you need, when you need it.  We recommend signing up for the patient portal called "MyChart".  Sign up information is provided on this After Visit Summary.  MyChart is used to connect with patients for Virtual Visits (Telemedicine).  Patients are able to view lab/test results, encounter notes, upcoming appointments, etc.  Non-urgent messages can be sent to your provider as well.   To learn more about what you can do with MyChart, go to [email or web address].    Your next appointment:   6 month(s)  The format for your next appointment:   In Person  Provider:   Berniece Salines, DO   Other Instructions      Adopting a Healthy Lifestyle.  Know what a healthy weight is for you (roughly BMI <25) and aim to maintain this   Aim for 7+ servings of fruits and vegetables daily   65-80+ fluid ounces of water or unsweet tea for healthy kidneys   Limit to max 1 drink of alcohol per day; avoid smoking/tobacco   Limit animal fats in diet for cholesterol and heart health - choose grass fed whenever available   Avoid highly processed foods, and foods high in saturated/trans fats   Aim for low stress - take time to unwind and care for your mental health   Aim for 150 min of moderate intensity exercise weekly for heart health, and weights twice weekly for bone health   Aim for 7-9 hours of sleep daily   When it comes to diets, agreement about the perfect plan isnt easy to find, even among the  experts. Experts at the [employer] developed an idea known as the Healthy Eating Plate. Just imagine a plate divided into logical, healthy portions.   The emphasis is on diet quality:   Load up on vegetables and fruits - one-half of your plate: Aim for color and variety, and remember that potatoes dont count.  Go for whole grains - one-quarter of your plate: Whole wheat, barley, wheat berries, quinoa, oats, brown rice, and foods made with them. If you want pasta, go with whole wheat pasta.   Protein power - one-quarter of your plate: Fish, chicken, beans, and nuts are all healthy, versatile protein sources. Limit red meat.   The diet, however, does go beyond the plate, offering a few other suggestions.   Use healthy plant oils, such as olive, canola, soy, corn, sunflower and peanut. Check the labels, and avoid partially hydrogenated oil, which have unhealthy trans fats.   If youre thirsty, drink water. Coffee and tea are good in moderation, but skip sugary drinks and limit milk and dairy products to one or two daily servings.   The type of carbohydrate in the diet is more important than the amount. Some sources of carbohydrates, such as vegetables, fruits, whole grains, and beans-are healthier than others.   Finally, stay active  Signed, Berniece Salines, DO  11/05/2020 11:15 AM    [place]

## 2020-11-05 NOTE — Patient Instructions (Signed)

## 2020-11-18 DIAGNOSIS — L249 Irritant contact dermatitis, unspecified cause: Secondary | ICD-10-CM | POA: Diagnosis not present

## 2020-11-18 DIAGNOSIS — Z85828 Personal history of other malignant neoplasm of skin: Secondary | ICD-10-CM | POA: Diagnosis not present

## 2020-11-18 DIAGNOSIS — C44619 Basal cell carcinoma of skin of left upper limb, including shoulder: Secondary | ICD-10-CM | POA: Diagnosis not present

## 2020-11-18 DIAGNOSIS — L570 Actinic keratosis: Secondary | ICD-10-CM | POA: Diagnosis not present

## 2020-11-18 DIAGNOSIS — C4441 Basal cell carcinoma of skin of scalp and neck: Secondary | ICD-10-CM | POA: Diagnosis not present

## 2020-11-18 HISTORY — PX: SKIN CANCER EXCISION: SHX779

## 2020-12-04 ENCOUNTER — Other Ambulatory Visit: Payer: Self-pay

## 2020-12-04 ENCOUNTER — Ambulatory Visit (INDEPENDENT_AMBULATORY_CARE_PROVIDER_SITE_OTHER): Payer: Medicare Other | Admitting: Sports Medicine

## 2020-12-04 VITALS — BP 140/75 | HR 85 | Temp 98.0°F | Resp 18 | Ht 65.0 in | Wt 148.0 lb

## 2020-12-04 DIAGNOSIS — Z0000 Encounter for general adult medical examination without abnormal findings: Secondary | ICD-10-CM

## 2020-12-04 NOTE — Progress Notes (Signed)
Subjective:   Benjamin Bryan is a 86 y.o. male who presents in office for Medicare Annual/Subsequent preventive examination.  Review of Systems    Reports no pain today.  Cardiac Risk Factors include: advanced age (>71men, >26 women);dyslipidemia;male gender;hypertension     Objective:    Today's Vitals   12/04/20 0954  BP: 140/75  Pulse: 85  Resp: 18  Temp: 98 F (36.7 C)  SpO2: 99%  Weight: 148 lb (67.1 kg)  Height: 5\' 5"  (1.651 m)   Body mass index is 24.63 kg/m.  Advanced Directives 12/04/2020 12/04/2019 11/28/2018 09/11/2015  Does Patient Have a Medical Advance Directive? No No No No  Does patient want to make changes to medical advance directive? - No - Patient declined - -  Would patient like information on creating a medical advance directive? No - Patient declined No - Patient declined No - Patient declined No - patient declined information    Current Medications (verified) Outpatient Encounter Medications as of 12/04/2020  Medication Sig  . atorvastatin (LIPITOR) 10 MG tablet Take 1 tablet by mouth once daily  . carvedilol (COREG) 3.125 MG tablet Take 1 tablet by mouth twice daily  . famotidine (PEPCID) 20 MG tablet Take 20 mg by mouth 2 (two) times daily.  Marland Kitchen ipratropium (ATROVENT) 0.06 % nasal spray Place 2 sprays into both nostrils every 4 (four) hours as needed.  Marland Kitchen levothyroxine (EUTHYROX) 75 MCG tablet Take 1 tablet (75 mcg total) by mouth daily.   No facility-administered encounter medications on file as of 12/04/2020.    Allergies (verified) Patient has no known allergies.   History: Past Medical History:  Diagnosis Date  . Atherosclerosis of aortic bifurcation and common iliac arteries ([place]) 01/26/2018   Seen on xray 01/26/18. Normal ABI study 2018  . COPD (chronic obstructive pulmonary disease) ([place]) 10/03/2015  . Frequent PVCs 09/11/2015  . History of prostate cancer 10/03/2015   Radical prostectomy    . History of pulmonary embolus (PE)  10/07/2018   Late October 2019 small clot burden  . History of TIA (transient ischemic attack) 09/11/2015  . Hyperlipidemia   . Hypertension   . Hypertensive kidney disease with stage 3 chronic kidney disease ([place]) 09/11/2015  . Hypertensive urgency 08/30/2019  . Hypothyroidism 09/11/2015  . Iron deficiency anemia 12/09/2018  . Neuralgia, neuritis, and radiculitis, unspecified 03/20/2014  . Neurologic orthostatic hypotension ([place]) 09/12/2014  . Shingles   . Thyroid disease    Past Surgical History:  Procedure Laterality Date  . radical prostectomy  1994  . removed section of intestine  08/24/2005   polyp  . SKIN CANCER EXCISION Left    from the patient; done at Dr. Darliss Ridgel office; december 2021   Family History  Problem Relation Age of Onset  . Prostate cancer Father   . Stroke Father    Social History   Socioeconomic History  . Marital status: Married    Spouse name: Romie Minus  . Number of children: 1  . Years of education: 32  . Highest education level: 12th grade  Occupational History  . Occupation: retired    Comment: Mudlogger  Tobacco Use  . Smoking status: Former Smoker    Quit date: 12/11/1984    Years since quitting: 36.0  . Smokeless tobacco: Never Used  Vaping Use  . Vaping Use: Never used  Substance and Sexual Activity  . Alcohol use: Not Currently  . Drug use: No  . Sexual activity: Not Currently    Partners: Female  Other Topics Concern  . Not on file  Social History Narrative   Drinks caffeine during the day- coffee. Will go walking in neighborhood when the weather is decent.   Social Determinants of Health   Financial Resource Strain: Low Risk   . Difficulty of Paying Living Expenses: Not hard at all  Food Insecurity: No Food Insecurity  . Worried About Charity fundraiser in the Last Year: Never true  . Ran Out of Food in the Last Year: Never true  Transportation Needs: No Transportation Needs  . Lack of Transportation (Medical): No  . Lack of  Transportation (Non-Medical): No  Physical Activity: Inactive  . Days of Exercise per Week: 0 days  . Minutes of Exercise per Session: 0 min  Stress: No Stress Concern Present  . Feeling of Stress : Not at all  Social Connections: Moderately Isolated  . Frequency of Communication with Friends and Family: Twice a week  . Frequency of Social Gatherings with Friends and Family: Once a week  . Attends Religious Services: Never  . Active Member of Clubs or Organizations: No  . Attends Archivist Meetings: Never  . Marital Status: Married    Tobacco Counseling Counseling given: Not Answered   Clinical Intake:  Pre-visit preparation completed: Yes  Pain : No/denies pain     BMI - recorded: 24.3 Nutritional Status: BMI of 19-24  Normal Nutritional Risks: None Diabetes: No  How often do you need to have someone help you when you read instructions, pamphlets, or other written materials from your doctor or pharmacy?: 1 - Never What is the last grade level you completed in school?: 12  Diabetic?No  Interpreter Needed?: No  Information entered by :: Tinnie Gens, RN   Activities of Daily Living In your present state of health, do you have any difficulty performing the following activities: 12/04/2020  Hearing? Y  Comment does not wear hearing aids.  Vision? N  Difficulty concentrating or making decisions? N  Walking or climbing stairs? N  Dressing or bathing? N  Doing errands, shopping? N  Preparing Food and eating ? N  Using the Toilet? N  In the past six months, have you accidently leaked urine? Y  Comment with activity  Do you have problems with loss of bowel control? N  Managing your Medications? N  Managing your Finances? N  Housekeeping or managing your Housekeeping? N  Some recent data might be hidden    Patient Care Team: Luetta Nutting, DO as PCP - General (Family Medicine) Berniece Salines, DO as PCP - Cardiology (Cardiology)  Indicate any recent  Medical Services you may have received from other than [facility] providers in the past year (date may be approximate).     Assessment:   This is a routine wellness examination for [place].  Hearing/Vision screen No exam data present  Dietary issues and exercise activities discussed: Current Exercise Habits: The patient does not participate in regular exercise at present, Exercise limited by: None identified  Goals    . Exercise 3x per week (30 min per time)     Increase exercise during the week.    . Patient Stated     Patient stated wants to get back into more exercising and improve his dizziness.    . Patient Stated     12/04/2020 AWV Goal: Exercise for General Health   Patient will verbalize understanding of the benefits of increased physical activity:  Exercising regularly is important. It will improve your overall  fitness, flexibility, and endurance.  Regular exercise also will improve your overall health. It can help you control your weight, reduce stress, and improve your bone density.  Over the next year, patient will increase physical activity as tolerated with a goal of at least 150 minutes of moderate physical activity per week.   You can tell that you are exercising at a moderate intensity if your heart starts beating faster and you start breathing faster but can still hold a conversation.  Moderate-intensity exercise ideas include:  Walking 1 mile (1.6 km) in about 15 minutes  Biking  Hiking  Golfing  Dancing  Water aerobics  Patient will verbalize understanding of everyday activities that increase physical activity by providing examples like the following: ? Yard work, such as: ? Pushing a Conservation officer, nature ? Raking and bagging leaves ? Washing your car ? Pushing a stroller ? Shoveling snow ? Gardening ? Washing windows or floors  Patient will be able to explain general safety guidelines for exercising:   Before you start a new exercise program, talk with  your health care provider.  Do not exercise so much that you hurt yourself, feel dizzy, or get very short of breath.  Wear comfortable clothes and wear shoes with good support.  Drink plenty of water while you exercise to prevent dehydration or heat stroke.  Work out until your breathing and your heartbeat get faster.       Depression Screen PHQ 2/9 Scores 12/04/2020 12/04/2019 11/28/2018 07/26/2018 03/22/2017  PHQ - 2 Score 0 0 0 0 0  PHQ- 9 Score 0 - - - -    Fall Risk Fall Risk  12/04/2020 12/04/2019 04/07/2019 11/28/2018 07/26/2018  Falls in the past year? 0 0 0 0 No  Number falls in past yr: 0 0 0 - -  Injury with Fall? 0 0 0 - -  Risk for fall due to : No Fall Risks - - - -  Follow up Falls evaluation completed Falls prevention discussed Falls evaluation completed - -    FALL RISK PREVENTION PERTAINING TO THE HOME:  Any stairs in or around the home? No  If so, are there any without handrails? No  Home free of loose throw rugs in walkways, pet beds, electrical cords, etc? Yes  Adequate lighting in your home to reduce risk of falls? Yes   Cognitive Function:     6CIT Screen 12/04/2020 12/04/2019 11/28/2018  What Year? 0 points 0 points 0 points  What month? 0 points 0 points 0 points  What time? 0 points 0 points 0 points  Count back from 20 0 points 0 points 0 points  Months in reverse 0 points 0 points 0 points  Repeat phrase 4 points 2 points 2 points  Total Score 4 2 2     Immunizations Immunization History  Administered Date(s) Administered  . Influenza, High Dose Seasonal PF 09/14/2017, 08/29/2018, 08/08/2019  . Influenza-Unspecified 08/27/2015, 09/06/2016, 08/10/2020  . Moderna Sars-Covid-2 Vaccination 07/11/2020, 08/10/2020  . Pneumococcal Conjugate-13 09/16/2016  . Pneumococcal Polysaccharide-23 01/26/2018  . Tdap 01/26/2018  . Zoster Recombinat (Shingrix) 12/11/2019, 02/23/2020    TDAP status: Up to date  Flu Vaccine status: Up to  date  Pneumococcal vaccine status: Up to date  Covid-19 vaccine status: Information provided on how to obtain vaccines.  Patient has had the first two doses of the Moderna vaccine but refused the booster. Stated that he will not be taking the booster as of now.  Qualifies for Shingles Vaccine?  Yes   Zostavax completed Yes   Shingrix Completed?: Yes  Screening Tests Health Maintenance  Topic Date Due  . COVID-19 Vaccine (3 - Moderna risk 4-dose series) 12/20/2020 (Originally 09/07/2020)  . TETANUS/TDAP  01/27/2028  . INFLUENZA VACCINE  Completed  . PNA vac Low Risk Adult  Completed    Health Maintenance  There are no preventive care reminders to display for this patient.  Colorectal cancer screening: No longer required.   Lung Cancer Screening: (Low Dose CT Chest recommended if Age 90-80 years, 30 pack-year currently smoking OR have quit w/in 15years.) does not qualify.   Lung Cancer Screening Referral: No  Additional Screening:  Hepatitis C Screening: does not qualify; Completed No  Vision Screening: Recommended annual ophthalmology exams for early detection of glaucoma and other disorders of the eye. Is the patient up to date with their annual eye exam?  Yes  Who is the provider or what is the name of the office in which the patient attends annual eye exams? Dr. Felicity Pellegrini If pt is not established with a provider, would they like to be referred to a provider to establish care? No .   Dental Screening: Recommended annual dental exams for proper oral hygiene  Formal Hearing Evaluation : Patient is hard of hearing in both ears. No hearing evaluation has been done but has been recommended.  Community Resource Referral / Chronic Care Management: CRR required this visit?  No   CCM required this visit?  No      Plan:     I have personally reviewed and noted the following in the patient's chart:   . Medical and social history . Use of alcohol, tobacco or illicit drugs   . Current medications and supplements . Functional ability and status . Nutritional status . Physical activity . Advanced directives . List of other physicians . Hospitalizations, surgeries, and ER visits in previous 12 months . Vitals . Screenings to include cognitive, depression, and falls . Referrals and appointments  In addition, I have reviewed and discussed with patient certain preventive protocols, quality metrics, and best practice recommendations. A written personalized care plan for preventive services as well as general preventive health recommendations were provided to patient.     Modesto Charon, RN   12/04/2020

## 2020-12-04 NOTE — Patient Instructions (Signed)
[facility] Maintenance Summary and Written Plan of Care  [name] ,  Thank you for allowing me to perform your Medicare Annual Wellness Visit and for your ongoing commitment to your health.   Health Maintenance & Immunization History Health Maintenance  Topic Date Due  . COVID-19 Vaccine (3 - Moderna risk 4-dose series) 12/20/2020 (Originally 09/07/2020)  . TETANUS/TDAP  01/27/2028  . INFLUENZA VACCINE  Completed  . PNA vac Low Risk Adult  Completed   Immunization History  Administered Date(s) Administered  . Influenza, High Dose Seasonal PF 09/14/2017, 08/29/2018, 08/08/2019  . Influenza-Unspecified 08/27/2015, 09/06/2016, 08/10/2020  . Moderna Sars-Covid-2 Vaccination 07/11/2020, 08/10/2020  . Pneumococcal Conjugate-13 09/16/2016  . Pneumococcal Polysaccharide-23 01/26/2018  . Tdap 01/26/2018  . Zoster Recombinat (Shingrix) 12/11/2019, 02/23/2020    These are the patient goals that we discussed: Goals Addressed            This Visit's Progress   . Patient Stated       12/04/2020 AWV Goal: Exercise for General Health   Patient will verbalize understanding of the benefits of increased physical activity:  Exercising regularly is important. It will improve your overall fitness, flexibility, and endurance.  Regular exercise also will improve your overall health. It can help you control your weight, reduce stress, and improve your bone density.  Over the next year, patient will increase physical activity as tolerated with a goal of at least 150 minutes of moderate physical activity per week.   You can tell that you are exercising at a moderate intensity if your heart starts beating faster and you start breathing faster but can still hold a conversation.  Moderate-intensity exercise ideas include:  Walking 1 mile (1.6 km) in about 15 minutes  Biking  Hiking  Golfing  Dancing  Water aerobics  Patient will verbalize understanding of  everyday activities that increase physical activity by providing examples like the following: ? Yard work, such as: ? Pushing a Conservation officer, nature ? Raking and bagging leaves ? Washing your car ? Pushing a stroller ? Shoveling snow ? Gardening ? Washing windows or floors  Patient will be able to explain general safety guidelines for exercising:   Before you start a new exercise program, talk with your health care provider.  Do not exercise so much that you hurt yourself, feel dizzy, or get very short of breath.  Wear comfortable clothes and wear shoes with good support.  Drink plenty of water while you exercise to prevent dehydration or heat stroke.  Work out until your breathing and your heartbeat get faster.         This is a list of Health Maintenance Items that are overdue or due now: There are no preventive care reminders to display for this patient.   Orders/Referrals Placed Today: No orders of the defined types were placed in this encounter.  (Contact our referral department at [phone] if you have not spoken with someone about your referral appointment within the next 5 days)    Follow-up Plan Schedule your hearing exam.       Health Maintenance, Male Adopting a healthy lifestyle and getting preventive care are important in promoting health and wellness. Ask your health care provider about:  The right schedule for you to have regular tests and exams.  Things you can do on your own to prevent diseases and keep yourself healthy. What should I know about diet, weight, and exercise? Eat a healthy diet   Eat a diet  that includes plenty of vegetables, fruits, low-fat dairy products, and lean protein.  Do not eat a lot of foods that are high in solid fats, added sugars, or sodium. Maintain a healthy weight Body mass index (BMI) is a measurement that can be used to identify possible weight problems. It estimates body fat based on height and weight. Your health care  provider can help determine your BMI and help you achieve or maintain a healthy weight. Get regular exercise Get regular exercise. This is one of the most important things you can do for your health. Most adults should:  Exercise for at least 150 minutes each week. The exercise should increase your heart rate and make you sweat (moderate-intensity exercise).  Do strengthening exercises at least twice a week. This is in addition to the moderate-intensity exercise.  Spend less time sitting. Even light physical activity can be beneficial. Watch cholesterol and blood lipids Have your blood tested for lipids and cholesterol at 84 years of age, then have this test every 5 years. You may need to have your cholesterol levels checked more often if:  Your lipid or cholesterol levels are high.  You are older than 84 years of age.  You are at high risk for heart disease. What should I know about cancer screening? Many types of cancers can be detected early and may often be prevented. Depending on your health history and family history, you may need to have cancer screening at various ages. This may include screening for:  Colorectal cancer.  Prostate cancer.  Skin cancer.  Lung cancer. What should I know about heart disease, diabetes, and high blood pressure? Blood pressure and heart disease  High blood pressure causes heart disease and increases the risk of stroke. This is more likely to develop in people who have high blood pressure readings, are of African descent, or are overweight.  Talk with your health care provider about your target blood pressure readings.  Have your blood pressure checked: ? Every 3-5 years if you are 99-59 years of age. ? Every year if you are 76 years old or older.  If you are between the ages of 31 and 44 and are a current or former smoker, ask your health care provider if you should have a one-time screening for abdominal aortic aneurysm  (AAA). Diabetes Have regular diabetes screenings. This checks your fasting blood sugar level. Have the screening done:  Once every three years after age 12 if you are at a normal weight and have a low risk for diabetes.  More often and at a younger age if you are overweight or have a high risk for diabetes. What should I know about preventing infection? Hepatitis B If you have a higher risk for hepatitis B, you should be screened for this virus. Talk with your health care provider to find out if you are at risk for hepatitis B infection. Hepatitis C Blood testing is recommended for:  Everyone born from [birth year] through 1963.  Anyone with known risk factors for hepatitis C. Sexually transmitted infections (STIs)  You should be screened each year for STIs, including gonorrhea and chlamydia, if: ? You are sexually active and are younger than 84 years of age. ? You are older than 84 years of age and your health care provider tells you that you are at risk for this type of infection. ? Your sexual activity has changed since you were last screened, and you are at increased risk for chlamydia or gonorrhea. Ask  your health care provider if you are at risk.  Ask your health care provider about whether you are at high risk for HIV. Your health care provider may recommend a prescription medicine to help prevent HIV infection. If you choose to take medicine to prevent HIV, you should first get tested for HIV. You should then be tested every 3 months for as long as you are taking the medicine. Follow these instructions at home: Lifestyle  Do not use any products that contain nicotine or tobacco, such as cigarettes, e-cigarettes, and chewing tobacco. If you need help quitting, ask your health care provider.  Do not use street drugs.  Do not share needles.  Ask your health care provider for help if you need support or information about quitting drugs. Alcohol use  Do not drink alcohol if your health  care provider tells you not to drink.  If you drink alcohol: ? Limit how much you have to 0-2 drinks a day. ? Be aware of how much alcohol is in your drink. In the [place] one drink equals one 12 oz bottle of beer (355 mL), one 5 oz glass of wine (148 mL), or one 1 oz glass of hard liquor (44 mL). General instructions  Schedule regular health, dental, and eye exams.  Stay current with your vaccines.  Tell your health care provider if: ? You often feel depressed. ? You have ever been abused or do not feel safe at home. Summary  Adopting a healthy lifestyle and getting preventive care are important in promoting health and wellness.  Follow your health care provider's instructions about healthy diet, exercising, and getting tested or screened for diseases.  Follow your health care provider's instructions on monitoring your cholesterol and blood pressure. This information is not intended to replace advice given to you by your health care provider. Make sure you discuss any questions you have with your health care provider. Document Revised: 11/16/2018 Document Reviewed: 11/16/2018 Elsevier Patient Education  2020 [place] you for enrolling in [facility]. Please follow the instructions below to securely access your online medical record. MyChart allows you to send messages to your doctor, view your test results, manage appointments, and more.   How Do I Sign Up? 1. In your Internet browser, go to [employer] and enter [email or web address]. 2. Click on the Sign Up Now link in the Sign In box. You will see the New Member Sign Up page. 3. Enter your MyChart Access Code exactly as it appears below. You will not need to use this code after you've completed the sign-up process. If you do not sign up before the expiration date, you must request a new code.  MyChart Access Code: [email or web address] Expires: 12/20/2020 11:11 AM  4. Enter your Social Security Number  ([id number]) and Date of Birth (mm/dd/yyyy) as indicated and click Submit. You will be taken to the next sign-up page. 5. Create a MyChart ID. This will be your MyChart login ID and cannot be changed, so think of one that is secure and easy to remember. 6. Create a MyChart password. You can change your password at any time. 7. Enter your Password Reset Question and Answer. This can be used at a later time if you forget your password.  8. Enter your e-mail address. You will receive e-mail notification when new information is available in [place]. 9. Click Sign Up. You can now view your medical record.   Additional Information Remember, MyChart is NOT to be used for  urgent needs. For medical emergencies, dial 911.

## 2020-12-12 ENCOUNTER — Encounter: Payer: Self-pay | Admitting: Nurse Practitioner

## 2020-12-12 ENCOUNTER — Ambulatory Visit (INDEPENDENT_AMBULATORY_CARE_PROVIDER_SITE_OTHER): Payer: Medicare Other | Admitting: Nurse Practitioner

## 2020-12-12 ENCOUNTER — Other Ambulatory Visit: Payer: Self-pay

## 2020-12-12 VITALS — BP 156/79 | HR 69 | Temp 97.8°F | Ht 65.0 in | Wt 150.6 lb

## 2020-12-12 DIAGNOSIS — I10 Essential (primary) hypertension: Secondary | ICD-10-CM | POA: Diagnosis not present

## 2020-12-12 MED ORDER — AMBULATORY NON FORMULARY MEDICATION: 0 refills | Status: DC

## 2020-12-12 NOTE — Patient Instructions (Signed)
I have given you a prescription for a new blood pressure monitor. You can take this to the medical supply store and your insurance should cover the cost.   I want you to take your blood pressure every morning and every evening with your new monitor for the next week and write this down.  Please call the office and give [place] the numbers.  If it looks like your blood pressure is staying high at home, we will start the amlodipine back. If it looks like it is good the majority of the time, we will watch it for a bit longer.   It looks like you had some problems with your blood pressure dropping too low in the past and we don't want this to happen again.   Our goal is to have your blood pressures in the 140's- lower than this can cause problems the older we get.   The medical supply store in [place] is: [facility] [address] [place], [place] [address]  You can use any medical supply store you would like.

## 2020-12-12 NOTE — Progress Notes (Signed)
Acute Office Visit  Subjective:    Patient ID: Benjamin Bryan, male    DOB: 1928-04-12, 87 y.o.   MRN: [id number]  Chief Complaint  Patient presents with  . Hypertension    HPI Patient is in today for concerns with blood pressure at home.  He reports that his blood pressures have been "all over the place" over the past several weeks. He tells me some readings have been in the 110's and some as high as the upper 180's.   He has written out his blood pressure readings for me with multiple measurements each day, some serial in 5 minute increments. It tells me that his machine will often cut off in the middle of the readings and he will have to restart the machine.  He is currently taking cervedilol 3.125mg  twice day. Chart review reveals cardiology decreased the dose in September of last year due to dizziness.  He was on amlodipine 5mg  in the past, but this appears to have been stopped in Mabelle Mungin 2021 for unknown reasons.   He denies chest pain, dizziness, headaches, vision changes, palpitations.   He has some difficulty with memory and therefore information is obtained through chart review in addition to his verbalization.  Past Medical History:  Diagnosis Date  . Atherosclerosis of aortic bifurcation and common iliac arteries ([place]) 01/26/2018   Seen on xray 01/26/18. Normal ABI study 2018  . Cancer ([place])    Basal cell carcinoma 11/18/2020  . COPD (chronic obstructive pulmonary disease) ([place]) 10/03/2015  . Frequent PVCs 09/11/2015  . History of prostate cancer 10/03/2015   Radical prostectomy    . History of pulmonary embolus (PE) 10/07/2018   Late October 2019 small clot burden  . History of TIA (transient ischemic attack) 09/11/2015  . Hyperlipidemia   . Hypertension   . Hypertensive kidney disease with stage 3 chronic kidney disease ([place]) 09/11/2015  . Hypertensive urgency 08/30/2019  . Hypothyroidism 09/11/2015  . Iron deficiency anemia 12/09/2018  . Neuralgia, neuritis, and  radiculitis, unspecified 03/20/2014  . Neurologic orthostatic hypotension ([place]) 09/12/2014  . Shingles   . Thyroid disease     Past Surgical History:  Procedure Laterality Date  . radical prostectomy  1994  . removed section of intestine  08/24/2005   polyp  . SKIN CANCER EXCISION Left 11/18/2020   from the patient; done at Dr. Darliss Ridgel office; december 2021; removed from left arm and neck    Family History  Problem Relation Age of Onset  . Prostate cancer Father   . Stroke Father     Social History   Socioeconomic History  . Marital status: Married    Spouse name: Romie Minus  . Number of children: 1  . Years of education: 85  . Highest education level: 12th grade  Occupational History  . Occupation: retired    Comment: Mudlogger  Tobacco Use  . Smoking status: Former Smoker    Quit date: 12/11/1984    Years since quitting: 36.0  . Smokeless tobacco: Never Used  Vaping Use  . Vaping Use: Never used  Substance and Sexual Activity  . Alcohol use: Not Currently  . Drug use: No  . Sexual activity: Not Currently    Partners: Female  Other Topics Concern  . Not on file  Social History Narrative   Drinks caffeine during the day- coffee. Will go walking in neighborhood when the weather is decent.   Social Determinants of Health   Financial Resource Strain: Low Risk   .  Difficulty of Paying Living Expenses: Not hard at all  Food Insecurity: No Food Insecurity  . Worried About Charity fundraiser in the Last Year: Never true  . Ran Out of Food in the Last Year: Never true  Transportation Needs: No Transportation Needs  . Lack of Transportation (Medical): No  . Lack of Transportation (Non-Medical): No  Physical Activity: Inactive  . Days of Exercise per Week: 0 days  . Minutes of Exercise per Session: 0 min  Stress: No Stress Concern Present  . Feeling of Stress : Not at all  Social Connections: Moderately Isolated  . Frequency of Communication with Friends and Family:  Twice a week  . Frequency of Social Gatherings with Friends and Family: Once a week  . Attends Religious Services: Never  . Active Member of Clubs or Organizations: No  . Attends Archivist Meetings: Never  . Marital Status: Married  Human resources officer Violence: Not At Risk  . Fear of Current or Ex-Partner: No  . Emotionally Abused: No  . Physically Abused: No  . Sexually Abused: No    Outpatient Medications Prior to Visit  Medication Sig Dispense Refill  . atorvastatin (LIPITOR) 10 MG tablet Take 1 tablet by mouth once daily 90 tablet 2  . carvedilol (COREG) 3.125 MG tablet Take 1 tablet by mouth twice daily 180 tablet 1  . famotidine (PEPCID) 20 MG tablet Take 20 mg by mouth 2 (two) times daily.    Marland Kitchen ipratropium (ATROVENT) 0.06 % nasal spray Place 2 sprays into both nostrils every 4 (four) hours as needed. 10 mL 6  . levothyroxine (EUTHYROX) 75 MCG tablet Take 1 tablet (75 mcg total) by mouth daily. 90 tablet 2   No facility-administered medications prior to visit.    No Known Allergies  Review of Systems All review of systems negative except what is listed in the HPI     Objective:    Physical Exam Vitals and nursing note reviewed.  Constitutional:      Appearance: Normal appearance. He is normal weight.  HENT:     Head: Normocephalic.  Eyes:     Extraocular Movements: Extraocular movements intact.     Conjunctiva/sclera: Conjunctivae normal.     Pupils: Pupils are equal, round, and reactive to light.  Neck:     Vascular: No carotid bruit.  Cardiovascular:     Rate and Rhythm: Normal rate and regular rhythm.     Pulses: Normal pulses.     Heart sounds: Normal heart sounds. No murmur heard. No friction rub.  Pulmonary:     Effort: Pulmonary effort is normal.     Breath sounds: Normal breath sounds.  Abdominal:     General: Abdomen is flat. Bowel sounds are normal.     Palpations: Abdomen is soft.  Musculoskeletal:        General: Normal range of  motion.     Cervical back: Normal range of motion.     Right lower leg: No edema.     Left lower leg: No edema.  Skin:    General: Skin is warm and dry.     Capillary Refill: Capillary refill takes less than 2 seconds.  Neurological:     General: No focal deficit present.     Mental Status: He is alert. Mental status is at baseline.  Psychiatric:        Mood and Affect: Mood normal.        Behavior: Behavior normal.  Thought Content: Thought content normal.        Judgment: Judgment normal.     BP (!) 156/79   Pulse 69   Temp 97.8 F (36.6 C)   Ht 5\' 5"  (1.651 m)   Wt 150 lb 9.6 oz (68.3 kg)   SpO2 100%   BMI 25.06 kg/m  Wt Readings from Last 3 Encounters:  12/12/20 150 lb 9.6 oz (68.3 kg)  12/04/20 148 lb (67.1 kg)  11/05/20 146 lb (66.2 kg)    There are no preventive care reminders to display for this patient.  There are no preventive care reminders to display for this patient.   Lab Results  Component Value Date   TSH 3.88 08/29/2020   Lab Results  Component Value Date   WBC 7.4 10/18/2019   HGB 13.8 10/18/2019   HCT 42.5 10/18/2019   MCV 96 10/18/2019   PLT 318 10/18/2019   Lab Results  Component Value Date   NA 136 08/29/2020   K 5.1 08/29/2020   CO2 28 08/29/2020   GLUCOSE 108 08/29/2020   BUN 16 08/29/2020   CREATININE 1.51 (H) 08/29/2020   BILITOT 0.8 08/29/2020   ALKPHOS 70 08/30/2019   AST 13 08/29/2020   ALT 12 08/29/2020   PROT 6.6 08/29/2020   ALBUMIN 4.2 06/23/2017   CALCIUM 9.5 08/29/2020   Lab Results  Component Value Date   CHOL 130 08/29/2020   Lab Results  Component Value Date   HDL 50 08/29/2020   Lab Results  Component Value Date   LDLCALC 62 08/29/2020   Lab Results  Component Value Date   TRIG 95 08/29/2020   Lab Results  Component Value Date   CHOLHDL 2.6 08/29/2020   Lab Results  Component Value Date   HGBA1C 4.8 06/23/2017       Assessment & Plan:   1. Hypertension, unspecified type -  AMBULATORY NON FORMULARY MEDICATION; Medication Name: Blood Pressure cuff with monitor.  Dispense: 1 each; Refill: 0  Blood pressure is elevated in the office today, no warning signs or symptoms present at this time or reported at home that would warrant concern.  Chart review does reveal that as recent as September of last year patient was experiencing dizziness and orthostatic changes to blood pressure which required change in daily carvedilol dosing.  Given that his machine is shutting off at home and he is taking his blood pressure at multiple close intervals, it is unclear if these are true readings or if the machine is malfunctioning. Reported blood pressure readings were from 110 systolic to mid 180's systolic, with a significant variation in the readings only minutes apart.  Most recent note from cardiology states that provider OK with blood pressures on the elevated side due to his history of orthostatics, no direct number listed, but values at that visit were in the mid 140's, therefore will go off of that for decision making.  Prescription provided for patient for new at home blood pressure machine with instructions on how to monitor blood pressure. No changes in medication today. He is not having any PVC's and HR is regulated well.  Request that patient monitor blood pressure twice a day for the next week and follow-up with me with telephone or in person visit to discuss at home readings with new cuff to determine the average blood pressure readings.  Discussed with the patient that due to his history of orthostatic hypotension and age, tight blood pressure control with readings less than  120 are not recommended.  If blood pressure is consistently above 145/90 at home over the next week, will consider adding back amlodipine at 2.5mg  dose with very close monitoring.  He will need frequent follow-up to ensure that the dose is effective without causing additional symptoms.     Follow-up in 1  week for blood pressure monitoring and review of at home readings.  Orma Render, NP

## 2020-12-26 ENCOUNTER — Telehealth: Payer: Self-pay

## 2020-12-26 ENCOUNTER — Encounter: Payer: Self-pay | Admitting: Family Medicine

## 2020-12-26 DIAGNOSIS — N183 Chronic kidney disease, stage 3 unspecified: Secondary | ICD-10-CM | POA: Insufficient documentation

## 2020-12-26 NOTE — Telephone Encounter (Signed)
Advised patient to take Carvedilol 6.25mg  BID per Dr. Madilyn Fireman.  Patient is going to double his pills twice a day for correct dosage because he recently received refills.   Patient advised to continue keeping BP log and bring it to Nurse visit scheduled for 01/23/21

## 2020-12-26 NOTE — Telephone Encounter (Signed)
Patient called to give report on blood pressure readings:  1/9   750a   169/98    88bpm  5p 179/73 1/10 735a 160/93    91  551p 174/91    81 1/11 817a 145/77    92  6p 169/81    77 1/13 830a 172/98    89  420p 196/89    82 1/14 745a 154/88    95  Pm 179/86    77 1/15 745a 180/75    83 1/16 755a 193/70    80  530p 172/80    81 1/17 830a 160/91    97  6p 147/72    82  Due to Clarise Cruz Early being "out of clinic" forwarded information to Dr. Madilyn Fireman.

## 2020-12-27 NOTE — Telephone Encounter (Signed)
Agree with documentation as above.   Calogero Geisen, MD  

## 2021-01-23 ENCOUNTER — Other Ambulatory Visit: Payer: Self-pay

## 2021-01-23 ENCOUNTER — Ambulatory Visit (INDEPENDENT_AMBULATORY_CARE_PROVIDER_SITE_OTHER): Payer: Medicare Other | Admitting: Family Medicine

## 2021-01-23 DIAGNOSIS — I10 Essential (primary) hypertension: Secondary | ICD-10-CM | POA: Diagnosis not present

## 2021-01-23 MED ORDER — AMLODIPINE BESYLATE 5 MG PO TABS: 5.0000 mg | ORAL_TABLET | Freq: Every day | ORAL | 3 refills | Status: DC

## 2021-01-23 NOTE — Progress Notes (Signed)
Medical screening examination/treatment was performed by qualified clinical staff member and as supervising physician I was immediately available for consultation/collaboration. I have reviewed documentation and agree with assessment and plan.  Updated rx for amlodipine sent in.   Luetta Nutting, DO

## 2021-01-23 NOTE — Progress Notes (Signed)
Pt's first BP check was 157/94.  The 10 min recheck was 172/82. Verified with pt that he IS taking the Carvedilol 6.25mg . Spoke with PCP and was told to find out if pt was able to tolerate Amlodipine in the past.  Verified with pt that that there were no odd side effects and they said there was not.  I was told that cardiology changed him from Amlodipine to Carvedilol. Advised pt that Dr. Zigmund Daniel will add back the Amodipine and for them to make another nurse visit in 2 weeks for BP check.

## 2021-01-30 DIAGNOSIS — H02035 Senile entropion of left lower eyelid: Secondary | ICD-10-CM | POA: Diagnosis not present

## 2021-01-30 DIAGNOSIS — H02032 Senile entropion of right lower eyelid: Secondary | ICD-10-CM | POA: Diagnosis not present

## 2021-01-30 DIAGNOSIS — H04123 Dry eye syndrome of bilateral lacrimal glands: Secondary | ICD-10-CM | POA: Diagnosis not present

## 2021-02-06 ENCOUNTER — Ambulatory Visit (INDEPENDENT_AMBULATORY_CARE_PROVIDER_SITE_OTHER): Payer: Medicare Other | Admitting: Family Medicine

## 2021-02-06 ENCOUNTER — Other Ambulatory Visit: Payer: Self-pay

## 2021-02-06 VITALS — BP 165/61 | HR 65 | Temp 97.8°F | Resp 20 | Ht 65.0 in | Wt 150.6 lb

## 2021-02-06 DIAGNOSIS — I10 Essential (primary) hypertension: Secondary | ICD-10-CM

## 2021-02-06 NOTE — Progress Notes (Signed)
Pt here today for a BP check. First reading 158/73, second reading 165/61. Dr. Zigmund Daniel notified.   Pt brought at home BP readings: 02/06/22 - 151/82  86 02/05/22 - 135/72  88 02/04/22 - 159/87  88 02/03/22 - 154/80 91 02/02/22 - 112/70 89 01/31/22 - 156/89 81   Established Patient Office Visit  Subjective:  Patient ID: Benjamin Bryan, male    DOB: 1928-04-09  Age: 87 y.o. MRN: [id number]  CC:  Chief Complaint  Patient presents with  . Hypertension    HPI Benjamin Bryan presents for a BP check.   Past Medical History:  Diagnosis Date  . Atherosclerosis of aortic bifurcation and common iliac arteries ([place]) 01/26/2018   Seen on xray 01/26/18. Normal ABI study 2018  . Cancer ([place])    Basal cell carcinoma 11/18/2020  . COPD (chronic obstructive pulmonary disease) ([place]) 10/03/2015  . Frequent PVCs 09/11/2015  . History of prostate cancer 10/03/2015   Radical prostectomy    . History of pulmonary embolus (PE) 10/07/2018   Late October 2019 small clot burden  . History of TIA (transient ischemic attack) 09/11/2015  . Hyperlipidemia   . Hypertension   . Hypertensive kidney disease with stage 3 chronic kidney disease ([place]) 09/11/2015  . Hypertensive urgency 08/30/2019  . Hypothyroidism 09/11/2015  . Iron deficiency anemia 12/09/2018  . Neuralgia, neuritis, and radiculitis, unspecified 03/20/2014  . Neurologic orthostatic hypotension ([place]) 09/12/2014  . Shingles   . Thyroid disease     Past Surgical History:  Procedure Laterality Date  . radical prostectomy  1994  . removed section of intestine  08/24/2005   polyp  . SKIN CANCER EXCISION Left 11/18/2020   from the patient; done at Dr. Darliss Ridgel office; december 2021; removed from left arm and neck    Family History  Problem Relation Age of Onset  . Prostate cancer Father   . Stroke Father     Social History   Socioeconomic History  . Marital status: Married    Spouse name: Romie Minus  . Number of children: 1  . Years of education: 62   . Highest education level: 12th grade  Occupational History  . Occupation: retired    Comment: Mudlogger  Tobacco Use  . Smoking status: Former Smoker    Quit date: 12/11/1984    Years since quitting: 36.1  . Smokeless tobacco: Never Used  Vaping Use  . Vaping Use: Never used  Substance and Sexual Activity  . Alcohol use: Not Currently  . Drug use: No  . Sexual activity: Not Currently    Partners: Female  Other Topics Concern  . Not on file  Social History Narrative   Drinks caffeine during the day- coffee. Will go walking in neighborhood when the weather is decent.   Social Determinants of Health   Financial Resource Strain: Low Risk   . Difficulty of Paying Living Expenses: Not hard at all  Food Insecurity: No Food Insecurity  . Worried About Charity fundraiser in the Last Year: Never true  . Ran Out of Food in the Last Year: Never true  Transportation Needs: No Transportation Needs  . Lack of Transportation (Medical): No  . Lack of Transportation (Non-Medical): No  Physical Activity: Inactive  . Days of Exercise per Week: 0 days  . Minutes of Exercise per Session: 0 min  Stress: No Stress Concern Present  . Feeling of Stress : Not at all  Social Connections: Moderately Isolated  . Frequency of Communication with  Friends and Family: Twice a week  . Frequency of Social Gatherings with Friends and Family: Once a week  . Attends Religious Services: Never  . Active Member of Clubs or Organizations: No  . Attends Archivist Meetings: Never  . Marital Status: Married  Human resources officer Violence: Not At Risk  . Fear of Current or Ex-Partner: No  . Emotionally Abused: No  . Physically Abused: No  . Sexually Abused: No    Outpatient Medications Prior to Visit  Medication Sig Dispense Refill  . AMBULATORY NON FORMULARY MEDICATION Medication Name: Blood Pressure cuff with monitor. 1 each 0  . amLODipine (NORVASC) 5 MG tablet Take 1 tablet (5 mg total) by mouth  daily. 90 tablet 3  . atorvastatin (LIPITOR) 10 MG tablet Take 1 tablet by mouth once daily 90 tablet 2  . carvedilol (COREG) 3.125 MG tablet Take 1 tablet by mouth twice daily 180 tablet 1  . famotidine (PEPCID) 20 MG tablet Take 20 mg by mouth 2 (two) times daily.    Marland Kitchen ipratropium (ATROVENT) 0.06 % nasal spray Place 2 sprays into both nostrils every 4 (four) hours as needed. 10 mL 6  . ketoconazole (NIZORAL) 2 % shampoo Apply topically 2 (two) times a week.    . levothyroxine (EUTHYROX) 75 MCG tablet Take 1 tablet (75 mcg total) by mouth daily. 90 tablet 2   No facility-administered medications prior to visit.    No Known Allergies  ROS Review of Systems    Objective:    Physical Exam  BP (!) 165/61 (BP Location: Left Arm, Patient Position: Sitting, Cuff Size: Normal)   Pulse 65   Temp 97.8 F (36.6 C) (Temporal)   Resp 20   Ht 5\' 5"  (1.651 m)   Wt 150 lb 9.6 oz (68.3 kg)   SpO2 100%   BMI 25.06 kg/m  Wt Readings from Last 3 Encounters:  02/06/21 150 lb 9.6 oz (68.3 kg)  12/12/20 150 lb 9.6 oz (68.3 kg)  12/04/20 148 lb (67.1 kg)     Health Maintenance Due  Topic Date Due  . COVID-19 Vaccine (3 - Moderna risk 4-dose series) 09/07/2020    There are no preventive care reminders to display for this patient.  Lab Results  Component Value Date   TSH 3.88 08/29/2020   Lab Results  Component Value Date   WBC 7.4 10/18/2019   HGB 13.8 10/18/2019   HCT 42.5 10/18/2019   MCV 96 10/18/2019   PLT 318 10/18/2019   Lab Results  Component Value Date   NA 136 08/29/2020   K 5.1 08/29/2020   CO2 28 08/29/2020   GLUCOSE 108 08/29/2020   BUN 16 08/29/2020   CREATININE 1.51 (H) 08/29/2020   BILITOT 0.8 08/29/2020   ALKPHOS 70 08/30/2019   AST 13 08/29/2020   ALT 12 08/29/2020   PROT 6.6 08/29/2020   ALBUMIN 4.2 06/23/2017   CALCIUM 9.5 08/29/2020   Lab Results  Component Value Date   CHOL 130 08/29/2020   Lab Results  Component Value Date   HDL 50  08/29/2020   Lab Results  Component Value Date   LDLCALC 62 08/29/2020   Lab Results  Component Value Date   TRIG 95 08/29/2020   Lab Results  Component Value Date   CHOLHDL 2.6 08/29/2020   Lab Results  Component Value Date   HGBA1C 4.8 06/23/2017      Assessment & Plan:  Follow up in 2-3 months for hypertension.  Problem  List Items Addressed This Visit   None     No orders of the defined types were placed in this encounter.   Follow-up: Return in about 2 months (around 04/08/2021).    Ninfa Meeker, CMA

## 2021-02-06 NOTE — Progress Notes (Signed)
Medical screening examination/treatment was performed by qualified clinical staff member and as supervising physician I was immediately available for consultation/collaboration. I have reviewed documentation and agree with assessment and plan.  Eliaz Fout, DO  

## 2021-02-19 ENCOUNTER — Telehealth: Payer: Self-pay | Admitting: Family Medicine

## 2021-02-19 ENCOUNTER — Other Ambulatory Visit: Payer: Self-pay | Admitting: Cardiology

## 2021-02-19 NOTE — Telephone Encounter (Signed)
Patient dropped off recent BP readings. Paper left in PCP box. On 02/19/21. AM

## 2021-03-03 ENCOUNTER — Telehealth: Payer: Self-pay | Admitting: Cardiology

## 2021-03-03 ENCOUNTER — Other Ambulatory Visit: Payer: Self-pay | Admitting: Cardiology

## 2021-03-03 NOTE — Telephone Encounter (Signed)
Pt c/o medication issue:  1. Name of Medication: carvedilol (COREG) 3.125 MG tablet  2. How are you currently taking this medication (dosage and times per day)? prescription changed  3. Are you having a reaction (difficulty breathing--STAT)? no  4. What is your medication issue? Patient's wife states the patient was takes 6 mg dose of the medication, but Dr. Harriet Masson changed it to 3 mg. She states his PCP Dr. Gilford Rile put him back on the 6 mg. She states he needs a refill of the 6 mg.

## 2021-03-03 NOTE — Telephone Encounter (Signed)
Spoke with patient's wife, she verbalized understanding and had no questions at this time.

## 2021-03-07 MED ORDER — CARVEDILOL 3.125 MG PO TABS: 3.1250 mg | ORAL_TABLET | Freq: Two times a day (BID) | ORAL | 1 refills | Status: DC

## 2021-03-07 NOTE — Telephone Encounter (Signed)
Patient is following up regarding the initial refill request. He states he is completely out of medication.  *STAT* If patient is at the pharmacy, call can be transferred to refill team.   1. Which medications need to be refilled? (please list name of each medication and dose if known) carvedilol (COREG) 3.125 MG tablet  2. Which pharmacy/location (including street and city if local pharmacy) is medication to be sent to? [facility], [place]  3. Do they need a 30 day or 90 day supply? 90 day supply

## 2021-03-07 NOTE — Telephone Encounter (Signed)
Refill sent to pharmacy.   

## 2021-03-20 ENCOUNTER — Telehealth: Payer: Self-pay

## 2021-03-20 NOTE — Telephone Encounter (Signed)
Pt dropped off his blood pressure reading for Benjamin Bryan. I placed it in the message box-tvt

## 2021-03-31 ENCOUNTER — Telehealth: Payer: Self-pay

## 2021-03-31 NOTE — Telephone Encounter (Signed)
Pt dropped off his blood pressure reading for Larra Crunkleton. I placed it in the message box-tvt 

## 2021-04-04 NOTE — Telephone Encounter (Signed)
Readings reviewed. Continue routine checks and we'll review at visit in June

## 2021-04-04 NOTE — Telephone Encounter (Signed)
Benjamin Bryan has been advised and agrees to June follow-up

## 2021-04-23 ENCOUNTER — Telehealth: Payer: Self-pay | Admitting: Family Medicine

## 2021-04-23 NOTE — Chronic Care Management (AMB) (Signed)
  Chronic Care Management   Outreach Note  04/23/2021 Name: Benjamin Bryan MRN: [id number] DOB: 1928/06/25  Referred by: Luetta Nutting, DO Reason for referral : No chief complaint on file.   An unsuccessful telephone outreach was attempted today. The patient was referred to the pharmacist for assistance with care management and care coordination.   Follow Up Plan:   Lauretta Grill Upstream Scheduler

## 2021-04-29 ENCOUNTER — Ambulatory Visit: Payer: Medicare Other | Admitting: Cardiology

## 2021-04-29 ENCOUNTER — Encounter: Payer: Self-pay | Admitting: Cardiology

## 2021-04-29 ENCOUNTER — Other Ambulatory Visit: Payer: Self-pay

## 2021-04-29 VITALS — BP 134/84 | HR 58 | Ht 65.0 in | Wt 150.0 lb

## 2021-04-29 DIAGNOSIS — N1831 Chronic kidney disease, stage 3a: Secondary | ICD-10-CM | POA: Diagnosis not present

## 2021-04-29 DIAGNOSIS — I10 Essential (primary) hypertension: Secondary | ICD-10-CM | POA: Diagnosis not present

## 2021-04-29 DIAGNOSIS — I493 Ventricular premature depolarization: Secondary | ICD-10-CM | POA: Diagnosis not present

## 2021-04-29 DIAGNOSIS — E782 Mixed hyperlipidemia: Secondary | ICD-10-CM | POA: Diagnosis not present

## 2021-04-29 NOTE — Patient Instructions (Signed)

## 2021-04-29 NOTE — Progress Notes (Signed)
Cardiology Office Note:    Date:  04/29/2021   ID:  [name], DOB 05-Apr-1928, MRN [id number]  PCP:  Luetta Nutting, DO  Cardiologist:  Berniece Salines, DO  Electrophysiologist:  None   Referring MD: Luetta Nutting, DO   Chief Complaint  Patient presents with  . Follow-up    History of Present Illness:    Benjamin Bryan is a 87 y.o. male with a hx of hypertension, hyperlipidemia, peripheral artery disease, history of TIA, chronic kidney disease stage III and frequent PVCs here today for follow-up visit.  The patient is here with his wife today.  He recently saw his PCP and due to the his blood pressure being elevated than normal he was amlodipine 5 mg added to his medication regimen.  He still get dizzy with head movement but it has improved some.  No other complaints at this time.  Past Medical History:  Diagnosis Date  . Atherosclerosis of aortic bifurcation and common iliac arteries ([place]) 01/26/2018   Seen on xray 01/26/18. Normal ABI study 2018  . Cancer ([place])    Basal cell carcinoma 11/18/2020  . COPD (chronic obstructive pulmonary disease) ([place]) 10/03/2015  . Frequent PVCs 09/11/2015  . History of prostate cancer 10/03/2015   Radical prostectomy    . History of pulmonary embolus (PE) 10/07/2018   Late October 2019 small clot burden  . History of TIA (transient ischemic attack) 09/11/2015  . Hyperlipidemia   . Hypertension   . Hypertensive kidney disease with stage 3 chronic kidney disease ([place]) 09/11/2015  . Hypertensive urgency 08/30/2019  . Hypothyroidism 09/11/2015  . Iron deficiency anemia 12/09/2018  . Neuralgia, neuritis, and radiculitis, unspecified 03/20/2014  . Neurologic orthostatic hypotension ([place]) 09/12/2014  . Shingles   . Thyroid disease     Past Surgical History:  Procedure Laterality Date  . radical prostectomy  1994  . removed section of intestine  08/24/2005   polyp  . SKIN CANCER EXCISION Left 11/18/2020   from the patient; done at Dr. Darliss Ridgel  office; december 2021; removed from left arm and neck    Current Medications: Current Meds  Medication Sig  . AMBULATORY NON FORMULARY MEDICATION Medication Name: Blood Pressure cuff with monitor.  Marland Kitchen amLODipine (NORVASC) 5 MG tablet Take 1 tablet (5 mg total) by mouth daily.  Marland Kitchen atorvastatin (LIPITOR) 10 MG tablet Take 1 tablet by mouth once daily  . carvedilol (COREG) 3.125 MG tablet Take 1 tablet (3.125 mg total) by mouth 2 (two) times daily.  . famotidine (PEPCID) 20 MG tablet Take 20 mg by mouth daily.  Marland Kitchen ipratropium (ATROVENT) 0.06 % nasal spray Place 2 sprays into both nostrils every 4 (four) hours as needed.  Marland Kitchen ketoconazole (NIZORAL) 2 % shampoo Apply topically 2 (two) times a week.  . levothyroxine (EUTHYROX) 75 MCG tablet Take 1 tablet (75 mcg total) by mouth daily.     Allergies:   Patient has no known allergies.   Social History   Socioeconomic History  . Marital status: Married    Spouse name: Romie Minus  . Number of children: 1  . Years of education: 82  . Highest education level: 12th grade  Occupational History  . Occupation: retired    Comment: Mudlogger  Tobacco Use  . Smoking status: Former Smoker    Quit date: 12/11/1984    Years since quitting: 36.4  . Smokeless tobacco: Never Used  Vaping Use  . Vaping Use: Never used  Substance and Sexual Activity  . Alcohol  use: Not Currently  . Drug use: No  . Sexual activity: Not Currently    Partners: Female  Other Topics Concern  . Not on file  Social History Narrative   Drinks caffeine during the day- coffee. Will go walking in neighborhood when the weather is decent.   Social Determinants of Health   Financial Resource Strain: Low Risk   . Difficulty of Paying Living Expenses: Not hard at all  Food Insecurity: No Food Insecurity  . Worried About Charity fundraiser in the Last Year: Never true  . Ran Out of Food in the Last Year: Never true  Transportation Needs: No Transportation Needs  . Lack of  Transportation (Medical): No  . Lack of Transportation (Non-Medical): No  Physical Activity: Inactive  . Days of Exercise per Week: 0 days  . Minutes of Exercise per Session: 0 min  Stress: No Stress Concern Present  . Feeling of Stress : Not at all  Social Connections: Moderately Isolated  . Frequency of Communication with Friends and Family: Twice a week  . Frequency of Social Gatherings with Friends and Family: Once a week  . Attends Religious Services: Never  . Active Member of Clubs or Organizations: No  . Attends Archivist Meetings: Never  . Marital Status: Married     Family History: The patient's family history includes Prostate cancer in his father; Stroke in his father.  ROS:   Review of Systems  Constitution: Negative for decreased appetite, fever and weight gain.  HENT: Negative for congestion, ear discharge, hoarse voice and sore throat.   Eyes: Negative for discharge, redness, vision loss in right eye and visual halos.  Cardiovascular: Negative for chest pain, dyspnea on exertion, leg swelling, orthopnea and palpitations.  Respiratory: Negative for cough, hemoptysis, shortness of breath and snoring.   Endocrine: Negative for heat intolerance and polyphagia.  Hematologic/Lymphatic: Negative for bleeding problem. Does not bruise/bleed easily.  Skin: Negative for flushing, nail changes, rash and suspicious lesions.  Musculoskeletal: Negative for arthritis, joint pain, muscle cramps, myalgias, neck pain and stiffness.  Gastrointestinal: Negative for abdominal pain, bowel incontinence, diarrhea and excessive appetite.  Genitourinary: Negative for decreased libido, genital sores and incomplete emptying.  Neurological: Negative for brief paralysis, focal weakness, headaches and loss of balance.  Psychiatric/Behavioral: Negative for altered mental status, depression and suicidal ideas.  Allergic/Immunologic: Negative for HIV exposure and persistent infections.     EKGs/Labs/Other Studies Reviewed:    The following studies were reviewed today:   EKG: None today  Recent Labs: 08/29/2020: ALT 12; BUN 16; Creat 1.51; Potassium 5.1; Sodium 136; TSH 3.88  Recent Lipid Panel    Component Value Date/Time   CHOL 130 08/29/2020 1023   TRIG 95 08/29/2020 1023   HDL 50 08/29/2020 1023   CHOLHDL 2.6 08/29/2020 1023   VLDL 18 09/16/2016 0831   LDLCALC 62 08/29/2020 1023    Physical Exam:    VS:  BP 134/84 (BP Location: Left Arm, Patient Position: Sitting, Cuff Size: Normal)   Pulse (!) 58   Ht 5\' 5"  (1.651 m)   Wt 150 lb (68 kg)   SpO2 98%   BMI 24.96 kg/m     Wt Readings from Last 3 Encounters:  04/29/21 150 lb (68 kg)  02/06/21 150 lb 9.6 oz (68.3 kg)  12/12/20 150 lb 9.6 oz (68.3 kg)     GEN: Well nourished, well developed in no acute distress HEENT: Normal NECK: No JVD; No carotid bruits LYMPHATICS: No  lymphadenopathy CARDIAC: S1S2 noted,RRR, no murmurs, rubs, gallops RESPIRATORY:  Clear to auscultation without rales, wheezing or rhonchi  ABDOMEN: Soft, non-tender, non-distended, +bowel sounds, no guarding. EXTREMITIES: No edema, No cyanosis, no clubbing MUSCULOSKELETAL:  No deformity  SKIN: Warm and dry NEUROLOGIC:  Alert and oriented x 3, non-focal PSYCHIATRIC:  Normal affect, good insight  ASSESSMENT:    1. Essential hypertension   2. Frequent PVCs   3. Stage 3a chronic kidney disease ([place])   4. Mixed hyperlipidemia    PLAN:      His blood pressure in the office today is acceptable.  He does not have his home blood pressure journal with him today.  He tells me that they are staying mostly in the 161W and 960A systolic. Continue his amlodipine 5 mg daily with his carvedilol 3.125 twice a day.  Hyperlipidemia - continue with current statin medication.  The patient is in agreement with the above plan. The patient left the office in stable condition.  The patient will follow up in 1 year or sooner if  needed.   Medication Adjustments/Labs and Tests Ordered: Current medicines are reviewed at length with the patient today.  Concerns regarding medicines are outlined above.  No orders of the defined types were placed in this encounter.  No orders of the defined types were placed in this encounter.   There are no Patient Instructions on file for this visit.   Adopting a Healthy Lifestyle.  Know what a healthy weight is for you (roughly BMI <25) and aim to maintain this   Aim for 7+ servings of fruits and vegetables daily   65-80+ fluid ounces of water or unsweet tea for healthy kidneys   Limit to max 1 drink of alcohol per day; avoid smoking/tobacco   Limit animal fats in diet for cholesterol and heart health - choose grass fed whenever available   Avoid highly processed foods, and foods high in saturated/trans fats   Aim for low stress - take time to unwind and care for your mental health   Aim for 150 min of moderate intensity exercise weekly for heart health, and weights twice weekly for bone health   Aim for 7-9 hours of sleep daily   When it comes to diets, agreement about the perfect plan isnt easy to find, even among the experts. Experts at the [employer] developed an idea known as the Healthy Eating Plate. Just imagine a plate divided into logical, healthy portions.   The emphasis is on diet quality:   Load up on vegetables and fruits - one-half of your plate: Aim for color and variety, and remember that potatoes dont count.   Go for whole grains - one-quarter of your plate: Whole wheat, barley, wheat berries, quinoa, oats, brown rice, and foods made with them. If you want pasta, go with whole wheat pasta.   Protein power - one-quarter of your plate: Fish, chicken, beans, and nuts are all healthy, versatile protein sources. Limit red meat.   The diet, however, does go beyond the plate, offering a few other suggestions.   Use healthy plant  oils, such as olive, canola, soy, corn, sunflower and peanut. Check the labels, and avoid partially hydrogenated oil, which have unhealthy trans fats.   If youre thirsty, drink water. Coffee and tea are good in moderation, but skip sugary drinks and limit milk and dairy products to one or two daily servings.   The type of carbohydrate in the diet is more  important than the amount. Some sources of carbohydrates, such as vegetables, fruits, whole grains, and beans-are healthier than others.   Finally, stay active  Signed, Berniece Salines, DO  04/29/2021 11:16 AM    [place]

## 2021-05-07 ENCOUNTER — Telehealth: Payer: Self-pay | Admitting: Family Medicine

## 2021-05-07 NOTE — Progress Notes (Signed)
  Chronic Care Management   Note  05/07/2021 Name: Benjamin Bryan MRN: [id number] DOB: 02-06-1928  Benjamin Bryan is a 87 y.o. year old male who is a primary care patient of Luetta Nutting, DO. I reached out to [name] by phone today in response to a referral sent by Benjamin Bryan's PCP, Luetta Nutting, DO.   Benjamin Bryan was given information about Chronic Care Management services today including:  1. CCM service includes personalized support from designated clinical staff supervised by his physician, including individualized plan of care and coordination with other care providers 2. 24/7 contact phone numbers for assistance for urgent and routine care needs. 3. Service will only be billed when office clinical staff spend 20 minutes or more in a month to coordinate care. 4. Only one practitioner may furnish and bill the service in a calendar month. 5. The patient may stop CCM services at any time (effective at the end of the month) by phone call to the office staff.   Patient did not agree to enrollment in care management services and does not wish to consider at this time.  Follow up plan:   Benjamin Bryan [employer] Scheduler

## 2021-05-09 ENCOUNTER — Ambulatory Visit (INDEPENDENT_AMBULATORY_CARE_PROVIDER_SITE_OTHER): Payer: Medicare Other | Admitting: Family Medicine

## 2021-05-09 ENCOUNTER — Other Ambulatory Visit: Payer: Self-pay

## 2021-05-09 ENCOUNTER — Encounter: Payer: Self-pay | Admitting: Family Medicine

## 2021-05-09 DIAGNOSIS — I700 Atherosclerosis of aorta: Secondary | ICD-10-CM | POA: Diagnosis not present

## 2021-05-09 DIAGNOSIS — E079 Disorder of thyroid, unspecified: Secondary | ICD-10-CM

## 2021-05-09 DIAGNOSIS — I10 Essential (primary) hypertension: Secondary | ICD-10-CM

## 2021-05-09 DIAGNOSIS — J449 Chronic obstructive pulmonary disease, unspecified: Secondary | ICD-10-CM | POA: Diagnosis not present

## 2021-05-09 DIAGNOSIS — I708 Atherosclerosis of other arteries: Secondary | ICD-10-CM | POA: Diagnosis not present

## 2021-05-09 NOTE — Patient Instructions (Signed)
Great to see you today! Continue current medications.  See me again in 6 months.  We'll check labs at this visit.

## 2021-05-11 NOTE — Progress Notes (Signed)
Benjamin Bryan - 87 y.o. male MRN [id number]  Date of birth: 1928-03-22  Subjective Chief Complaint  Patient presents with  . Hypertension    HPI Benjamin Bryan is a 87 y.o. male here today for follow up of HTN.  He is taking amlodipine 5mg  daily and coreg 3.125mg  BID.  No issues with tolerance of medication.  He is checking blood pressure at home with readings ranging from 110/60-160/80.  Most readings around around 130-140/70's.  Readings is elevated today however was normal at cardiology a few days ago.  He denies symptoms including chest pain, shortness of breath, palpitations, headache.   ROS:  A comprehensive ROS was completed and negative except as noted per HPI  No Known Allergies  Past Medical History:  Diagnosis Date  . Atherosclerosis of aortic bifurcation and common iliac arteries ([place]) 01/26/2018   Seen on xray 01/26/18. Normal ABI study 2018  . Cancer ([place])    Basal cell carcinoma 11/18/2020  . COPD (chronic obstructive pulmonary disease) ([place]) 10/03/2015  . Frequent PVCs 09/11/2015  . History of prostate cancer 10/03/2015   Radical prostectomy    . History of pulmonary embolus (PE) 10/07/2018   Late October 2019 small clot burden  . History of TIA (transient ischemic attack) 09/11/2015  . Hyperlipidemia   . Hypertension   . Hypertensive kidney disease with stage 3 chronic kidney disease ([place]) 09/11/2015  . Hypertensive urgency 08/30/2019  . Hypothyroidism 09/11/2015  . Iron deficiency anemia 12/09/2018  . Neuralgia, neuritis, and radiculitis, unspecified 03/20/2014  . Neurologic orthostatic hypotension ([place]) 09/12/2014  . Shingles   . Thyroid disease     Past Surgical History:  Procedure Laterality Date  . radical prostectomy  1994  . removed section of intestine  08/24/2005   polyp  . SKIN CANCER EXCISION Left 11/18/2020   from the patient; done at Dr. Darliss Ridgel office; december 2021; removed from left arm and neck    Social History   Socioeconomic History  .  Marital status: Married    Spouse name: Romie Minus  . Number of children: 1  . Years of education: 72  . Highest education level: 12th grade  Occupational History  . Occupation: retired    Comment: Mudlogger  Tobacco Use  . Smoking status: Former Smoker    Quit date: 12/11/1984    Years since quitting: 36.4  . Smokeless tobacco: Never Used  Vaping Use  . Vaping Use: Never used  Substance and Sexual Activity  . Alcohol use: Not Currently  . Drug use: No  . Sexual activity: Not Currently    Partners: Female  Other Topics Concern  . Not on file  Social History Narrative   Drinks caffeine during the day- coffee. Will go walking in neighborhood when the weather is decent.   Social Determinants of Health   Financial Resource Strain: Low Risk   . Difficulty of Paying Living Expenses: Not hard at all  Food Insecurity: No Food Insecurity  . Worried About Charity fundraiser in the Last Year: Never true  . Ran Out of Food in the Last Year: Never true  Transportation Needs: No Transportation Needs  . Lack of Transportation (Medical): No  . Lack of Transportation (Non-Medical): No  Physical Activity: Inactive  . Days of Exercise per Week: 0 days  . Minutes of Exercise per Session: 0 min  Stress: No Stress Concern Present  . Feeling of Stress : Not at all  Social Connections: Moderately Isolated  . Frequency  of Communication with Friends and Family: Twice a week  . Frequency of Social Gatherings with Friends and Family: Once a week  . Attends Religious Services: Never  . Active Member of Clubs or Organizations: No  . Attends Archivist Meetings: Never  . Marital Status: Married    Family History  Problem Relation Age of Onset  . Prostate cancer Father   . Stroke Father     Health Maintenance  Topic Date Due  . Pneumococcal Vaccine 45-9 Years old (1 of 4 - PCV13) Never done  . COVID-19 Vaccine (3 - Moderna risk 4-dose series) 09/07/2020  . INFLUENZA VACCINE  07/07/2021   . TETANUS/TDAP  01/27/2028  . PNA vac Low Risk Adult  Completed  . Zoster Vaccines- Shingrix  Completed  . HPV VACCINES  Aged Out     ----------------------------------------------------------------------------------------------------------------------------------------------------------------------------------------------------------------- Physical Exam BP (!) 157/73 (BP Location: Left Arm, Patient Position: Sitting, Cuff Size: Normal)   Pulse 71   Ht 5\' 5"  (1.651 m)   Wt 149 lb (67.6 kg)   SpO2 98%   BMI 24.79 kg/m   Physical Exam Constitutional:      Appearance: Normal appearance.  HENT:     Head: Normocephalic and atraumatic.  Eyes:     General: No scleral icterus. Cardiovascular:     Rate and Rhythm: Normal rate and regular rhythm.  Pulmonary:     Effort: Pulmonary effort is normal.     Breath sounds: Normal breath sounds.  Musculoskeletal:     Cervical back: Neck supple.  Neurological:     General: No focal deficit present.     Mental Status: He is alert.  Psychiatric:        Mood and Affect: Mood normal.        Behavior: Behavior normal.     ------------------------------------------------------------------------------------------------------------------------------------------------------------------------------------------------------------------- Assessment and Plan  Hypertension BP elevated here, however based on readings at home and recent reading at cardiology I would be hesitant to make adjustment and cause hypotension.  He will continue to monitor at home.    Atherosclerosis of aortic bifurcation and common iliac arteries (HCC) Normal abi previously.  Denies claudication symptoms.   Thyroid disease Update TSH at next visit.   COPD (chronic obstructive pulmonary disease) (HCC) Mild, no treatment needed at this time.    No orders of the defined types were placed in this encounter.   No follow-ups on file.    This visit occurred during the  SARS-CoV-2 public health emergency.  Safety protocols were in place, including screening questions prior to the visit, additional usage of staff PPE, and extensive cleaning of exam room while observing appropriate contact time as indicated for disinfecting solutions.

## 2021-05-11 NOTE — Assessment & Plan Note (Signed)
Normal abi previously.  Denies claudication symptoms.

## 2021-05-11 NOTE — Assessment & Plan Note (Signed)
Update TSH at next visit.

## 2021-05-11 NOTE — Assessment & Plan Note (Signed)
BP elevated here, however based on readings at home and recent reading at cardiology I would be hesitant to make adjustment and cause hypotension.  He will continue to monitor at home.

## 2021-05-11 NOTE — Assessment & Plan Note (Signed)
Mild, no treatment needed at this time.

## 2021-05-12 DIAGNOSIS — L820 Inflamed seborrheic keratosis: Secondary | ICD-10-CM | POA: Diagnosis not present

## 2021-05-12 DIAGNOSIS — D224 Melanocytic nevi of scalp and neck: Secondary | ICD-10-CM | POA: Diagnosis not present

## 2021-05-12 DIAGNOSIS — D485 Neoplasm of uncertain behavior of skin: Secondary | ICD-10-CM | POA: Diagnosis not present

## 2021-05-12 DIAGNOSIS — L218 Other seborrheic dermatitis: Secondary | ICD-10-CM | POA: Diagnosis not present

## 2021-05-12 DIAGNOSIS — L578 Other skin changes due to chronic exposure to nonionizing radiation: Secondary | ICD-10-CM | POA: Diagnosis not present

## 2021-05-12 DIAGNOSIS — L570 Actinic keratosis: Secondary | ICD-10-CM | POA: Diagnosis not present

## 2021-05-12 DIAGNOSIS — Z85828 Personal history of other malignant neoplasm of skin: Secondary | ICD-10-CM | POA: Diagnosis not present

## 2021-05-20 ENCOUNTER — Other Ambulatory Visit: Payer: Self-pay | Admitting: Family Medicine

## 2021-06-05 ENCOUNTER — Other Ambulatory Visit: Payer: Self-pay | Admitting: Family Medicine

## 2021-07-31 DIAGNOSIS — H02035 Senile entropion of left lower eyelid: Secondary | ICD-10-CM | POA: Diagnosis not present

## 2021-07-31 DIAGNOSIS — H04123 Dry eye syndrome of bilateral lacrimal glands: Secondary | ICD-10-CM | POA: Diagnosis not present

## 2021-07-31 DIAGNOSIS — H02032 Senile entropion of right lower eyelid: Secondary | ICD-10-CM | POA: Diagnosis not present

## 2021-07-31 DIAGNOSIS — H00025 Hordeolum internum left lower eyelid: Secondary | ICD-10-CM | POA: Diagnosis not present

## 2021-08-13 ENCOUNTER — Other Ambulatory Visit: Payer: Self-pay | Admitting: Family Medicine

## 2021-08-19 ENCOUNTER — Telehealth: Payer: Self-pay | Admitting: Family Medicine

## 2021-08-19 ENCOUNTER — Other Ambulatory Visit: Payer: Self-pay | Admitting: Cardiology

## 2021-08-19 NOTE — Telephone Encounter (Signed)
Patient stopped by and is almost out of medication listed below and stated he knows a refill has been sent in but that stated that the pharmacy is waiting on an approval from [place], he spoke with them at lunch today. Please Advise.   [place] tablet   [facility], [place] Phone:  [phone]  Fax:  [phone]

## 2021-08-20 NOTE — Telephone Encounter (Signed)
Atorvastatin 10 mg # 90 x 3 refills sent to   [facility], [place]

## 2021-09-02 ENCOUNTER — Other Ambulatory Visit: Payer: Self-pay | Admitting: Cardiology

## 2021-09-02 NOTE — Telephone Encounter (Signed)
Carvedilol 3.125 mg # 180 only sent to  [facility], [place]

## 2021-11-10 ENCOUNTER — Encounter: Payer: Self-pay | Admitting: Family Medicine

## 2021-11-10 ENCOUNTER — Ambulatory Visit (INDEPENDENT_AMBULATORY_CARE_PROVIDER_SITE_OTHER): Payer: Medicare Other | Admitting: Family Medicine

## 2021-11-10 ENCOUNTER — Other Ambulatory Visit: Payer: Self-pay

## 2021-11-10 VITALS — BP 181/91 | HR 70 | Temp 97.8°F | Ht 65.0 in | Wt 149.0 lb

## 2021-11-10 DIAGNOSIS — E039 Hypothyroidism, unspecified: Secondary | ICD-10-CM

## 2021-11-10 DIAGNOSIS — N1831 Chronic kidney disease, stage 3a: Secondary | ICD-10-CM

## 2021-11-10 DIAGNOSIS — E782 Mixed hyperlipidemia: Secondary | ICD-10-CM | POA: Diagnosis not present

## 2021-11-10 DIAGNOSIS — I10 Essential (primary) hypertension: Secondary | ICD-10-CM

## 2021-11-10 MED ORDER — AMLODIPINE BESYLATE 10 MG PO TABS: 10.0000 mg | ORAL_TABLET | Freq: Every day | ORAL | 3 refills | Status: DC

## 2021-11-10 NOTE — Assessment & Plan Note (Signed)
Tolerating atorvastatin well at this time.  Update lipid panel.

## 2021-11-10 NOTE — Assessment & Plan Note (Signed)
Blood pressure elevated today.  Increase amlodipine to 10 mg daily.  Continue carvedilol at current strength.

## 2021-11-10 NOTE — Assessment & Plan Note (Signed)
Updating TSH today.  Continue levothyroxine at current strength.

## 2021-11-10 NOTE — Progress Notes (Signed)
Benjamin Bryan - 87 y.o. male MRN [id number]  Date of birth: November 29, 1928  Subjective Chief Complaint  Patient presents with   Hypertension    HPI Benjamin Bryan is a 87 year old male here today for follow-up of hypertension.  He reports he is doing well and feels well at this time.  Feels good at current dose of levothyroxine.  Blood pressure is elevated today.  Readings at home have been more elevated recently as well.  He denies any symptoms related to hypertension including chest pain, shortness of breath, palpitations, headache or vision changes.  Continues to tolerate atorvastatin well at current strength.  ROS:  A comprehensive ROS was completed and negative except as noted per HPI  No Known Allergies  Past Medical History:  Diagnosis Date   Atherosclerosis of aortic bifurcation and common iliac arteries ([place]) 01/26/2018   Seen on xray 01/26/18. Normal ABI study 2018   Cancer [facility])    Basal cell carcinoma 11/18/2020   COPD (chronic obstructive pulmonary disease) ([place]) 10/03/2015   Frequent PVCs 09/11/2015   History of prostate cancer 10/03/2015   Radical prostectomy     History of pulmonary embolus (PE) 10/07/2018   Late October 2019 small clot burden   History of TIA (transient ischemic attack) 09/11/2015   Hyperlipidemia    Hypertension    Hypertensive kidney disease with stage 3 chronic kidney disease ([place]) 09/11/2015   Hypertensive urgency 08/30/2019   Hypothyroidism 09/11/2015   Iron deficiency anemia 12/09/2018   Neuralgia, neuritis, and radiculitis, unspecified 03/20/2014   Neurologic orthostatic hypotension ([place]) 09/12/2014   Shingles    Thyroid disease     Past Surgical History:  Procedure Laterality Date   radical prostectomy  1994   removed section of intestine  08/24/2005   polyp   SKIN CANCER EXCISION Left 11/18/2020   from the patient; done at Dr. Darliss Ridgel office; december 2021; removed from left arm and neck    Social History   Socioeconomic History   Marital  status: Married    Spouse name: Benjamin Bryan   Number of children: 1   Years of education: 12   Highest education level: 12th grade  Occupational History   Occupation: retired    Comment: management  Tobacco Use   Smoking status: Former    Types: Cigarettes    Quit date: 12/11/1984    Years since quitting: 36.9   Smokeless tobacco: Never  Vaping Use   Vaping Use: Never used  Substance and Sexual Activity   Alcohol use: Not Currently   Drug use: No   Sexual activity: Not Currently    Partners: Female  Other Topics Concern   Not on file  Social History Narrative   Drinks caffeine during the day- coffee. Will go walking in neighborhood when the weather is decent.   Social Determinants of Health   Financial Resource Strain: Low Risk    Difficulty of Paying Living Expenses: Not hard at all  Food Insecurity: No Food Insecurity   Worried About Charity fundraiser in the Last Year: Never true   [place] in the Last Year: Never true  Transportation Needs: No Transportation Needs   Lack of Transportation (Medical): No   Lack of Transportation (Non-Medical): No  Physical Activity: Inactive   Days of Exercise per Week: 0 days   Minutes of Exercise per Session: 0 min  Stress: No Stress Concern Present   Feeling of Stress : Not at all  Social Connections: Moderately Isolated  Frequency of Communication with Friends and Family: Twice a week   Frequency of Social Gatherings with Friends and Family: Once a week   Attends Religious Services: Never   Marine scientist or Organizations: No   Attends Music therapist: Never   Marital Status: Married    Family History  Problem Relation Age of Onset   Prostate cancer Father    Stroke Father     Health Maintenance  Topic Date Due   COVID-19 Vaccine (3 - Moderna risk series) 09/07/2020   TETANUS/TDAP  01/27/2028   Pneumonia Vaccine 33+ Years old  Completed   INFLUENZA VACCINE  Completed   Zoster Vaccines- Shingrix   Completed   HPV VACCINES  Aged Out     ----------------------------------------------------------------------------------------------------------------------------------------------------------------------------------------------------------------- Physical Exam BP (!) 181/91 (BP Location: Left Arm, Patient Position: Sitting, Cuff Size: Normal)   Pulse 70   Temp 97.8 F (36.6 C)   Ht 5\' 5"  (1.651 m)   Wt 149 lb (67.6 kg)   SpO2 100%   BMI 24.79 kg/m   Physical Exam Constitutional:      Appearance: Normal appearance.  Eyes:     General: No scleral icterus. Cardiovascular:     Rate and Rhythm: Normal rate and regular rhythm.  Pulmonary:     Effort: Pulmonary effort is normal.     Breath sounds: Normal breath sounds.  Musculoskeletal:     Cervical back: Neck supple.  Neurological:     Mental Status: He is alert.  Psychiatric:        Mood and Affect: Mood normal.        Behavior: Behavior normal.    ------------------------------------------------------------------------------------------------------------------------------------------------------------------------------------------------------------------- Assessment and Plan  Essential hypertension Blood pressure elevated today.  Increase amlodipine to 10 mg daily.  Continue carvedilol at current strength.  Hypothyroidism Updating TSH today.  Continue levothyroxine at current strength.  CKD (chronic kidney disease) stage 3, GFR 30-59 ml/min (HCC) Update renal function.  Hyperlipidemia Tolerating atorvastatin well at this time.  Update lipid panel.   Meds ordered this encounter  Medications   amLODipine (NORVASC) 10 MG tablet    Sig: Take 1 tablet (10 mg total) by mouth daily.    Dispense:  90 tablet    Refill:  3    Return in about 6 months (around 05/11/2022) for HTN.    This visit occurred during the SARS-CoV-2 public health emergency.  Safety protocols were in place, including screening questions prior  to the visit, additional usage of staff PPE, and extensive cleaning of exam room while observing appropriate contact time as indicated for disinfecting solutions.

## 2021-11-10 NOTE — Assessment & Plan Note (Signed)
Update renal function.  

## 2021-11-10 NOTE — Patient Instructions (Addendum)
Increase Amlodipine to 10mg  daily.   Continue to check blood pressure at home. Drop off readings after 3-4 weeks.   See me again in about 6 months.

## 2021-11-11 LAB — LIPID PANEL W/REFLEX DIRECT LDL
Cholesterol: 134 mg/dL (ref <200)
HDL: 57 mg/dL (ref >=40)
LDL Cholesterol (Calc): 62 mg/dL (calc)
Non-HDL Cholesterol (Calc): 77 mg/dL (calc) (ref <130)
Total CHOL/HDL Ratio: 2.4 (calc) (ref <5.0)
Triglycerides: 74 mg/dL (ref <150)

## 2021-11-11 LAB — COMPLETE METABOLIC PANEL WITH GFR
AG Ratio: 1.8 (calc) (ref 1.0–2.5)
ALT: 10 U/L (ref 9–46)
AST: 13 U/L (ref 10–35)
Albumin: 4.1 g/dL (ref 3.6–5.1)
Alkaline phosphatase (APISO): 58 U/L (ref 35–144)
BUN/Creatinine Ratio: 14 (calc) (ref 6–22)
BUN: 20 mg/dL (ref 7–25)
CO2: 27 mmol/L (ref 20–32)
Calcium: 9.4 mg/dL (ref 8.6–10.3)
Chloride: 102 mmol/L (ref 98–110)
Creat: 1.39 mg/dL — ABNORMAL HIGH (ref 0.70–1.22)
Globulin: 2.3 g/dL (calc) (ref 1.9–3.7)
Glucose, Bld: 99 mg/dL (ref 65–99)
Potassium: 5.1 mmol/L (ref 3.5–5.3)
Sodium: 137 mmol/L (ref 135–146)
Total Bilirubin: 0.8 mg/dL (ref 0.2–1.2)
Total Protein: 6.4 g/dL (ref 6.1–8.1)
eGFR: 48 mL/min/{1.73_m2} — ABNORMAL LOW (ref >=60)

## 2021-11-11 LAB — CBC WITH DIFFERENTIAL/PLATELET
Absolute Monocytes: 849 cells/uL (ref 200–950)
Basophils Absolute: 62 cells/uL (ref 0–200)
Basophils Relative: 0.9 %
Eosinophils Absolute: 97 cells/uL (ref 15–500)
Eosinophils Relative: 1.4 %
HCT: 42.2 % (ref 38.5–50.0)
Hemoglobin: 14 g/dL (ref 13.2–17.1)
Lymphs Abs: 1104 cells/uL (ref 850–3900)
MCH: 32.1 pg (ref 27.0–33.0)
MCHC: 33.2 g/dL (ref 32.0–36.0)
MCV: 96.8 fL (ref 80.0–100.0)
MPV: 10.3 fL (ref 7.5–12.5)
Monocytes Relative: 12.3 %
Neutro Abs: 4789 cells/uL (ref 1500–7800)
Neutrophils Relative %: 69.4 %
Platelets: 301 10*3/uL (ref 140–400)
RBC: 4.36 10*6/uL (ref 4.20–5.80)
RDW: 11.8 % (ref 11.0–15.0)
Total Lymphocyte: 16 %
WBC: 6.9 10*3/uL (ref 3.8–10.8)

## 2021-11-11 LAB — TSH: TSH: 2.8 mIU/L (ref 0.40–4.50)

## 2021-11-12 ENCOUNTER — Other Ambulatory Visit: Payer: Self-pay

## 2021-11-12 MED ORDER — IPRATROPIUM BROMIDE 0.06 % NA SOLN: NASAL | 0 refills | Status: DC

## 2021-11-25 ENCOUNTER — Other Ambulatory Visit: Payer: Self-pay | Admitting: Family Medicine

## 2021-12-02 ENCOUNTER — Other Ambulatory Visit: Payer: Self-pay | Admitting: Cardiology

## 2021-12-09 ENCOUNTER — Encounter: Payer: Self-pay | Admitting: Emergency Medicine

## 2021-12-09 ENCOUNTER — Emergency Department
Admission: EM | Admit: 2021-12-09 | Discharge: 2021-12-09 | Disposition: A | Discharge status: Home / Self Care | Payer: Medicare Other | Source: Home / Self Care | Attending: Family Medicine | Admitting: Family Medicine

## 2021-12-09 DIAGNOSIS — I10 Essential (primary) hypertension: Secondary | ICD-10-CM | POA: Diagnosis not present

## 2021-12-09 NOTE — ED Provider Notes (Signed)
[facility]    CSN: [id number] Arrival date & time: 12/09/21  1147      History   Chief Complaint Chief Complaint  Patient presents with   Hypertension    HPI Benjamin Bryan is a 88 y.o. male.   HPI  Patient is here for elevated blood pressure.  He states that has been taking his blood pressure at home.  Its been running in the 200/100 range.  He thought his blood pressure cuff must be malfunctioning.  He took blood pressure cuff to his pharmacy.  The pharmacy blood pressure and his blood pressure were equivalent.  He has been feeling well. Both the nurse and myself reviewed his blood pressure medications and schedule with him.  He has been on more than one blood pressure medication, states his blood pressure was too high on some too low on others.  States that he stopped taking the amlodipine at one point because he thought he was making his blood pressure too low.  I am uncertain whether he is currently supposed to be taking 5 or 10 mg of amlodipine.  He still takes carvedilol 2 times a day. He denies any headaches, dizzy spells, visual symptoms, loss of balance.  To my evaluation he may be developing early memory impairment  Past Medical History:  Diagnosis Date   Atherosclerosis of aortic bifurcation and common iliac arteries ([place]) 01/26/2018   Seen on xray 01/26/18. Normal ABI study 2018   Cancer [facility])    Basal cell carcinoma 11/18/2020   COPD (chronic obstructive pulmonary disease) ([place]) 10/03/2015   Frequent PVCs 09/11/2015   History of prostate cancer 10/03/2015   Radical prostectomy     History of pulmonary embolus (PE) 10/07/2018   Late October 2019 small clot burden   History of TIA (transient ischemic attack) 09/11/2015   Hyperlipidemia    Hypertension    Hypertensive kidney disease with stage 3 chronic kidney disease ([place]) 09/11/2015   Hypertensive urgency 08/30/2019   Hypothyroidism 09/11/2015   Iron deficiency anemia 12/09/2018   Neuralgia, neuritis, and  radiculitis, unspecified 03/20/2014   Neurologic orthostatic hypotension ([place]) 09/12/2014   Shingles    Thyroid disease     Patient Active Problem List   Diagnosis Date Noted   CKD (chronic kidney disease) stage 3, GFR 30-59 ml/min ([place]) 12/26/2020   Thyroid disease    Stasis dermatitis 02/09/2020   SK (seborrheic keratosis) 02/09/2020   Iron deficiency anemia 12/09/2018   History of pulmonary embolus (PE) 10/07/2018   Multiple subsegmental pulmonary emboli without acute cor pulmonale (HCC) 10/07/2018   Atherosclerosis of aortic bifurcation and common iliac arteries ([place]) 01/26/2018   COPD (chronic obstructive pulmonary disease) ([place]) 10/03/2015   History of prostate cancer 10/03/2015   Hypothyroidism 09/11/2015   Essential hypertension 09/11/2015   Hyperlipidemia 09/11/2015   Frequent PVCs 09/11/2015   Hypertensive kidney disease with stage 3 chronic kidney disease ([place]) 09/11/2015   History of TIA (transient ischemic attack) 09/11/2015   Neurologic orthostatic hypotension ([place]) 09/12/2014   Neuralgia, neuritis, and radiculitis, unspecified 03/20/2014    Past Surgical History:  Procedure Laterality Date   radical prostectomy  1994   removed section of intestine  08/24/2005   polyp   SKIN CANCER EXCISION Left 11/18/2020   from the patient; done at Dr. Darliss Ridgel office; december 2021; removed from left arm and neck       Home Medications    Prior to Admission medications   Medication Sig Start Date End Date  Taking? Authorizing Provider  AMBULATORY NON FORMULARY MEDICATION Medication Name: Blood Pressure cuff with monitor. 12/12/20   Orma Render, NP  amLODipine (NORVASC) 10 MG tablet Take 1 tablet (10 mg total) by mouth daily. 11/10/21   Luetta Nutting, DO  atorvastatin (LIPITOR) 10 MG tablet Take 1 tablet by mouth once daily 08/20/21   Tobb, Kardie, DO  carvedilol (COREG) 3.125 MG tablet Take 1 tablet by mouth twice daily 12/03/21   Tobb, Kardie, DO  famotidine (PEPCID) 20 MG  tablet Take 20 mg by mouth daily. Patient not taking: Reported on 12/09/2021    [provider]  ipratropium (ATROVENT) 0.06 % nasal spray USE 2 SPRAY(S) IN EACH NOSTRIL EVERY 4 HOURS AS NEEDED 11/12/21   Luetta Nutting, DO  ketoconazole (NIZORAL) 2 % shampoo Apply topically 2 (two) times a week. 12/21/20   [provider]  levothyroxine (SYNTHROID) 75 MCG tablet Take 1 tablet by mouth once daily 11/25/21   Luetta Nutting, DO    Family History Family History  Problem Relation Age of Onset   Prostate cancer Father    Stroke Father     Social History Social History   Tobacco Use   Smoking status: Former    Types: Cigarettes    Quit date: 12/11/1984    Years since quitting: 37.0   Smokeless tobacco: Never  Vaping Use   Vaping Use: Never used  Substance Use Topics   Alcohol use: Not Currently   Drug use: No     Allergies   Patient has no known allergies.   Review of Systems Review of Systems See HPI  Physical Exam Triage Vital Signs ED Triage Vitals  Enc Vitals Group     BP 12/09/21 1213 (!) 200/90     Pulse Rate 12/09/21 1213 71     Resp 12/09/21 1213 16     Temp 12/09/21 1213 97.7 F (36.5 C)     Temp Source 12/09/21 1213 Oral     SpO2 12/09/21 1213 97 %     Weight 12/09/21 1214 150 lb (68 kg)     Height 12/09/21 1214 5\' 5"  (1.651 m)     Head Circumference --      Peak Flow --      Pain Score 12/09/21 1213 0     Pain Loc --      Pain Edu? --      Excl. in [place]? --    No data found.  Updated Vital Signs BP (!) 190/85 (BP Location: Left Arm)    Pulse 70    Temp 97.7 F (36.5 C) (Oral)    Resp 16    Ht 5\' 5"  (1.651 m)    Wt 68 kg    SpO2 97%    BMI 24.96 kg/m      Physical Exam Constitutional:      General: He is not in acute distress.    Appearance: Normal appearance. He is well-developed.     Comments: Hard of hearing.  Pleasant  HENT:     Head: Normocephalic and atraumatic.     Right Ear: Tympanic membrane and ear canal normal.      Left Ear: Tympanic membrane and ear canal normal.     Nose: Nose normal. No congestion.     Mouth/Throat:     Pharynx: No posterior oropharyngeal erythema.     Comments: Teeth in good repair Eyes:     Conjunctiva/sclera: Conjunctivae normal.     Pupils: Pupils are equal, round,  and reactive to light.  Cardiovascular:     Rate and Rhythm: Normal rate and regular rhythm.     Heart sounds: Normal heart sounds.     Comments: Frequent ectopy Pulmonary:     Effort: Pulmonary effort is normal. No respiratory distress.     Breath sounds: No wheezing or rales.  Abdominal:     General: There is no distension.     Palpations: Abdomen is soft.  Musculoskeletal:        General: Normal range of motion.     Cervical back: Normal range of motion.     Right lower leg: No edema.     Left lower leg: No edema.  Skin:    General: Skin is warm and dry.  Neurological:     Mental Status: He is alert.     Gait: Gait normal.     UC Treatments / Results  Labs (all labs ordered are listed, but only abnormal results are displayed) Labs Reviewed - No data to display  EKG   Radiology No results found.  Procedures Procedures (including critical care time)  Medications Ordered in UC Medications - No data to display  Initial Impression / Assessment and Plan / UC Course  I have reviewed the triage vital signs and the nursing notes.  Pertinent labs & imaging results that were available during my care of the patient were reviewed by me and considered in my medical decision making (see chart for details).     Elevated blood pressure.  I do not believe it is at a level that is immediately dangerous.  We sent him home to take his blood pressure medicine now.  I did write down a schedule for his medicines and gave him a blood pressure log sheet.  He will follow-up with Dr. Zigmund Daniel Final Clinical Impressions(s) / UC Diagnoses   Final diagnoses:  Primary hypertension     Discharge Instructions       Take blood pressure medicine every day as we discussed Keep track of blood pressure on a log sheet See Dr Zigmund Daniel in follow ip   ED Prescriptions   None    PDMP not reviewed this encounter.   Raylene Everts, MD 12/09/21 [phone]

## 2021-12-09 NOTE — Discharge Instructions (Addendum)
Take blood pressure medicine every day as we discussed Keep track of blood pressure on a log sheet See Dr Zigmund Daniel in follow ip

## 2021-12-09 NOTE — ED Triage Notes (Signed)
Pt was at the pharmacy today - BP has been elevated this past week -told to go to Urgent Care Pt is here w/ his wife  Pt took BP machine in to correlate w/ pharmacy BP cuff SBP has been 200 - 220 at home per pt  Denies chest pain or HA Pt was instructed by PCP to take an extra dose of BP meds at lunch time (TID)- pt is still taking BP meds at breakfast & bedtime

## 2022-01-02 ENCOUNTER — Ambulatory Visit (INDEPENDENT_AMBULATORY_CARE_PROVIDER_SITE_OTHER): Payer: Medicare Other | Admitting: Family Medicine

## 2022-01-02 ENCOUNTER — Other Ambulatory Visit: Payer: Self-pay

## 2022-01-02 VITALS — BP 144/77 | HR 73 | Ht 65.0 in | Wt 149.1 lb

## 2022-01-02 DIAGNOSIS — Z0000 Encounter for general adult medical examination without abnormal findings: Secondary | ICD-10-CM | POA: Diagnosis not present

## 2022-01-02 NOTE — Progress Notes (Signed)
MEDICARE ANNUAL WELLNESS VISIT  01/02/2022  Subjective:  Benjamin Bryan is a 88 y.o. male patient of Luetta Nutting, DO who had a Medicare Annual Wellness Visit today. Dugan is Retired and lives with their spouse. he has 1 child. he reports that he is socially active and does interact with friends/family regularly. he is minimally physically active and enjoys resting and sleeping.  Patient Care Team: Luetta Nutting, DO as PCP - General (Family Medicine) Berniece Salines, DO as PCP - Cardiology (Cardiology)  Advanced Directives 01/02/2022 12/04/2020 12/04/2019 11/28/2018 09/11/2015  Does Patient Have a Medical Advance Directive? No No No No No  Does patient want to make changes to medical advance directive? - - No - Patient declined - -  Would patient like information on creating a medical advance directive? No - Patient declined No - Patient declined No - Patient declined No - Patient declined No - patient declined information    Hospital Utilization Over the Past 12 Months: # of hospitalizations or ER visits: 1 # of surgeries: 0  Review of Systems    Patient reports that his overall health is unchanged when compared to last year.  Review of Systems: History obtained from chart review and the patient  All other systems negative.  Pain Assessment Pain : No/denies pain     Current Medications & Allergies (verified) Allergies as of 01/02/2022   No Known Allergies      Medication List        Accurate as of January 02, 2022  2:37 PM. If you have any questions, ask your nurse or doctor.          AMBULATORY NON FORMULARY MEDICATION Medication Name: Blood Pressure cuff with monitor.   amLODipine 10 MG tablet Commonly known as: NORVASC Take 1 tablet (10 mg total) by mouth daily.   atorvastatin 10 MG tablet Commonly known as: LIPITOR Take 1 tablet by mouth once daily   carvedilol 3.125 MG tablet Commonly known as: COREG Take 1 tablet by mouth twice daily   famotidine  20 MG tablet Commonly known as: PEPCID Take 20 mg by mouth daily.   ipratropium 0.06 % nasal spray Commonly known as: ATROVENT USE 2 SPRAY(S) IN EACH NOSTRIL EVERY 4 HOURS AS NEEDED   ketoconazole 2 % shampoo Commonly known as: NIZORAL Apply topically 2 (two) times a week.   levothyroxine 75 MCG tablet Commonly known as: SYNTHROID Take 1 tablet by mouth once daily        History (reviewed): Past Medical History:  Diagnosis Date   Atherosclerosis of aortic bifurcation and common iliac arteries ([place]) 01/26/2018   Seen on xray 01/26/18. Normal ABI study 2018   Cancer [facility])    Basal cell carcinoma 11/18/2020   COPD (chronic obstructive pulmonary disease) ([place]) 10/03/2015   Frequent PVCs 09/11/2015   History of prostate cancer 10/03/2015   Radical prostectomy     History of pulmonary embolus (PE) 10/07/2018   Late October 2019 small clot burden   History of TIA (transient ischemic attack) 09/11/2015   Hyperlipidemia    Hypertension    Hypertensive kidney disease with stage 3 chronic kidney disease ([place]) 09/11/2015   Hypertensive urgency 08/30/2019   Hypothyroidism 09/11/2015   Iron deficiency anemia 12/09/2018   Neuralgia, neuritis, and radiculitis, unspecified 03/20/2014   Neurologic orthostatic hypotension ([place]) 09/12/2014   Shingles    Thyroid disease    Past Surgical History:  Procedure Laterality Date   radical prostectomy  1994   removed section of  intestine  08/24/2005   polyp   SKIN CANCER EXCISION Left 11/18/2020   from the patient; done at Dr. Darliss Ridgel office; december 2021; removed from left arm and neck   Family History  Problem Relation Age of Onset   Prostate cancer Father    Stroke Father    Social History   Socioeconomic History   Marital status: Married    Spouse name: Romie Minus   Number of children: 1   Years of education: 12   Highest education level: 12th grade  Occupational History   Occupation: retired    Comment: management  Tobacco Use   Smoking  status: Former    Types: Cigarettes    Quit date: 12/11/1984    Years since quitting: 37.0   Smokeless tobacco: Never  Vaping Use   Vaping Use: Never used  Substance and Sexual Activity   Alcohol use: Not Currently   Drug use: No   Sexual activity: Not Currently    Partners: Female  Other Topics Concern   Not on file  Social History Narrative   Lives with his wife. He has one child, who lives at [facility]. He does have a niece who lives close by. He enjoys eating and sleeping.   Social Determinants of Health   Financial Resource Strain: Low Risk    Difficulty of Paying Living Expenses: Not hard at all  Food Insecurity: No Food Insecurity   Worried About Charity fundraiser in the Last Year: Never true   [place] in the Last Year: Never true  Transportation Needs: No Transportation Needs   Lack of Transportation (Medical): No   Lack of Transportation (Non-Medical): No  Physical Activity: Inactive   Days of Exercise per Week: 0 days   Minutes of Exercise per Session: 0 min  Stress: No Stress Concern Present   Feeling of Stress : Not at all  Social Connections: Moderately Isolated   Frequency of Communication with Friends and Family: Three times a week   Frequency of Social Gatherings with Friends and Family: Never   Attends Religious Services: Never   Marine scientist or Organizations: No   Attends Archivist Meetings: Never   Marital Status: Married    Activities of Daily Living In your present state of health, do you have any difficulty performing the following activities: 01/02/2022  Hearing? Y  Comment bilateral hearing loss.  Vision? Y  Comment has floaters in left eye.  Difficulty concentrating or making decisions? Y  Comment some memory loss.  Walking or climbing stairs? N  Dressing or bathing? N  Doing errands, shopping? N  Preparing Food and eating ? N  Using the Toilet? N  In the past six months, have you accidently leaked urine? Y   Comment has had trouble with leaking urine.  Do you have problems with loss of bowel control? N  Managing your Medications? N  Managing your Finances? N  Housekeeping or managing your Housekeeping? N  Some recent data might be hidden    Patient Education/Literacy How often do you need to have someone help you when you read instructions, pamphlets, or other written materials from your doctor or pharmacy?: 1 - Never What is the last grade level you completed in school?: 12th grade.  Exercise Current Exercise Habits: Home exercise routine, Type of exercise: walking, Time (Minutes): 15, Frequency (Times/Week): 4, Weekly Exercise (Minutes/Week): 60, Intensity: Mild, Exercise limited by: None identified  Diet Patient reports consuming 3 meals  a day and 1 snack(s) a day Patient reports that his primary diet is: Regular Patient reports that she does have regular access to food.   Depression Screen PHQ 2/9 Scores 01/02/2022 12/04/2020 12/04/2019 11/28/2018 07/26/2018 03/22/2017  PHQ - 2 Score 0 0 0 0 0 0  PHQ- 9 Score - 0 - - - -     Fall Risk Fall Risk  01/02/2022 12/04/2020 12/04/2019 04/07/2019 11/28/2018  Falls in the past year? 0 0 0 0 0  Number falls in past yr: 0 0 0 0 -  Injury with Fall? 0 0 0 0 -  Risk for fall due to : No Fall Risks No Fall Risks - - -  Follow up Falls evaluation completed;Education provided Falls evaluation completed Falls prevention discussed Falls evaluation completed -     Objective:   BP (!) 144/77 (BP Location: Right Arm, Patient Position: Sitting)    Pulse 73    Ht 5\' 5"  (1.651 m)    Wt 149 lb 1.9 oz (67.6 kg)    SpO2 98%    BMI 24.81 kg/m   Last Weight  Most recent update: 01/02/2022  2:01 PM    Weight  67.6 kg (149 lb 1.9 oz)             Body mass index is 24.81 kg/m.  Hearing/Vision  Mohmmad did not have difficulty with hearing/understanding during the face-to-face interview Raylan did  have difficulty with his vision during the face-to-face  interview Reports that he has had a formal eye exam by an eye care professional within the past year Reports that he has not had a formal hearing evaluation within the past year  Cognitive Function: 6CIT Screen 01/02/2022 12/04/2020 12/04/2019 11/28/2018  What Year? 0 points 0 points 0 points 0 points  What month? 0 points 0 points 0 points 0 points  What time? 0 points 0 points 0 points 0 points  Count back from 20 0 points 0 points 0 points 0 points  Months in reverse 0 points 0 points 0 points 0 points  Repeat phrase 0 points 4 points 2 points 2 points  Total Score 0 4 2 2     Normal Cognitive Function Screening: Yes (Normal:0-7, Significant for Dysfunction: >8)  Immunization & Health Maintenance Record Immunization History  Administered Date(s) Administered   Fluad Quad(high Dose 65+) 08/13/2021   Influenza, High Dose Seasonal PF 09/14/2017, 08/29/2018, 08/08/2019   Influenza-Unspecified 08/27/2015, 09/06/2016, 08/10/2020   Moderna Sars-Covid-2 Vaccination 07/11/2020, 08/10/2020   Pneumococcal Conjugate-13 09/16/2016   Pneumococcal Polysaccharide-23 01/26/2018   Tdap 01/26/2018   Zoster Recombinat (Shingrix) 12/11/2019, 02/23/2020    Health Maintenance  Topic Date Due   COVID-19 Vaccine (3 - Moderna risk series) 01/18/2022 (Originally 09/07/2020)   TETANUS/TDAP  01/27/2028   Pneumonia Vaccine 98+ Years old  Completed   INFLUENZA VACCINE  Completed   Zoster Vaccines- Shingrix  Completed   HPV VACCINES  Aged Out       Assessment  This is a routine wellness examination for [employer].  Health Maintenance: Due or Overdue There are no preventive care reminders to display for this patient.   Kathryne Eriksson Cafiero does not need a referral for Community Assistance: Care Management:   no Social Work:    no Prescription Assistance:  no Nutrition/Diabetes Education:  no   Plan:  Personalized Goals  Goals Addressed               This Visit's Progress  Patient  Stated (pt-stated)        01/02/2022 AWV Goal: Exercise for General Health  Patient will verbalize understanding of the benefits of increased physical activity: Exercising regularly is important. It will improve your overall fitness, flexibility, and endurance. Regular exercise also will improve your overall health. It can help you control your weight, reduce stress, and improve your bone density. Over the next year, patient will increase physical activity as tolerated with a goal of at least 150 minutes of moderate physical activity per week.  You can tell that you are exercising at a moderate intensity if your heart starts beating faster and you start breathing faster but can still hold a conversation. Moderate-intensity exercise ideas include: Walking 1 mile (1.6 km) in about 15 minutes Biking Hiking Golfing Dancing Water aerobics Patient will verbalize understanding of everyday activities that increase physical activity by providing examples like the following: Yard work, such as: Sales promotion account executive Gardening Washing windows or floors Patient will be able to explain general safety guidelines for exercising:  Before you start a new exercise program, talk with your health care provider. Do not exercise so much that you hurt yourself, feel dizzy, or get very short of breath. Wear comfortable clothes and wear shoes with good support. Drink plenty of water while you exercise to prevent dehydration or heat stroke. Work out until your breathing and your heartbeat get faster.        Personalized Health Maintenance & Screening Recommendations  There are no preventive care reminders to display for this patient.  Lung Cancer Screening Recommended: no (Low Dose CT Chest recommended if Age 46-80 years, 30 pack-year currently smoking OR have quit w/in past 15 years) Hepatitis C Screening recommended: no HIV  Screening recommended: no  Advanced Directives: Written information was not given per the patient's request.  Referrals & Orders No orders of the defined types were placed in this encounter.   Follow-up Plan Follow-up with Luetta Nutting, DO as planned Medicare wellness visit in one year. AVS printed and given to the patient.   I have personally reviewed and noted the following in the patients chart:   Medical and social history Use of alcohol, tobacco or illicit drugs  Current medications and supplements Functional ability and status Nutritional status Physical activity Advanced directives List of other physicians Hospitalizations, surgeries, and ER visits in previous 12 months Vitals Screenings to include cognitive, depression, and falls Referrals and appointments  In addition, I have reviewed and discussed with patient certain preventive protocols, quality metrics, and best practice recommendations. A written personalized care plan for preventive services as well as general preventive health recommendations were provided to patient.     Tinnie Gens, RN  01/02/2022

## 2022-01-02 NOTE — Patient Instructions (Addendum)
[place] Maintenance Summary and Written Plan of Care  [name] ,  Thank you for allowing me to perform your Medicare Annual Wellness Visit and for your ongoing commitment to your health.   Health Maintenance & Immunization History Health Maintenance  Topic Date Due   COVID-19 Vaccine (3 - Moderna risk series) 01/18/2022 (Originally 09/07/2020)   TETANUS/TDAP  01/27/2028   Pneumonia Vaccine 86+ Years old  Completed   INFLUENZA VACCINE  Completed   Zoster Vaccines- Shingrix  Completed   HPV VACCINES  Aged Out   Immunization History  Administered Date(s) Administered   Fluad Quad(high Dose 65+) 08/13/2021   Influenza, High Dose Seasonal PF 09/14/2017, 08/29/2018, 08/08/2019   Influenza-Unspecified 08/27/2015, 09/06/2016, 08/10/2020   Moderna Sars-Covid-2 Vaccination 07/11/2020, 08/10/2020   Pneumococcal Conjugate-13 09/16/2016   Pneumococcal Polysaccharide-23 01/26/2018   Tdap 01/26/2018   Zoster Recombinat (Shingrix) 12/11/2019, 02/23/2020    These are the patient goals that we discussed:  Goals Addressed               This Visit's Progress     Patient Stated (pt-stated)        01/02/2022 AWV Goal: Exercise for General Health  Patient will verbalize understanding of the benefits of increased physical activity: Exercising regularly is important. It will improve your overall fitness, flexibility, and endurance. Regular exercise also will improve your overall health. It can help you control your weight, reduce stress, and improve your bone density. Over the next year, patient will increase physical activity as tolerated with a goal of at least 150 minutes of moderate physical activity per week.  You can tell that you are exercising at a moderate intensity if your heart starts beating faster and you start breathing faster but can still hold a conversation. Moderate-intensity exercise ideas include: Walking 1 mile (1.6 km) in about 15  minutes Biking Hiking Golfing Dancing Water aerobics Patient will verbalize understanding of everyday activities that increase physical activity by providing examples like the following: Yard work, such as: Sales promotion account executive Gardening Washing windows or floors Patient will be able to explain general safety guidelines for exercising:  Before you start a new exercise program, talk with your health care provider. Do not exercise so much that you hurt yourself, feel dizzy, or get very short of breath. Wear comfortable clothes and wear shoes with good support. Drink plenty of water while you exercise to prevent dehydration or heat stroke. Work out until your breathing and your heartbeat get faster.          This is a list of Health Maintenance Items that are overdue or due now: There are no preventive care reminders to display for this patient.    Orders/Referrals Placed Today: No orders of the defined types were placed in this encounter.  (Contact our referral department at [phone] if you have not spoken with someone about your referral appointment within the next 5 days)    Follow-up Plan Follow-up with Luetta Nutting, DO as planned Medicare wellness visit in one year. AVS printed and given to the patient.      Health Maintenance, Male Adopting a healthy lifestyle and getting preventive care are important in promoting health and wellness. Ask your health care provider about: The right schedule for you to have regular tests and exams. Things you can do on your own to prevent diseases and keep yourself healthy. What should I  know about diet, weight, and exercise? Eat a healthy diet  Eat a diet that includes plenty of vegetables, fruits, low-fat dairy products, and lean protein. Do not eat a lot of foods that are high in solid fats, added sugars, or sodium. Maintain a healthy  weight Body mass index (BMI) is a measurement that can be used to identify possible weight problems. It estimates body fat based on height and weight. Your health care provider can help determine your BMI and help you achieve or maintain a healthy weight. Get regular exercise Get regular exercise. This is one of the most important things you can do for your health. Most adults should: Exercise for at least 150 minutes each week. The exercise should increase your heart rate and make you sweat (moderate-intensity exercise). Do strengthening exercises at least twice a week. This is in addition to the moderate-intensity exercise. Spend less time sitting. Even light physical activity can be beneficial. Watch cholesterol and blood lipids Have your blood tested for lipids and cholesterol at 86 years of age, then have this test every 5 years. You may need to have your cholesterol levels checked more often if: Your lipid or cholesterol levels are high. You are older than 86 years of age. You are at high risk for heart disease. What should I know about cancer screening? Many types of cancers can be detected early and may often be prevented. Depending on your health history and family history, you may need to have cancer screening at various ages. This may include screening for: Colorectal cancer. Prostate cancer. Skin cancer. Lung cancer. What should I know about heart disease, diabetes, and high blood pressure? Blood pressure and heart disease High blood pressure causes heart disease and increases the risk of stroke. This is more likely to develop in people who have high blood pressure readings or are overweight. Talk with your health care provider about your target blood pressure readings. Have your blood pressure checked: Every 3-5 years if you are 86-69 years of age. Every year if you are 86 years old or older. If you are between the ages of 12 and 12 and are a current or former smoker, ask your  health care provider if you should have a one-time screening for abdominal aortic aneurysm (AAA). Diabetes Have regular diabetes screenings. This checks your fasting blood sugar level. Have the screening done: Once every three years after age 86 if you are at a normal weight and have a low risk for diabetes. More often and at a younger age if you are overweight or have a high risk for diabetes. What should I know about preventing infection? Hepatitis B If you have a higher risk for hepatitis B, you should be screened for this virus. Talk with your health care provider to find out if you are at risk for hepatitis B infection. Hepatitis C Blood testing is recommended for: Everyone born from [birth year] through 1963. Anyone with known risk factors for hepatitis C. Sexually transmitted infections (STIs) You should be screened each year for STIs, including gonorrhea and chlamydia, if: You are sexually active and are younger than 86 years of age. You are older than 86 years of age and your health care provider tells you that you are at risk for this type of infection. Your sexual activity has changed since you were last screened, and you are at increased risk for chlamydia or gonorrhea. Ask your health care provider if you are at risk. Ask your health care provider about  whether you are at high risk for HIV. Your health care provider may recommend a prescription medicine to help prevent HIV infection. If you choose to take medicine to prevent HIV, you should first get tested for HIV. You should then be tested every 3 months for as long as you are taking the medicine. Follow these instructions at home: Alcohol use Do not drink alcohol if your health care provider tells you not to drink. If you drink alcohol: Limit how much you have to 0-2 drinks a day. Know how much alcohol is in your drink. In the [place] one drink equals one 12 oz bottle of beer (355 mL), one 5 oz glass of wine (148 mL), or one 1 oz glass  of hard liquor (44 mL). Lifestyle Do not use any products that contain nicotine or tobacco. These products include cigarettes, chewing tobacco, and vaping devices, such as e-cigarettes. If you need help quitting, ask your health care provider. Do not use street drugs. Do not share needles. Ask your health care provider for help if you need support or information about quitting drugs. General instructions Schedule regular health, dental, and eye exams. Stay current with your vaccines. Tell your health care provider if: You often feel depressed. You have ever been abused or do not feel safe at home. Summary Adopting a healthy lifestyle and getting preventive care are important in promoting health and wellness. Follow your health care provider's instructions about healthy diet, exercising, and getting tested or screened for diseases. Follow your health care provider's instructions on monitoring your cholesterol and blood pressure. This information is not intended to replace advice given to you by your health care provider. Make sure you discuss any questions you have with your health care provider. Document Revised: 04/14/2021 Document Reviewed: 04/14/2021 Elsevier Patient Education  [place].

## 2022-01-12 DIAGNOSIS — D485 Neoplasm of uncertain behavior of skin: Secondary | ICD-10-CM | POA: Diagnosis not present

## 2022-01-12 DIAGNOSIS — W908XXS Exposure to other nonionizing radiation, sequela: Secondary | ICD-10-CM | POA: Diagnosis not present

## 2022-01-12 DIAGNOSIS — L570 Actinic keratosis: Secondary | ICD-10-CM | POA: Diagnosis not present

## 2022-01-12 DIAGNOSIS — L578 Other skin changes due to chronic exposure to nonionizing radiation: Secondary | ICD-10-CM | POA: Diagnosis not present

## 2022-01-12 DIAGNOSIS — L2089 Other atopic dermatitis: Secondary | ICD-10-CM | POA: Diagnosis not present

## 2022-01-28 DIAGNOSIS — L249 Irritant contact dermatitis, unspecified cause: Secondary | ICD-10-CM | POA: Diagnosis not present

## 2022-03-02 ENCOUNTER — Telehealth: Payer: Self-pay | Admitting: Family Medicine

## 2022-03-02 ENCOUNTER — Other Ambulatory Visit: Payer: Self-pay

## 2022-03-02 MED ORDER — LEVOTHYROXINE SODIUM 75 MCG PO TABS: 75.0000 ug | ORAL_TABLET | Freq: Every day | ORAL | 2 refills | Status: DC

## 2022-03-02 NOTE — Telephone Encounter (Signed)
Patient came in to office and requested a refill on his medication - lmr. ?levothyroxine (SYNTHROID) 75 MCG tablet ? ?

## 2022-03-02 NOTE — Telephone Encounter (Signed)
Rx sent to pharmacy   

## 2022-03-02 NOTE — Telephone Encounter (Signed)
Thanks

## 2022-03-18 ENCOUNTER — Other Ambulatory Visit: Payer: Self-pay | Admitting: Family Medicine

## 2022-04-25 ENCOUNTER — Other Ambulatory Visit: Payer: Self-pay | Admitting: Family Medicine

## 2022-05-11 ENCOUNTER — Encounter: Payer: Self-pay | Admitting: Family Medicine

## 2022-05-11 ENCOUNTER — Ambulatory Visit (INDEPENDENT_AMBULATORY_CARE_PROVIDER_SITE_OTHER): Payer: Medicare Other | Admitting: Family Medicine

## 2022-05-11 VITALS — BP 145/66 | HR 65 | Ht 65.0 in | Wt 146.0 lb

## 2022-05-11 DIAGNOSIS — R32 Unspecified urinary incontinence: Secondary | ICD-10-CM | POA: Diagnosis not present

## 2022-05-11 DIAGNOSIS — I10 Essential (primary) hypertension: Secondary | ICD-10-CM

## 2022-05-11 LAB — POCT URINALYSIS DIP (CLINITEK)
Bilirubin, UA: NEGATIVE
Blood, UA: NEGATIVE
Glucose, UA: NEGATIVE mg/dL
Ketones, POC UA: NEGATIVE mg/dL
Leukocytes, UA: NEGATIVE
Nitrite, UA: NEGATIVE
Spec Grav, UA: 1.02 (ref 1.010–1.025)
Urobilinogen, UA: 0.2 E.U./dL
pH, UA: 6 (ref 5.0–8.0)

## 2022-05-11 NOTE — Assessment & Plan Note (Signed)
Likely related to his prior radical prostatectomy.  Urinalysis ordered

## 2022-05-11 NOTE — Patient Instructions (Signed)
Continue current medications.  See me again in 6 months or sooner if needed.  

## 2022-05-11 NOTE — Progress Notes (Signed)
Benjamin Bryan - 88 y.o. male MRN [id number]  Date of birth: 1928/01/12  Subjective Chief Complaint  Patient presents with   Dizziness    HPI Benjamin Bryan is a 88 year old male here today for follow-up.  Reports overall he is feeling well.  He brings a list of his blood pressures.  Ranging from 121-150/60-85.  Overall feels like he is tolerating the medication well.  He does get some dizziness if standing up too fast at times.  He has not had chest pain, shortness of breath, palpitations, headaches or vision changes.  He has had some mild urinary leakage.  Slightly worse than his baseline.  Denies pain with urination.  ROS:  A comprehensive ROS was completed and negative except as noted per HPI  No Known Allergies  Past Medical History:  Diagnosis Date   Atherosclerosis of aortic bifurcation and common iliac arteries ([place]) 01/26/2018   Seen on xray 01/26/18. Normal ABI study 2018   Cancer [facility])    Basal cell carcinoma 11/18/2020   COPD (chronic obstructive pulmonary disease) ([place]) 10/03/2015   Frequent PVCs 09/11/2015   History of prostate cancer 10/03/2015   Radical prostectomy     History of pulmonary embolus (PE) 10/07/2018   Late October 2019 small clot burden   History of TIA (transient ischemic attack) 09/11/2015   Hyperlipidemia    Hypertension    Hypertensive kidney disease with stage 3 chronic kidney disease ([place]) 09/11/2015   Hypertensive urgency 08/30/2019   Hypothyroidism 09/11/2015   Iron deficiency anemia 12/09/2018   Neuralgia, neuritis, and radiculitis, unspecified 03/20/2014   Neurologic orthostatic hypotension ([place]) 09/12/2014   Shingles    Thyroid disease     Past Surgical History:  Procedure Laterality Date   radical prostectomy  1994   removed section of intestine  08/24/2005   polyp   SKIN CANCER EXCISION Left 11/18/2020   from the patient; done at Dr. Darliss Ridgel office; december 2021; removed from left arm and neck    Social History   Socioeconomic History    Marital status: Married    Spouse name: Romie Minus   Number of children: 1   Years of education: 12   Highest education level: 12th grade  Occupational History   Occupation: retired    Comment: management  Tobacco Use   Smoking status: Former    Types: Cigarettes    Quit date: 12/11/1984    Years since quitting: 37.4   Smokeless tobacco: Never  Vaping Use   Vaping Use: Never used  Substance and Sexual Activity   Alcohol use: Not Currently   Drug use: No   Sexual activity: Not Currently    Partners: Female  Other Topics Concern   Not on file  Social History Narrative   Lives with his wife. He has one child, who lives at [facility]. He does have a niece who lives close by. He enjoys eating and sleeping.   Social Determinants of Health   Financial Resource Strain: Low Risk    Difficulty of Paying Living Expenses: Not hard at all  Food Insecurity: No Food Insecurity   Worried About Charity fundraiser in the Last Year: Never true   [place] in the Last Year: Never true  Transportation Needs: No Transportation Needs   Lack of Transportation (Medical): No   Lack of Transportation (Non-Medical): No  Physical Activity: Inactive   Days of Exercise per Week: 0 days   Minutes of Exercise per Session: 0 min  Stress:  No Stress Concern Present   Feeling of Stress : Not at all  Social Connections: Moderately Isolated   Frequency of Communication with Friends and Family: Three times a week   Frequency of Social Gatherings with Friends and Family: Never   Attends Religious Services: Never   Marine scientist or Organizations: No   Attends Music therapist: Never   Marital Status: Married    Family History  Problem Relation Age of Onset   Prostate cancer Father    Stroke Father     Health Maintenance  Topic Date Due   COVID-19 Vaccine (3 - Moderna risk series) 08/07/2022 (Originally 09/07/2020)   INFLUENZA VACCINE  07/07/2022   TETANUS/TDAP  01/27/2028    Pneumonia Vaccine 31+ Years old  Completed   Zoster Vaccines- Shingrix  Completed   HPV VACCINES  Aged Out     ----------------------------------------------------------------------------------------------------------------------------------------------------------------------------------------------------------------- Physical Exam BP (!) 145/66 (BP Location: Left Arm, Patient Position: Sitting, Cuff Size: Normal)   Pulse 65   Ht '5\' 5"'$  (1.651 m)   Wt 146 lb (66.2 kg)   SpO2 99%   BMI 24.30 kg/m   Physical Exam Constitutional:      Appearance: Normal appearance.  Eyes:     General: No scleral icterus. Cardiovascular:     Rate and Rhythm: Normal rate and regular rhythm.  Musculoskeletal:     Cervical back: Neck supple.  Neurological:     Mental Status: He is alert.  Psychiatric:        Mood and Affect: Mood normal.        Behavior: Behavior normal.    ------------------------------------------------------------------------------------------------------------------------------------------------------------------------------------------------------------------- Assessment and Plan  Essential hypertension Blood pressure does fluctuate some but overall has been well controlled based on readings at home.  I think for his age blood pressure is acceptable at this time.  Recommend continuation of current medications.  Urinary incontinence Likely related to his prior radical prostatectomy.  Urinalysis ordered   No orders of the defined types were placed in this encounter.   Return in about 6 months (around 11/10/2022) for HTN.    This visit occurred during the SARS-CoV-2 public health emergency.  Safety protocols were in place, including screening questions prior to the visit, additional usage of staff PPE, and extensive cleaning of exam room while observing appropriate contact time as indicated for disinfecting solutions.

## 2022-05-11 NOTE — Assessment & Plan Note (Signed)
Blood pressure does fluctuate some but overall has been well controlled based on readings at home.  I think for his age blood pressure is acceptable at this time.  Recommend continuation of current medications.

## 2022-05-20 DIAGNOSIS — Z85828 Personal history of other malignant neoplasm of skin: Secondary | ICD-10-CM | POA: Diagnosis not present

## 2022-05-20 DIAGNOSIS — L218 Other seborrheic dermatitis: Secondary | ICD-10-CM | POA: Diagnosis not present

## 2022-05-20 DIAGNOSIS — L2089 Other atopic dermatitis: Secondary | ICD-10-CM | POA: Diagnosis not present

## 2022-05-20 DIAGNOSIS — Z79899 Other long term (current) drug therapy: Secondary | ICD-10-CM | POA: Diagnosis not present

## 2022-05-20 DIAGNOSIS — L309 Dermatitis, unspecified: Secondary | ICD-10-CM | POA: Diagnosis not present

## 2022-05-20 DIAGNOSIS — L821 Other seborrheic keratosis: Secondary | ICD-10-CM | POA: Diagnosis not present

## 2022-07-21 ENCOUNTER — Other Ambulatory Visit: Payer: Self-pay | Admitting: Family Medicine

## 2022-08-03 DIAGNOSIS — D692 Other nonthrombocytopenic purpura: Secondary | ICD-10-CM | POA: Diagnosis not present

## 2022-08-03 DIAGNOSIS — L2089 Other atopic dermatitis: Secondary | ICD-10-CM | POA: Diagnosis not present

## 2022-08-03 DIAGNOSIS — L218 Other seborrheic dermatitis: Secondary | ICD-10-CM | POA: Diagnosis not present

## 2022-08-24 ENCOUNTER — Other Ambulatory Visit: Payer: Self-pay | Admitting: Cardiology

## 2022-08-28 ENCOUNTER — Other Ambulatory Visit: Payer: Self-pay | Admitting: Cardiology

## 2022-08-31 ENCOUNTER — Other Ambulatory Visit: Payer: Self-pay | Admitting: Cardiology

## 2022-09-15 ENCOUNTER — Other Ambulatory Visit: Payer: Self-pay | Admitting: Cardiology

## 2022-09-16 ENCOUNTER — Telehealth: Payer: Self-pay | Admitting: Cardiology

## 2022-09-16 NOTE — Telephone Encounter (Signed)
*  STAT* If patient is at the pharmacy, call can be transferred to refill team.   1. Which medications need to be refilled? (please list name of each medication and dose if known) carvedilol (COREG) 3.125 MG tablet  2. Which pharmacy/location (including street and city if local pharmacy) is medication to be sent to? [place], [place]  3. Do they need a 30 day or 90 day supply? 90   Patient has appt on 11/20

## 2022-09-17 NOTE — Telephone Encounter (Signed)
Medication refill sent on 09/16/2022.

## 2022-09-24 ENCOUNTER — Other Ambulatory Visit: Payer: Self-pay | Admitting: Cardiology

## 2022-10-02 ENCOUNTER — Other Ambulatory Visit: Payer: Self-pay

## 2022-10-02 MED ORDER — CARVEDILOL 3.125 MG PO TABS: 3.1250 mg | ORAL_TABLET | Freq: Two times a day (BID) | ORAL | 2 refills | Status: DC

## 2022-10-02 MED ORDER — CARVEDILOL 3.125 MG PO TABS: 3.1250 mg | ORAL_TABLET | Freq: Two times a day (BID) | ORAL | 1 refills | Status: DC

## 2022-10-08 ENCOUNTER — Other Ambulatory Visit: Payer: Self-pay | Admitting: Family Medicine

## 2022-10-26 ENCOUNTER — Ambulatory Visit: Payer: Medicare Other | Admitting: Cardiology

## 2022-10-28 ENCOUNTER — Ambulatory Visit: Payer: Medicare Other | Admitting: Cardiology

## 2022-10-30 ENCOUNTER — Other Ambulatory Visit: Payer: Self-pay | Admitting: Family Medicine

## 2022-11-17 ENCOUNTER — Encounter: Payer: Self-pay | Admitting: Family Medicine

## 2022-11-17 ENCOUNTER — Ambulatory Visit (INDEPENDENT_AMBULATORY_CARE_PROVIDER_SITE_OTHER): Payer: Medicare Other | Admitting: Family Medicine

## 2022-11-17 ENCOUNTER — Ambulatory Visit (INDEPENDENT_AMBULATORY_CARE_PROVIDER_SITE_OTHER): Payer: Medicare Other

## 2022-11-17 VITALS — BP 131/66 | HR 76 | Ht 65.0 in | Wt 143.0 lb

## 2022-11-17 DIAGNOSIS — R5383 Other fatigue: Secondary | ICD-10-CM

## 2022-11-17 DIAGNOSIS — G903 Multi-system degeneration of the autonomic nervous system: Secondary | ICD-10-CM

## 2022-11-17 DIAGNOSIS — R0989 Other specified symptoms and signs involving the circulatory and respiratory systems: Secondary | ICD-10-CM

## 2022-11-17 DIAGNOSIS — R011 Cardiac murmur, unspecified: Secondary | ICD-10-CM | POA: Diagnosis not present

## 2022-11-17 DIAGNOSIS — E782 Mixed hyperlipidemia: Secondary | ICD-10-CM | POA: Diagnosis not present

## 2022-11-17 DIAGNOSIS — I6523 Occlusion and stenosis of bilateral carotid arteries: Secondary | ICD-10-CM | POA: Diagnosis not present

## 2022-11-17 DIAGNOSIS — G459 Transient cerebral ischemic attack, unspecified: Secondary | ICD-10-CM

## 2022-11-17 DIAGNOSIS — E039 Hypothyroidism, unspecified: Secondary | ICD-10-CM

## 2022-11-17 DIAGNOSIS — I10 Essential (primary) hypertension: Secondary | ICD-10-CM

## 2022-11-17 DIAGNOSIS — D500 Iron deficiency anemia secondary to blood loss (chronic): Secondary | ICD-10-CM

## 2022-11-17 DIAGNOSIS — N1831 Chronic kidney disease, stage 3a: Secondary | ICD-10-CM

## 2022-11-17 DIAGNOSIS — R42 Dizziness and giddiness: Secondary | ICD-10-CM | POA: Diagnosis not present

## 2022-11-17 DIAGNOSIS — R001 Bradycardia, unspecified: Secondary | ICD-10-CM

## 2022-11-17 NOTE — Assessment & Plan Note (Signed)
Has been sleeping more recently.  Updating TSH.

## 2022-11-17 NOTE — Progress Notes (Signed)
Benjamin Bryan - 88 y.o. male MRN [id number]  Date of birth: 1928/11/11  Subjective Chief Complaint  Patient presents with   Hypertension    HPI Benjamin Bryan is a 88 y.o. male here today for follow up visit. He is accompanied by his wife today.  He reports he has had some increased episodes of dizziness as well as fatigue over the past several months.  He does have history of iron deficiency anemia.  Blood pressures at home have been fairly well-controlled.  He has had a couple of low heart rate readings at home.  He has not had any chest pain, increased dyspnea, headaches or vision changes.  His wife does report that he has spells where he seems "out of it" and these last for couple minutes before resolving.  She states that he sits there and stares off.  He reports he does recall these events but does not know why they occur.  No seizure-like activity reported.  His wife does report that he does sleep a lot throughout the day.  Typically gets up in the morning has breakfast and goes back to bed from 930-noon, gets up and eats lunch and does a couple things.  He then lays back down from 2 30-4 o'clock.  Goes to bed around 730 back up at around 5 AM.  ROS:  A comprehensive ROS was completed and negative except as noted per HPI    No Known Allergies  Past Medical History:  Diagnosis Date   Atherosclerosis of aortic bifurcation and common iliac arteries ([place]) 01/26/2018   Seen on xray 01/26/18. Normal ABI study 2018   Cancer [facility])    Basal cell carcinoma 11/18/2020   COPD (chronic obstructive pulmonary disease) ([place]) 10/03/2015   Frequent PVCs 09/11/2015   History of prostate cancer 10/03/2015   Radical prostectomy     History of pulmonary embolus (PE) 10/07/2018   Late October 2019 small clot burden   History of TIA (transient ischemic attack) 09/11/2015   Hyperlipidemia    Hypertension    Hypertensive kidney disease with stage 3 chronic kidney disease ([place]) 09/11/2015   Hypertensive  urgency 08/30/2019   Hypothyroidism 09/11/2015   Iron deficiency anemia 12/09/2018   Neuralgia, neuritis, and radiculitis, unspecified 03/20/2014   Neurologic orthostatic hypotension ([place]) 09/12/2014   Shingles    Thyroid disease     Past Surgical History:  Procedure Laterality Date   radical prostectomy  1994   removed section of intestine  08/24/2005   polyp   SKIN CANCER EXCISION Left 11/18/2020   from the patient; done at Dr. Darliss Ridgel office; december 2021; removed from left arm and neck    Social History   Socioeconomic History   Marital status: Married    Spouse name: Romie Minus   Number of children: 1   Years of education: 12   Highest education level: 12th grade  Occupational History   Occupation: retired    Comment: management  Tobacco Use   Smoking status: Former    Types: Cigarettes    Quit date: 12/11/1984    Years since quitting: 37.9   Smokeless tobacco: Never  Vaping Use   Vaping Use: Never used  Substance and Sexual Activity   Alcohol use: Not Currently   Drug use: No   Sexual activity: Not Currently    Partners: Female  Other Topics Concern   Not on file  Social History Narrative   Lives with his wife. He has one child, who lives at  [place]. He does have a niece who lives close by. He enjoys eating and sleeping.   Social Determinants of Health   Financial Resource Strain: Low Risk  (01/02/2022)   Overall Financial Resource Strain (CARDIA)    Difficulty of Paying Living Expenses: Not hard at all  Food Insecurity: No Food Insecurity (01/02/2022)   Hunger Vital Sign    Worried About Running Out of Food in the Last Year: Never true    Ran Out of Food in the Last Year: Never true  Transportation Needs: No Transportation Needs (01/02/2022)   PRAPARE - Hydrologist (Medical): No    Lack of Transportation (Non-Medical): No  Physical Activity: Inactive (01/02/2022)   Exercise Vital Sign    Days of Exercise per Week: 0 days    Minutes  of Exercise per Session: 0 min  Stress: No Stress Concern Present (01/02/2022)   [place]    Feeling of Stress : Not at all  Social Connections: Moderately Isolated (01/02/2022)   Social Connection and Isolation Panel [NHANES]    Frequency of Communication with Friends and Family: Three times a week    Frequency of Social Gatherings with Friends and Family: Never    Attends Religious Services: Never    Marine scientist or Organizations: No    Attends Music therapist: Never    Marital Status: Married    Family History  Problem Relation Age of Onset   Prostate cancer Father    Stroke Father     Health Maintenance  Topic Date Due   Medicare Annual Wellness (AWV)  01/02/2023   COVID-19 Vaccine (3 - Moderna risk series) 01/07/2023 (Originally 09/07/2020)   DTaP/Tdap/Td (2 - Td or Tdap) 01/27/2028   Pneumonia Vaccine 77+ Years old  Completed   INFLUENZA VACCINE  Completed   Zoster Vaccines- Shingrix  Completed   HPV VACCINES  Aged Out     ----------------------------------------------------------------------------------------------------------------------------------------------------------------------------------------------------------------- Physical Exam BP 131/66 (BP Location: Left Arm, Patient Position: Sitting, Cuff Size: Normal)   Pulse 76   Ht '5\' 5"'$  (1.651 m)   Wt 143 lb (64.9 kg)   SpO2 99%   BMI 23.80 kg/m   Physical Exam Constitutional:      Appearance: Normal appearance.  HENT:     Head: Normocephalic and atraumatic.  Eyes:     General: No scleral icterus. Neck:     Comments: Left-sided bruit noted Cardiovascular:     Rate and Rhythm: Normal rate and regular rhythm.  Pulmonary:     Effort: Pulmonary effort is normal.     Breath sounds: Normal breath sounds.  Musculoskeletal:     Cervical back: Neck supple.  Neurological:     Mental Status: He is alert.  Psychiatric:         Mood and Affect: Mood normal.        Behavior: Behavior normal.    EKG: Normal sinus rhythm.  Normal PR and QTc intervals. ------------------------------------------------------------------------------------------------------------------------------------------------------------------------------------------------------------------- Assessment and Plan  Essential hypertension Blood pressure is well-controlled.  He has had a couple episodes of altered awareness with future recall leave episodes.  Left-sided bruit noted.  Wife reports that he sleeps a lot throughout the day.  Carotid ultrasound ordered.  EKG completed today with normal sinus rhythm.  Sending for echocardiogram as well.  Updating labs today.  Neurologic orthostatic hypotension (HCC) His dizziness symptoms may be just a continuation of his orthostatic hypotension.  Will  get a carotid and echo ordered today.  Hypothyroidism Has been sleeping more recently.  Updating TSH.  CKD (chronic kidney disease) stage 3, GFR 30-59 ml/min (HCC) Updating renal function.  Iron deficiency anemia Checking CBC and ferritin.   No orders of the defined types were placed in this encounter.   No follow-ups on file.    This visit occurred during the SARS-CoV-2 public health emergency.  Safety protocols were in place, including screening questions prior to the visit, additional usage of staff PPE, and extensive cleaning of exam room while observing appropriate contact time as indicated for disinfecting solutions.

## 2022-11-17 NOTE — Patient Instructions (Signed)
Have labs completed when fasting.  You will be contacted to set up ultrasound of your heart and carotid arteries.

## 2022-11-17 NOTE — Assessment & Plan Note (Signed)
Updating renal function.

## 2022-11-17 NOTE — Assessment & Plan Note (Signed)
Checking CBC and ferritin.  

## 2022-11-17 NOTE — Assessment & Plan Note (Signed)
Blood pressure is well-controlled.  He has had a couple episodes of altered awareness with future recall leave episodes.  Left-sided bruit noted.  Wife reports that he sleeps a lot throughout the day.  Carotid ultrasound ordered.  EKG completed today with normal sinus rhythm.  Sending for echocardiogram as well.  Updating labs today.

## 2022-11-17 NOTE — Assessment & Plan Note (Signed)
His dizziness symptoms may be just a continuation of his orthostatic hypotension.  Will get a carotid and echo ordered today.

## 2022-11-19 DIAGNOSIS — D500 Iron deficiency anemia secondary to blood loss (chronic): Secondary | ICD-10-CM | POA: Diagnosis not present

## 2022-11-19 DIAGNOSIS — I10 Essential (primary) hypertension: Secondary | ICD-10-CM | POA: Diagnosis not present

## 2022-11-19 DIAGNOSIS — E782 Mixed hyperlipidemia: Secondary | ICD-10-CM | POA: Diagnosis not present

## 2022-11-19 DIAGNOSIS — E039 Hypothyroidism, unspecified: Secondary | ICD-10-CM | POA: Diagnosis not present

## 2022-11-20 LAB — COMPLETE METABOLIC PANEL WITH GFR
AG Ratio: 1.7 (calc) (ref 1.0–2.5)
ALT: 11 U/L (ref 9–46)
AST: 14 U/L (ref 10–35)
Albumin: 4.1 g/dL (ref 3.6–5.1)
Alkaline phosphatase (APISO): 60 U/L (ref 35–144)
BUN/Creatinine Ratio: 15 (calc) (ref 6–22)
BUN: 21 mg/dL (ref 7–25)
CO2: 26 mmol/L (ref 20–32)
Calcium: 9.3 mg/dL (ref 8.6–10.3)
Chloride: 105 mmol/L (ref 98–110)
Creat: 1.43 mg/dL — ABNORMAL HIGH (ref 0.70–1.22)
Globulin: 2.4 g/dL (calc) (ref 1.9–3.7)
Glucose, Bld: 98 mg/dL (ref 65–99)
Potassium: 4.6 mmol/L (ref 3.5–5.3)
Sodium: 141 mmol/L (ref 135–146)
Total Bilirubin: 0.8 mg/dL (ref 0.2–1.2)
Total Protein: 6.5 g/dL (ref 6.1–8.1)
eGFR: 46 mL/min/{1.73_m2} — ABNORMAL LOW (ref >=60)

## 2022-11-20 LAB — CBC WITH DIFFERENTIAL/PLATELET
Absolute Monocytes: 799 cells/uL (ref 200–950)
Basophils Absolute: 49 cells/uL (ref 0–200)
Basophils Relative: 0.8 %
Eosinophils Absolute: 92 cells/uL (ref 15–500)
Eosinophils Relative: 1.5 %
HCT: 40.9 % (ref 38.5–50.0)
Hemoglobin: 13.8 g/dL (ref 13.2–17.1)
Lymphs Abs: 1232 cells/uL (ref 850–3900)
MCH: 32.3 pg (ref 27.0–33.0)
MCHC: 33.7 g/dL (ref 32.0–36.0)
MCV: 95.8 fL (ref 80.0–100.0)
MPV: 10.5 fL (ref 7.5–12.5)
Monocytes Relative: 13.1 %
Neutro Abs: 3928 cells/uL (ref 1500–7800)
Neutrophils Relative %: 64.4 %
Platelets: 310 10*3/uL (ref 140–400)
RBC: 4.27 10*6/uL (ref 4.20–5.80)
RDW: 11.8 % (ref 11.0–15.0)
Total Lymphocyte: 20.2 %
WBC: 6.1 10*3/uL (ref 3.8–10.8)

## 2022-11-20 LAB — IRON,TIBC AND FERRITIN PANEL
%SAT: 35 % (calc) (ref 20–48)
Ferritin: 26 ng/mL (ref 24–380)
Iron: 102 ug/dL (ref 50–180)
TIBC: 294 mcg/dL (calc) (ref 250–425)

## 2022-11-20 LAB — LIPID PANEL W/REFLEX DIRECT LDL
Cholesterol: 195 mg/dL (ref <200)
HDL: 60 mg/dL (ref >=40)
LDL Cholesterol (Calc): 119 mg/dL (calc) — ABNORMAL HIGH
Non-HDL Cholesterol (Calc): 135 mg/dL (calc) — ABNORMAL HIGH (ref <130)
Total CHOL/HDL Ratio: 3.3 (calc) (ref <5.0)
Triglycerides: 66 mg/dL (ref <150)

## 2022-11-20 LAB — TSH: TSH: 3.26 mIU/L (ref 0.40–4.50)

## 2022-11-24 ENCOUNTER — Other Ambulatory Visit: Payer: Self-pay | Admitting: Family Medicine

## 2022-11-24 DIAGNOSIS — E079 Disorder of thyroid, unspecified: Secondary | ICD-10-CM

## 2022-12-03 ENCOUNTER — Ambulatory Visit (HOSPITAL_COMMUNITY): Payer: Medicare Other | Attending: Cardiology

## 2022-12-03 DIAGNOSIS — G459 Transient cerebral ischemic attack, unspecified: Secondary | ICD-10-CM | POA: Diagnosis not present

## 2022-12-03 DIAGNOSIS — R5383 Other fatigue: Secondary | ICD-10-CM

## 2022-12-03 DIAGNOSIS — R42 Dizziness and giddiness: Secondary | ICD-10-CM

## 2022-12-03 LAB — ECHOCARDIOGRAM COMPLETE
Area-P 1/2: 4.39 cm2
Calc EF: 58.5 %
S' Lateral: 3.4 cm
Single Plane A2C EF: 65.1 %
Single Plane A4C EF: 52.4 %

## 2023-01-08 ENCOUNTER — Telehealth: Payer: Self-pay | Admitting: General Practice

## 2023-01-08 ENCOUNTER — Ambulatory Visit (INDEPENDENT_AMBULATORY_CARE_PROVIDER_SITE_OTHER): Payer: Medicare Other | Admitting: Family Medicine

## 2023-01-08 VITALS — BP 174/72 | HR 67 | Ht 65.0 in | Wt 147.0 lb

## 2023-01-08 DIAGNOSIS — Z0000 Encounter for general adult medical examination without abnormal findings: Secondary | ICD-10-CM | POA: Diagnosis not present

## 2023-01-08 NOTE — Patient Instructions (Addendum)
[facility] Maintenance Summary and Written Plan of Care  [name] ,  Thank you for allowing me to perform your Medicare Annual Wellness Visit and for your ongoing commitment to your health.   Health Maintenance & Immunization History Health Maintenance  Topic Date Due   COVID-19 Vaccine (3 - Moderna risk series) 01/24/2023 (Originally 09/07/2020)   Medicare Annual Wellness (AWV)  01/09/2024   DTaP/Tdap/Td (2 - Td or Tdap) 01/27/2028   Pneumonia Vaccine 60+ Years old  Completed   INFLUENZA VACCINE  Completed   Zoster Vaccines- Shingrix  Completed   HPV VACCINES  Aged Out   Immunization History  Administered Date(s) Administered   Fluad Quad(high Dose 65+) 08/13/2021, 09/06/2022   Influenza, High Dose Seasonal PF 09/14/2017, 08/29/2018, 08/08/2019   Influenza-Unspecified 08/27/2015, 09/06/2016, 08/10/2020   Moderna Sars-Covid-2 Vaccination 07/11/2020, 08/10/2020   Pneumococcal Conjugate-13 09/16/2016   Pneumococcal Polysaccharide-23 01/26/2018   Tdap 01/26/2018   Zoster Recombinat (Shingrix) 12/11/2019, 02/23/2020    These are the patient goals that we discussed:  Goals Addressed               This Visit's Progress     Patient Stated (pt-stated)        01/08/2023 AWV Goal: Exercise for General Health  Patient will verbalize understanding of the benefits of increased physical activity: Exercising regularly is important. It will improve your overall fitness, flexibility, and endurance. Regular exercise also will improve your overall health. It can help you control your weight, reduce stress, and improve your bone density. Over the next year, patient will increase physical activity as tolerated with a goal of at least 150 minutes of moderate physical activity per week.  You can tell that you are exercising at a moderate intensity if your heart starts beating faster and you start breathing faster but can still hold a  conversation. Moderate-intensity exercise ideas include: Walking 1 mile (1.6 km) in about 15 minutes Biking Hiking Golfing Dancing Water aerobics Patient will verbalize understanding of everyday activities that increase physical activity by providing examples like the following: Yard work, such as: Sales promotion account executive Gardening Washing windows or floors Patient will be able to explain general safety guidelines for exercising:  Before you start a new exercise program, talk with your health care provider. Do not exercise so much that you hurt yourself, feel dizzy, or get very short of breath. Wear comfortable clothes and wear shoes with good support. Drink plenty of water while you exercise to prevent dehydration or heat stroke. Work out until your breathing and your heartbeat get faster.          This is a list of Health Maintenance Items that are overdue or due now: There are no preventive care reminders to display for this patient.    Orders/Referrals Placed Today: No orders of the defined types were placed in this encounter.  (Contact our referral department at [phone] if you have not spoken with someone about your referral appointment within the next 5 days)    Follow-up Plan Follow-up with Luetta Nutting, DO as planned Please let us know if you change your mind about medicare wellness visit next year. AVS printed and given to the patient.      Health Maintenance, Male Adopting a healthy lifestyle and getting preventive care are important in promoting health and wellness. Ask your health care provider about: The right schedule for you to  have regular tests and exams. Things you can do on your own to prevent diseases and keep yourself healthy. What should I know about diet, weight, and exercise? Eat a healthy diet  Eat a diet that includes plenty of vegetables, fruits, low-fat  dairy products, and lean protein. Do not eat a lot of foods that are high in solid fats, added sugars, or sodium. Maintain a healthy weight Body mass index (BMI) is a measurement that can be used to identify possible weight problems. It estimates body fat based on height and weight. Your health care provider can help determine your BMI and help you achieve or maintain a healthy weight. Get regular exercise Get regular exercise. This is one of the most important things you can do for your health. Most adults should: Exercise for at least 150 minutes each week. The exercise should increase your heart rate and make you sweat (moderate-intensity exercise). Do strengthening exercises at least twice a week. This is in addition to the moderate-intensity exercise. Spend less time sitting. Even light physical activity can be beneficial. Watch cholesterol and blood lipids Have your blood tested for lipids and cholesterol at 87 years of age, then have this test every 5 years. You may need to have your cholesterol levels checked more often if: Your lipid or cholesterol levels are high. You are older than 87 years of age. You are at high risk for heart disease. What should I know about cancer screening? Many types of cancers can be detected early and may often be prevented. Depending on your health history and family history, you may need to have cancer screening at various ages. This may include screening for: Colorectal cancer. Prostate cancer. Skin cancer. Lung cancer. What should I know about heart disease, diabetes, and high blood pressure? Blood pressure and heart disease High blood pressure causes heart disease and increases the risk of stroke. This is more likely to develop in people who have high blood pressure readings or are overweight. Talk with your health care provider about your target blood pressure readings. Have your blood pressure checked: Every 3-5 years if you are 18-39 years of  age. Every year if you are 29 years old or older. If you are between the ages of 27 and 53 and are a current or former smoker, ask your health care provider if you should have a one-time screening for abdominal aortic aneurysm (AAA). Diabetes Have regular diabetes screenings. This checks your fasting blood sugar level. Have the screening done: Once every three years after age 37 if you are at a normal weight and have a low risk for diabetes. More often and at a younger age if you are overweight or have a high risk for diabetes. What should I know about preventing infection? Hepatitis B If you have a higher risk for hepatitis B, you should be screened for this virus. Talk with your health care provider to find out if you are at risk for hepatitis B infection. Hepatitis C Blood testing is recommended for: Everyone born from [birth year] through 1965. Anyone with known risk factors for hepatitis C. Sexually transmitted infections (STIs) You should be screened each year for STIs, including gonorrhea and chlamydia, if: You are sexually active and are younger than 87 years of age. You are older than 87 years of age and your health care provider tells you that you are at risk for this type of infection. Your sexual activity has changed since you were last screened, and you are at  increased risk for chlamydia or gonorrhea. Ask your health care provider if you are at risk. Ask your health care provider about whether you are at high risk for HIV. Your health care provider may recommend a prescription medicine to help prevent HIV infection. If you choose to take medicine to prevent HIV, you should first get tested for HIV. You should then be tested every 3 months for as long as you are taking the medicine. Follow these instructions at home: Alcohol use Do not drink alcohol if your health care provider tells you not to drink. If you drink alcohol: Limit how much you have to 0-2 drinks a day. Know how much  alcohol is in your drink. In the [place] one drink equals one 12 oz bottle of beer (355 mL), one 5 oz glass of wine (148 mL), or one 1 oz glass of hard liquor (44 mL). Lifestyle Do not use any products that contain nicotine or tobacco. These products include cigarettes, chewing tobacco, and vaping devices, such as e-cigarettes. If you need help quitting, ask your health care provider. Do not use street drugs. Do not share needles. Ask your health care provider for help if you need support or information about quitting drugs. General instructions Schedule regular health, dental, and eye exams. Stay current with your vaccines. Tell your health care provider if: You often feel depressed. You have ever been abused or do not feel safe at home. Summary Adopting a healthy lifestyle and getting preventive care are important in promoting health and wellness. Follow your health care provider's instructions about healthy diet, exercising, and getting tested or screened for diseases. Follow your health care provider's instructions on monitoring your cholesterol and blood pressure. This information is not intended to replace advice given to you by your health care provider. Make sure you discuss any questions you have with your health care provider. Document Revised: 04/14/2021 Document Reviewed: 04/14/2021 Elsevier Patient Education  [place].

## 2023-01-08 NOTE — Progress Notes (Signed)
MEDICARE ANNUAL WELLNESS VISIT  01/08/2023  Subjective:  Benjamin Bryan is a 89 y.o. male patient of Benjamin Nutting, DO who had a Medicare Annual Wellness Visit today. Benjamin Bryan is Retired and lives with their spouse. he has 1 child. he reports that he is socially active and does interact with friends/family regularly. he is minimally physically active and enjoys sleeping and eating.  Patient Care Team: Benjamin Nutting, DO as PCP - General (Family Medicine) Benjamin Salines, DO as PCP - Cardiology (Cardiology)     01/08/2023    1:59 PM 01/02/2022    2:08 PM 12/04/2020   10:00 AM 12/04/2019   11:00 AM 11/28/2018   10:09 AM 09/11/2015   11:33 AM  Advanced Directives  Does Patient Have a Medical Advance Directive? No No No No No No  Does patient want to make changes to medical advance directive?    No - Patient declined    Would patient like information on creating a medical advance directive? Yes (MAU/Ambulatory/Procedural Areas - Information given) No - Patient declined No - Patient declined No - Patient declined No - Patient declined No - patient declined information    Hospital Utilization Over the Past 12 Months: # of hospitalizations or ER visits: 0 # of surgeries: 0  Review of Systems    Patient reports that his overall health is unchanged when compared to last year.  Review of Systems: History obtained from chart review and the patient  All other systems negative.  Pain Assessment Pain : No/denies pain     Current Medications & Allergies (verified) Allergies as of 01/08/2023   No Known Allergies      Medication List        Accurate as of January 08, 2023  2:30 PM. If you have any questions, ask your nurse or doctor.          AMBULATORY NON FORMULARY MEDICATION Medication Name: Blood Pressure cuff with monitor.   amLODipine 5 MG tablet Commonly known as: NORVASC Take 1 tablet by mouth once daily   carvedilol 3.125 MG tablet Commonly known as: COREG Take 1  tablet (3.125 mg total) by mouth 2 (two) times daily with a meal. Please keep scheduled appointment with cardiologist   famotidine 20 MG tablet Commonly known as: PEPCID Take 20 mg by mouth daily.   ipratropium 0.06 % nasal spray Commonly known as: ATROVENT USE 2 SPRAY(S) IN EACH NOSTRIL EVERY 4 HOURS AS NEEDED   levothyroxine 75 MCG tablet Commonly known as: SYNTHROID Take 1 tablet by mouth once daily   tacrolimus 0.1 % ointment Commonly known as: PROTOPIC Apply topically 2 (two) times daily.        History (reviewed): Past Medical History:  Diagnosis Date   Atherosclerosis of aortic bifurcation and common iliac arteries ([place]) 01/26/2018   Seen on xray 01/26/18. Normal ABI study 2018   Cancer [facility])    Basal cell carcinoma 11/18/2020   COPD (chronic obstructive pulmonary disease) ([place]) 10/03/2015   Frequent PVCs 09/11/2015   History of prostate cancer 10/03/2015   Radical prostectomy     History of pulmonary embolus (PE) 10/07/2018   Late October 2019 small clot burden   History of TIA (transient ischemic attack) 09/11/2015   Hyperlipidemia    Hypertension    Hypertensive kidney disease with stage 3 chronic kidney disease ([place]) 09/11/2015   Hypertensive urgency 08/30/2019   Hypothyroidism 09/11/2015   Iron deficiency anemia 12/09/2018   Neuralgia, neuritis, and radiculitis, unspecified 03/20/2014   Neurologic  orthostatic hypotension ([place]) 09/12/2014   Shingles    Thyroid disease    Past Surgical History:  Procedure Laterality Date   radical prostectomy  1994   removed section of intestine  08/24/2005   polyp   SKIN CANCER EXCISION Left 11/18/2020   from the patient; done at Dr. Darliss Ridgel office; december 2021; removed from left arm and neck   Family History  Problem Relation Age of Onset   Prostate cancer Father    Stroke Father    Social History   Socioeconomic History   Marital status: Married    Spouse name: Romie Minus   Number of children: 1   Years of education: 12    Highest education level: 12th grade  Occupational History   Occupation: retired    Comment: management  Tobacco Use   Smoking status: Former    Types: Cigarettes    Quit date: 12/11/1984    Years since quitting: 38.1   Smokeless tobacco: Never  Vaping Use   Vaping Use: Never used  Substance and Sexual Activity   Alcohol use: Not Currently   Drug use: No   Sexual activity: Not Currently    Partners: Female  Other Topics Concern   Not on file  Social History Narrative   Lives with his wife. He has one child, who lives at [facility]. He does have a niece who lives close by. He enjoys eating and sleeping.   Social Determinants of Health   Financial Resource Strain: Low Risk  (01/08/2023)   Overall Financial Resource Strain (CARDIA)    Difficulty of Paying Living Expenses: Not hard at all  Food Insecurity: No Food Insecurity (01/08/2023)   Hunger Vital Sign    Worried About Running Out of Food in the Last Year: Never true    Ran Out of Food in the Last Year: Never true  Transportation Needs: No Transportation Needs (01/08/2023)   PRAPARE - Hydrologist (Medical): No    Lack of Transportation (Non-Medical): No  Physical Activity: Inactive (01/08/2023)   Exercise Vital Sign    Days of Exercise per Week: 0 days    Minutes of Exercise per Session: 0 min  Stress: No Stress Concern Present (01/08/2023)   [place]    Feeling of Stress : Not at all  Social Connections: Socially Isolated (01/08/2023)   Social Connection and Isolation Panel [NHANES]    Frequency of Communication with Friends and Family: Never    Frequency of Social Gatherings with Friends and Family: More than three times a week    Attends Religious Services: Never    Marine scientist or Organizations: No    Attends Archivist Meetings: Never    Marital Status: Widowed    Activities of Daily Living    01/08/2023     2:07 PM  In your present state of health, do you have any difficulty performing the following activities:  Hearing? 1  Comment no hearing aids.. bilatereal hearing loss  Vision? 1  Comment vision loss in left eye  Difficulty concentrating or making decisions? 0  Walking or climbing stairs? 0  Dressing or bathing? 0  Doing errands, shopping? 0  Preparing Food and eating ? N  Using the Toilet? N  In the past six months, have you accidently leaked urine? Y  Comment sometimes; he wears a pad  Do you have problems with loss of bowel control? N  Managing  your Medications? N  Managing your Finances? N  Housekeeping or managing your Housekeeping? N    Patient Education/Literacy How often do you need to have someone help you when you read instructions, pamphlets, or other written materials from your doctor or pharmacy?: 1 - Never What is the last grade level you completed in school?: 12th grade  Exercise Current Exercise Habits: The patient does not participate in regular exercise at present, Exercise limited by: None identified  Diet Patient reports consuming 3 meals a day and 0 snack(s) a day Patient reports that his primary diet is: Regular Patient reports that she does have regular access to food.   Depression Screen    01/08/2023    2:05 PM 11/17/2022   10:32 AM 05/11/2022   10:16 AM 01/02/2022    2:10 PM 12/04/2020   10:09 AM 12/04/2019   11:02 AM 11/28/2018   10:14 AM  PHQ 2/9 Scores  PHQ - 2 Score '2 6 2 '$ 0 0 0 0  PHQ- 9 Score '5 14 4  '$ 0       Fall Risk    01/08/2023    2:02 PM 01/02/2022    2:10 PM 12/04/2020   10:08 AM 12/04/2019   11:01 AM 04/07/2019    9:05 AM  Fall Risk   Falls in the past year? 1 0 0 0 0  Number falls in past yr: 1 0 0 0 0  Injury with Fall? 1 0 0 0 0  Risk for fall due to : History of fall(s) No Fall Risks No Fall Risks    Follow up Falls prevention discussed;Falls evaluation completed;Education provided Falls evaluation completed;Education  provided Falls evaluation completed Falls prevention discussed Falls evaluation completed     Objective:   BP (!) 174/72 (BP Location: Left Arm, Patient Position: Sitting, Cuff Size: Normal)   Pulse 67   Ht '5\' 5"'$  (1.651 m)   Wt 147 lb (66.7 kg)   SpO2 99%   BMI 24.46 kg/m   Last Weight  Most recent update: 01/08/2023  1:54 PM    Weight  66.7 kg (147 lb)             Body mass index is 24.46 kg/m.  Hearing/Vision  Nicolas did  have difficulty with hearing/understanding during the face-to-face interview [place] did not have difficulty with his vision during the face-to-face interview Reports that he has had a formal eye exam by an eye care professional within the past year Reports that he has not had a formal hearing evaluation within the past year  Cognitive Function:    01/08/2023    2:10 PM 01/02/2022    2:17 PM 12/04/2020   10:05 AM 12/04/2019   11:07 AM 11/28/2018   10:19 AM  6CIT Screen  What Year? 0 points 0 points 0 points 0 points 0 points  What month? 0 points 0 points 0 points 0 points 0 points  What time? 0 points 0 points 0 points 0 points 0 points  Count back from 20 0 points 0 points 0 points 0 points 0 points  Months in reverse 0 points 0 points 0 points 0 points 0 points  Repeat phrase 0 points 0 points 4 points 2 points 2 points  Total Score 0 points 0 points 4 points 2 points 2 points    Normal Cognitive Function Screening: Yes (Normal:0-7, Significant for Dysfunction: >8)  Immunization & Health Maintenance Record Immunization History  Administered Date(s) Administered   Fluad Quad(high Dose 65+)  08/13/2021, 09/06/2022   Influenza, High Dose Seasonal PF 09/14/2017, 08/29/2018, 08/08/2019   Influenza-Unspecified 08/27/2015, 09/06/2016, 08/10/2020   Moderna Sars-Covid-2 Vaccination 07/11/2020, 08/10/2020   Pneumococcal Conjugate-13 09/16/2016   Pneumococcal Polysaccharide-23 01/26/2018   Tdap 01/26/2018   Zoster Recombinat (Shingrix) 12/11/2019,  02/23/2020    Health Maintenance  Topic Date Due   COVID-19 Vaccine (3 - Moderna risk series) 01/24/2023 (Originally 09/07/2020)   Medicare Annual Wellness (AWV)  01/09/2024   DTaP/Tdap/Td (2 - Td or Tdap) 01/27/2028   Pneumonia Vaccine 42+ Years old  Completed   INFLUENZA VACCINE  Completed   Zoster Vaccines- Shingrix  Completed   HPV VACCINES  Aged Out       Assessment  This is a routine wellness examination for [employer].  Health Maintenance: Due or Overdue There are no preventive care reminders to display for this patient.   Kathryne Eriksson Vanzee does not need a referral for Community Assistance: Care Management:   no Social Work:    no Prescription Assistance:  no Nutrition/Diabetes Education:  no   Plan:  Personalized Goals  Goals Addressed               This Visit's Progress     Patient Stated (pt-stated)        01/08/2023 AWV Goal: Exercise for General Health  Patient will verbalize understanding of the benefits of increased physical activity: Exercising regularly is important. It will improve your overall fitness, flexibility, and endurance. Regular exercise also will improve your overall health. It can help you control your weight, reduce stress, and improve your bone density. Over the next year, patient will increase physical activity as tolerated with a goal of at least 150 minutes of moderate physical activity per week.  You can tell that you are exercising at a moderate intensity if your heart starts beating faster and you start breathing faster but can still hold a conversation. Moderate-intensity exercise ideas include: Walking 1 mile (1.6 km) in about 15 minutes Biking Hiking Golfing Dancing Water aerobics Patient will verbalize understanding of everyday activities that increase physical activity by providing examples like the following: Yard work, such as: Therapist, sports Gardening Washing windows or floors Patient will be able to explain general safety guidelines for exercising:  Before you start a new exercise program, talk with your health care provider. Do not exercise so much that you hurt yourself, feel dizzy, or get very short of breath. Wear comfortable clothes and wear shoes with good support. Drink plenty of water while you exercise to prevent dehydration or heat stroke. Work out until your breathing and your heartbeat get faster.        Personalized Health Maintenance & Screening Recommendations  There are no preventive care reminders to display for this patient.  Lung Cancer Screening Recommended: no (Low Dose CT Chest recommended if Age 60-80 years, 30 pack-year currently smoking OR have quit w/in past 15 years) Hepatitis C Screening recommended: no HIV Screening recommended: no  Advanced Directives: Written information was not given per the patient's request.  Referrals & Orders No orders of the defined types were placed in this encounter.   Follow-up Plan Follow-up with Benjamin Nutting, DO as planned Please let us know if you change your mind about medicare wellness visit next year. AVS printed and given to the patient.   I have personally reviewed and noted the following in the patient's chart:  Medical and social history Use of alcohol, tobacco or illicit drugs  Current medications and supplements Functional ability and status Nutritional status Physical activity Advanced directives List of other physicians Hospitalizations, surgeries, and ER visits in previous 12 months Vitals Screenings to include cognitive, depression, and falls Referrals and appointments  In addition, I have reviewed and discussed with patient certain preventive protocols, quality metrics, and best practice recommendations. A written personalized care plan for preventive services as well as general preventive health  recommendations were provided to patient.     Tinnie Gens, RN BSN  01/08/2023

## 2023-01-08 NOTE — Telephone Encounter (Signed)
Patient called back and stated that he checked his BP at home; it was 137/78. He just wanted to let us know.

## 2023-01-12 ENCOUNTER — Other Ambulatory Visit: Payer: Self-pay | Admitting: Family Medicine

## 2023-01-26 ENCOUNTER — Other Ambulatory Visit: Payer: Self-pay | Admitting: Family Medicine

## 2023-01-30 ENCOUNTER — Other Ambulatory Visit: Payer: Self-pay | Admitting: Cardiology

## 2023-02-09 ENCOUNTER — Encounter: Payer: Self-pay | Admitting: Family Medicine

## 2023-02-09 ENCOUNTER — Ambulatory Visit (INDEPENDENT_AMBULATORY_CARE_PROVIDER_SITE_OTHER): Payer: Medicare Other | Admitting: Family Medicine

## 2023-02-09 VITALS — BP 170/84 | HR 67 | Ht 65.0 in | Wt 147.0 lb

## 2023-02-09 DIAGNOSIS — I10 Essential (primary) hypertension: Secondary | ICD-10-CM

## 2023-02-09 NOTE — Patient Instructions (Signed)
Continue to monitor blood pressure at home: If BP is >155/90 add 2.'5mg'$  (1/2 tab) of amlodipine back on.  See me again in 6 months or sooner if needed.

## 2023-02-09 NOTE — Progress Notes (Signed)
Benjamin Bryan - 89 y.o. male MRN [id number]  Date of birth: 05-16-1928  Subjective Chief Complaint  Patient presents with   Hypertension    HPI Benjamin Bryan is a 89 y.o. male here today for follow-up visit.  Reports he is doing well at this time.  He is prescribed carvedilol and amlodipine for management of hypertension.  He reports that he has discontinued amlodipine due to orthostasis when getting out of bed.  Blood pressure at home have looked pretty good even with stopping amlodipine.  He had prefer to stay off of this.  He has not had new symptoms including chest pain, shortness of breath, palpitations, headaches or vision changes.  ROS:  A comprehensive ROS was completed and negative except as noted per HPI  No Known Allergies  Past Medical History:  Diagnosis Date   Atherosclerosis of aortic bifurcation and common iliac arteries ([place]) 01/26/2018   Seen on xray 01/26/18. Normal ABI study 2018   Cancer [facility])    Basal cell carcinoma 11/18/2020   COPD (chronic obstructive pulmonary disease) ([place]) 10/03/2015   Frequent PVCs 09/11/2015   History of prostate cancer 10/03/2015   Radical prostectomy     History of pulmonary embolus (PE) 10/07/2018   Late October 2019 small clot burden   History of TIA (transient ischemic attack) 09/11/2015   Hyperlipidemia    Hypertension    Hypertensive kidney disease with stage 3 chronic kidney disease ([place]) 09/11/2015   Hypertensive urgency 08/30/2019   Hypothyroidism 09/11/2015   Iron deficiency anemia 12/09/2018   Neuralgia, neuritis, and radiculitis, unspecified 03/20/2014   Neurologic orthostatic hypotension ([place]) 09/12/2014   Shingles    Thyroid disease     Past Surgical History:  Procedure Laterality Date   radical prostectomy  1994   removed section of intestine  08/24/2005   polyp   SKIN CANCER EXCISION Left 11/18/2020   from the patient; done at Dr. Darliss Ridgel office; december 2021; removed from left arm and neck    Social History    Socioeconomic History   Marital status: Married    Spouse name: Romie Minus   Number of children: 1   Years of education: 12   Highest education level: 12th grade  Occupational History   Occupation: retired    Comment: management  Tobacco Use   Smoking status: Former    Types: Cigarettes    Quit date: 12/11/1984    Years since quitting: 38.1   Smokeless tobacco: Never  Vaping Use   Vaping Use: Never used  Substance and Sexual Activity   Alcohol use: Not Currently   Drug use: No   Sexual activity: Not Currently    Partners: Female  Other Topics Concern   Not on file  Social History Narrative   Lives with his wife. He has one child, who lives at [facility]. He does have a niece who lives close by. He enjoys eating and sleeping.   Social Determinants of Health   Financial Resource Strain: Low Risk  (01/08/2023)   Overall Financial Resource Strain (CARDIA)    Difficulty of Paying Living Expenses: Not hard at all  Food Insecurity: No Food Insecurity (01/08/2023)   Hunger Vital Sign    Worried About Running Out of Food in the Last Year: Never true    Ran Out of Food in the Last Year: Never true  Transportation Needs: No Transportation Needs (01/08/2023)   PRAPARE - Transportation    Lack of Transportation (Medical): No    Lack  of Transportation (Non-Medical): No  Physical Activity: Inactive (01/08/2023)   Exercise Vital Sign    Days of Exercise per Week: 0 days    Minutes of Exercise per Session: 0 min  Stress: No Stress Concern Present (01/08/2023)   [place]    Feeling of Stress : Not at all  Social Connections: Socially Isolated (01/08/2023)   Social Connection and Isolation Panel [NHANES]    Frequency of Communication with Friends and Family: Never    Frequency of Social Gatherings with Friends and Family: More than three times a week    Attends Religious Services: Never    Marine scientist or Organizations:  No    Attends Archivist Meetings: Never    Marital Status: Widowed    Family History  Problem Relation Age of Onset   Prostate cancer Father    Stroke Father     Health Maintenance  Topic Date Due   COVID-19 Vaccine (3 - Moderna risk series) 08/08/2023 (Originally 09/07/2020)   Medicare Annual Wellness (AWV)  01/09/2024   DTaP/Tdap/Td (2 - Td or Tdap) 01/27/2028   Pneumonia Vaccine 52+ Years old  Completed   INFLUENZA VACCINE  Completed   Zoster Vaccines- Shingrix  Completed   HPV VACCINES  Aged Out     ----------------------------------------------------------------------------------------------------------------------------------------------------------------------------------------------------------------- Physical Exam BP (!) 170/84 (BP Location: Left Arm, Patient Position: Sitting, Cuff Size: Normal) Comment (BP Location): Manual  Pulse 67   Ht '5\' 5"'$  (1.651 m)   Wt 147 lb (66.7 kg)   SpO2 99%   BMI 24.46 kg/m   Physical Exam Constitutional:      Appearance: Normal appearance.  HENT:     Head: Normocephalic and atraumatic.  Eyes:     General: No scleral icterus. Cardiovascular:     Rate and Rhythm: Normal rate and regular rhythm.  Pulmonary:     Effort: Pulmonary effort is normal.     Breath sounds: Normal breath sounds.  Neurological:     General: No focal deficit present.     Mental Status: He is alert.  Psychiatric:        Mood and Affect: Mood normal.        Behavior: Behavior normal.     ------------------------------------------------------------------------------------------------------------------------------------------------------------------------------------------------------------------- Assessment and Plan  Essential hypertension Blood pressure is elevated in the clinic today.  He is checking his blood pressures at home twice a day each day with fairly good readings at home.  Had orthostasis with amlodipine.  He will continue  carvedilol at current strength.  Given parameters to restart amlodipine.   No orders of the defined types were placed in this encounter.   Return in about 6 months (around 08/12/2023) for HTN.    This visit occurred during the SARS-CoV-2 public health emergency.  Safety protocols were in place, including screening questions prior to the visit, additional usage of staff PPE, and extensive cleaning of exam room while observing appropriate contact time as indicated for disinfecting solutions.

## 2023-02-09 NOTE — Assessment & Plan Note (Signed)
Blood pressure is elevated in the clinic today.  He is checking his blood pressures at home twice a day each day with fairly good readings at home.  Had orthostasis with amlodipine.  He will continue carvedilol at current strength.  Given parameters to restart amlodipine.

## 2023-02-23 ENCOUNTER — Other Ambulatory Visit: Payer: Self-pay | Admitting: Family Medicine

## 2023-02-27 ENCOUNTER — Other Ambulatory Visit: Payer: Self-pay | Admitting: Family Medicine

## 2023-02-27 DIAGNOSIS — E079 Disorder of thyroid, unspecified: Secondary | ICD-10-CM

## 2023-03-02 ENCOUNTER — Other Ambulatory Visit: Payer: Self-pay | Admitting: Cardiology

## 2023-03-17 ENCOUNTER — Other Ambulatory Visit: Payer: Self-pay | Admitting: Cardiology

## 2023-03-24 ENCOUNTER — Other Ambulatory Visit: Payer: Self-pay | Admitting: Cardiology

## 2023-04-01 ENCOUNTER — Other Ambulatory Visit: Payer: Self-pay | Admitting: Cardiology

## 2023-04-05 ENCOUNTER — Other Ambulatory Visit: Payer: Self-pay | Admitting: Family Medicine

## 2023-04-05 MED ORDER — CARVEDILOL 3.125 MG PO TABS: ORAL_TABLET | ORAL | 0 refills | Status: DC

## 2023-04-09 ENCOUNTER — Telehealth: Payer: Self-pay | Admitting: Cardiology

## 2023-04-09 MED ORDER — CARVEDILOL 3.125 MG PO TABS: ORAL_TABLET | ORAL | 1 refills | Status: DC

## 2023-04-09 NOTE — Telephone Encounter (Signed)
*  STAT* If patient is at the pharmacy, call can be transferred to refill team.   1. Which medications need to be refilled? (please list name of each medication and dose if known) carvedilol (COREG) 3.125 MG tablet   2. Which pharmacy/location (including street and city if local pharmacy) is medication to be sent to?[facility] - [place], [place] - [address]   3. Do they need a 30 day or 90 day supply? 90 Day supply  Pt is is currently out of medication and is scheduled for June 18th.

## 2023-04-09 NOTE — Telephone Encounter (Signed)
Pt's medication was sent to pt's pharmacy as requested. Confirmation received.  °

## 2023-05-10 ENCOUNTER — Other Ambulatory Visit: Payer: Self-pay | Admitting: Family Medicine

## 2023-05-23 NOTE — Progress Notes (Unsigned)
Cardiology Clinic Note   Patient Name: Benjamin Bryan Date of Encounter: 05/25/2023  Primary Care Provider:  Everrett Coombe, DO Primary Cardiologist:  Thomasene Ripple, DO  Patient Profile    Benjamin Bryan 89 year old male presents to the clinic today for follow-up evaluation of his PVCs and essential hypertension.  Past Medical History    Past Medical History:  Diagnosis Date   Atherosclerosis of aortic bifurcation and common iliac arteries (HCC) 01/26/2018   Seen on xray 01/26/18. Normal ABI study 2018   Cancer [facility])    Basal cell carcinoma 11/18/2020   COPD (chronic obstructive pulmonary disease) (HCC) 10/03/2015   Frequent PVCs 09/11/2015   History of prostate cancer 10/03/2015   Radical prostectomy     History of pulmonary embolus (PE) 10/07/2018   Late October 2019 small clot burden   History of TIA (transient ischemic attack) 09/11/2015   Hyperlipidemia    Hypertension    Hypertensive kidney disease with stage 3 chronic kidney disease (HCC) 09/11/2015   Hypertensive urgency 08/30/2019   Hypothyroidism 09/11/2015   Iron deficiency anemia 12/09/2018   Neuralgia, neuritis, and radiculitis, unspecified 03/20/2014   Neurologic orthostatic hypotension (HCC) 09/12/2014   Shingles    Thyroid disease    Past Surgical History:  Procedure Laterality Date   radical prostectomy  1994   removed section of intestine  08/24/2005   polyp   SKIN CANCER EXCISION Left 11/18/2020   from the patient; done at Dr. Lonn Georgia office; december 2021; removed from left arm and neck    Allergies  No Known Allergies  History of Present Illness    Benjamin Bryan has a PMH of essential hypertension, PVCs, CKD stage IIIa, and hyperlipidemia.  He was seen in follow-up by Dr. Servando Salina on 04/29/2021.  During that time he had been referred by his PCP.  His PCP had noted elevated blood pressure and amlodipine has been added to his medication regimen.  He reported some dizziness with head movement which had  improved.  He had no cardiac complaints.  His blood pressure was noted to be 134/84.  His amlodipine and carvedilol were continued.  Follow-up was planned for 1 year.  He presents to the clinic today for follow-up evaluation and states he has had some increased fatigue.  He is not very physically active.  His blood pressures are well-controlled.  He denies cardiac symptoms.  We reviewed his medications and he expressed understanding..  Today he denies chest pain, shortness of breath, lower extremity edema, fatigue, palpitations, melena, hematuria, hemoptysis, diaphoresis, weakness, presyncope, syncope, orthopnea, and PND.    Home Medications    Prior to Admission medications   Medication Sig Start Date End Date Taking? Authorizing Provider  AMBULATORY NON FORMULARY MEDICATION Medication Name: Blood Pressure cuff with monitor. 12/12/20   [name], NP  amLODipine (NORVASC) 5 MG tablet Take 1 tablet by mouth once daily 01/26/23   Everrett Coombe, DO  carvedilol (COREG) 3.125 MG tablet TAKE 1 TABLET BY MOUTH TWICE DAILY WITH A MEAL . 04/09/23   Tobb, Kardie, DO  famotidine (PEPCID) 20 MG tablet Take 20 mg by mouth daily.    [provider]  ipratropium (ATROVENT) 0.06 % nasal spray USE 2 SPRAY(S) IN EACH NOSTRIL EVERY 4 HOURS AS NEEDED 05/11/23   Everrett Coombe, DO  levothyroxine (SYNTHROID) 75 MCG tablet Take 1 tablet by mouth once daily 03/02/23   Everrett Coombe, DO  tacrolimus (PROTOPIC) 0.1 % ointment Apply topically 2 (two) times  daily. 07/29/22   [provider]    Family History    Family History  Problem Relation Age of Onset   Prostate cancer Father    Stroke Father    He indicated that his mother is deceased. He indicated that the status of his father is unknown.  Social History    Social History   Socioeconomic History   Marital status: Married    Spouse name: Benjamin Bryan   Number of children: 1   Years of education: 12   Highest education level: 12th grade   Occupational History   Occupation: retired    Comment: management  Tobacco Use   Smoking status: Former    Types: Cigarettes    Quit date: 12/11/1984    Years since quitting: 38.4   Smokeless tobacco: Never  Vaping Use   Vaping Use: Never used  Substance and Sexual Activity   Alcohol use: Not Currently   Drug use: No   Sexual activity: Not Currently    Partners: Female  Other Topics Concern   Not on file  Social History Narrative   Lives with his wife. He has one child, who lives at [facility]. He does have a niece who lives close by. He enjoys eating and sleeping.   Social Determinants of Health   Financial Resource Strain: Low Risk  (01/08/2023)   Overall Financial Resource Strain (CARDIA)    Difficulty of Paying Living Expenses: Not hard at all  Food Insecurity: No Food Insecurity (01/08/2023)   Hunger Vital Sign    Worried About Running Out of Food in the Last Year: Never true    Ran Out of Food in the Last Year: Never true  Transportation Needs: No Transportation Needs (01/08/2023)   PRAPARE - Administrator, Civil Service (Medical): No    Lack of Transportation (Non-Medical): No  Physical Activity: Inactive (01/08/2023)   Exercise Vital Sign    Days of Exercise per Week: 0 days    Minutes of Exercise per Session: 0 min  Stress: No Stress Concern Present (01/08/2023)   [employer] of Occupational Health - Occupational Stress Questionnaire    Feeling of Stress : Not at all  Social Connections: Socially Isolated (01/08/2023)   Social Connection and Isolation Panel [NHANES]    Frequency of Communication with Friends and Family: Never    Frequency of Social Gatherings with Friends and Family: More than three times a week    Attends Religious Services: Never    Database administrator or Organizations: No    Attends Banker Meetings: Never    Marital Status: Widowed  Intimate Partner Violence: Not At Risk (01/08/2023)   Humiliation, Afraid, Rape, and  Kick questionnaire    Fear of Current or Ex-Partner: No    Emotionally Abused: No    Physically Abused: No    Sexually Abused: No     Review of Systems    General:  No chills, fever, night sweats or weight changes.  Cardiovascular:  No chest pain, dyspnea on exertion, edema, orthopnea, palpitations, paroxysmal nocturnal dyspnea. Dermatological: No rash, lesions/masses Respiratory: No cough, dyspnea Urologic: No hematuria, dysuria Abdominal:   No nausea, vomiting, diarrhea, bright red blood per rectum, melena, or hematemesis Neurologic:  No visual changes, wkns, changes in mental status. All other systems reviewed and are otherwise negative except as noted above.  Physical Exam    VS:  BP 132/74 (BP Location: Left Arm, Patient Position: Sitting, Cuff Size: Normal)  Pulse 71   Ht 5\' 5"  (1.651 m)   Wt 144 lb 9.6 oz (65.6 kg)   SpO2 98%   BMI 24.06 kg/m  , BMI Body mass index is 24.06 kg/m. GEN: Well nourished, well developed, in no acute distress. HEENT: normal. Neck: Supple, no JVD, carotid bruits, or masses. Cardiac: RRR, no murmurs, rubs, or gallops. No clubbing, cyanosis, edema.  Radials/DP/PT 2+ and equal bilaterally.  Respiratory:  Respirations regular and unlabored, clear to auscultation bilaterally. GI: Soft, nontender, nondistended, BS + x 4. MS: no deformity or atrophy. Skin: warm and dry, no rash. Neuro:  Strength and sensation are intact. Psych: Normal affect.  Accessory Clinical Findings    Recent Labs: 11/19/2022: ALT 11; BUN 21; Creat 1.43; Hemoglobin 13.8; Platelets 310; Potassium 4.6; Sodium 141; TSH 3.26   Recent Lipid Panel    Component Value Date/Time   CHOL 195 11/19/2022 0854   TRIG 66 11/19/2022 0854   HDL 60 11/19/2022 0854   CHOLHDL 3.3 11/19/2022 0854   VLDL 18 09/16/2016 0831   LDLCALC 119 (H) 11/19/2022 0854         ECG personally reviewed by me today- shows normal sinus rhythm septal infarct undetermined age 46 bpm - No acute  changes   Echocardiogram 12/03/2022   IMPRESSIONS     1. Left ventricular ejection fraction, by estimation, is 55 to 60%. The  left ventricle has normal function. The left ventricle has no regional  wall motion abnormalities. There is mild asymmetric left ventricular  hypertrophy of the basal-septal segment.  Left ventricular diastolic parameters are consistent with Grade I  diastolic dysfunction (impaired relaxation).   2. Right ventricular systolic function is normal. The right ventricular  size is normal. Tricuspid regurgitation signal is inadequate for assessing  PA pressure.   3. The mitral valve is grossly normal. Trivial mitral valve  regurgitation.   4. The aortic valve is tricuspid. There is mild thickening of the aortic  valve. Aortic valve regurgitation is trivial. Aortic valve sclerosis is  present, with no evidence of aortic valve stenosis.   5. The inferior vena cava is normal in size with greater than 50%  respiratory variability, suggesting right atrial pressure of 3 mmHg.   Comparison(s): No significant change from prior study.   FINDINGS   Left Ventricle: Left ventricular ejection fraction, by estimation, is 55  to 60%. The left ventricle has normal function. The left ventricle has no  regional wall motion abnormalities. The left ventricular internal cavity  size was normal in size. There is   mild asymmetric left ventricular hypertrophy of the basal-septal segment.  Left ventricular diastolic parameters are consistent with Grade I  diastolic dysfunction (impaired relaxation).   Right Ventricle: The right ventricular size is normal. No increase in  right ventricular wall thickness. Right ventricular systolic function is  normal. Tricuspid regurgitation signal is inadequate for assessing PA  pressure.   Left Atrium: Left atrial size was normal in size.   Right Atrium: Right atrial size was normal in size.   Pericardium: There is no evidence of  pericardial effusion.   Mitral Valve: The mitral valve is grossly normal. Trivial mitral valve  regurgitation.   Tricuspid Valve: The tricuspid valve is normal in structure. Tricuspid  valve regurgitation is trivial.   Aortic Valve: The aortic valve is tricuspid. There is mild thickening of  the aortic valve. Aortic valve regurgitation is trivial. Aortic valve  sclerosis is present, with no evidence of aortic valve stenosis.  Pulmonic Valve: The pulmonic valve was normal in structure. Pulmonic valve  regurgitation is mild.   Aorta: The aortic root is normal in size and structure.   Venous: The inferior vena cava is normal in size with greater than 50%  respiratory variability, suggesting right atrial pressure of 3 mmHg.   IAS/Shunts: The atrial septum is grossly normal.    Assessment & Plan   1.  Essential hypertension-BP today 132/74. Maintain blood pressure log Heart healthy low-sodium diet-salty 6 given Maintain physical activity as tolerated Continue amlodipine, carvedilol Avoid secondary causes of hypertension  PVCs-EKG today shows normal sinus rhythm send infarct undetermined age 49 bpm.  Asymptomatic. Continue carvedilol Avoid triggers caffeine, chocolate, dehydration etc.  Hyperlipidemia-LDL 119 on 11/19/2022. High-fiber diet Maintain physical activity as tolerated Follows with PCP  Disposition: Follow-up with Dr. Servando Salina or me in 12 months.   Thomasene Ripple. Sonji Starkes NP-C     05/25/2023, 1:42 PM [facility] [address] Office [phone] Fax [phone]    I spent 14 minutes examining this patient, reviewing medications, and using patient centered shared decision making involving her cardiac care.  Prior to her visit I spent greater than 20 minutes reviewing her past medical history,  medications, and prior cardiac tests.

## 2023-05-25 ENCOUNTER — Encounter: Payer: Self-pay | Admitting: General Practice

## 2023-05-25 ENCOUNTER — Ambulatory Visit: Payer: Medicare Other | Attending: General Practice | Admitting: General Practice

## 2023-05-25 VITALS — BP 132/74 | HR 71 | Ht 65.0 in | Wt 144.6 lb

## 2023-05-25 DIAGNOSIS — I10 Essential (primary) hypertension: Secondary | ICD-10-CM

## 2023-05-25 DIAGNOSIS — E782 Mixed hyperlipidemia: Secondary | ICD-10-CM | POA: Diagnosis not present

## 2023-05-25 DIAGNOSIS — I493 Ventricular premature depolarization: Secondary | ICD-10-CM | POA: Diagnosis not present

## 2023-05-25 MED ORDER — CARVEDILOL 3.125 MG PO TABS: ORAL_TABLET | ORAL | 3 refills | Status: DC

## 2023-05-25 MED ORDER — AMLODIPINE BESYLATE 5 MG PO TABS: 2.5000 mg | ORAL_TABLET | Freq: Every day | ORAL | 3 refills | Status: DC

## 2023-05-25 NOTE — Patient Instructions (Signed)
Medication Instructions:  The current medical regimen is effective;  continue present plan and medications as directed. Please refer to the Current Medication list given to you today.  *If you need a refill on your cardiac medications before your next appointment, please call your pharmacy*  Lab Work: NONE ordered at this time of appointment   If you have labs (blood work) drawn today and your tests are completely normal, you will receive your results only by:  MyChart Message (if you have MyChart) OR A paper copy in the mail If you have any lab test that is abnormal or we need to change your treatment, we will call you to review the results.  Other Instructions INCREASE YOUR WALKING TAKE AND LOG YOUR BLOOD PRESSURE INCREASE PHYSICAL ACTIVITY AS TOLERATED PLEASE READ AND FOLLOW ATTACHED  SALTY 6  Follow-Up: At [facility], you and your health needs are our priority.  As part of our continuing mission to provide you with exceptional heart care, we have created designated Provider Care Teams.  These Care Teams include your primary Cardiologist (physician) and Advanced Practice Providers (APPs -  Physician Assistants and Nurse Practitioners) who all work together to provide you with the care you need, when you need it.  We recommend signing up for the patient portal called "MyChart".  Sign up information is provided on this After Visit Summary.  MyChart is used to connect with patients for Virtual Visits (Telemedicine).  Patients are able to view lab/test results, encounter notes, upcoming appointments, etc.  Non-urgent messages can be sent to your provider as well.   To learn more about what you can do with MyChart, go to [email or web address].    Your next appointment:   9-12 month(s)  Provider:   Thomasene Ripple, DO

## 2023-05-26 ENCOUNTER — Other Ambulatory Visit: Payer: Self-pay | Admitting: Family Medicine

## 2023-05-26 DIAGNOSIS — E079 Disorder of thyroid, unspecified: Secondary | ICD-10-CM

## 2023-06-21 DIAGNOSIS — D692 Other nonthrombocytopenic purpura: Secondary | ICD-10-CM | POA: Diagnosis not present

## 2023-06-21 DIAGNOSIS — L218 Other seborrheic dermatitis: Secondary | ICD-10-CM | POA: Diagnosis not present

## 2023-06-21 DIAGNOSIS — L821 Other seborrheic keratosis: Secondary | ICD-10-CM | POA: Diagnosis not present

## 2023-06-21 DIAGNOSIS — Z85828 Personal history of other malignant neoplasm of skin: Secondary | ICD-10-CM | POA: Diagnosis not present

## 2023-06-21 DIAGNOSIS — C44612 Basal cell carcinoma of skin of right upper limb, including shoulder: Secondary | ICD-10-CM | POA: Diagnosis not present

## 2023-06-21 DIAGNOSIS — Z129 Encounter for screening for malignant neoplasm, site unspecified: Secondary | ICD-10-CM | POA: Diagnosis not present

## 2023-06-21 DIAGNOSIS — C44319 Basal cell carcinoma of skin of other parts of face: Secondary | ICD-10-CM | POA: Diagnosis not present

## 2023-06-23 ENCOUNTER — Encounter: Payer: Self-pay | Admitting: Family Medicine

## 2023-06-23 ENCOUNTER — Ambulatory Visit: Payer: Medicare Other

## 2023-06-23 ENCOUNTER — Ambulatory Visit (INDEPENDENT_AMBULATORY_CARE_PROVIDER_SITE_OTHER): Payer: Medicare Other | Admitting: Family Medicine

## 2023-06-23 VITALS — BP 128/71 | HR 97 | Ht 65.0 in | Wt 141.0 lb

## 2023-06-23 DIAGNOSIS — I129 Hypertensive chronic kidney disease with stage 1 through stage 4 chronic kidney disease, or unspecified chronic kidney disease: Secondary | ICD-10-CM | POA: Diagnosis not present

## 2023-06-23 DIAGNOSIS — R0602 Shortness of breath: Secondary | ICD-10-CM | POA: Diagnosis not present

## 2023-06-23 DIAGNOSIS — E782 Mixed hyperlipidemia: Secondary | ICD-10-CM | POA: Diagnosis not present

## 2023-06-23 DIAGNOSIS — Z79899 Other long term (current) drug therapy: Secondary | ICD-10-CM | POA: Diagnosis not present

## 2023-06-23 DIAGNOSIS — E785 Hyperlipidemia, unspecified: Secondary | ICD-10-CM | POA: Diagnosis not present

## 2023-06-23 DIAGNOSIS — I160 Hypertensive urgency: Secondary | ICD-10-CM | POA: Diagnosis not present

## 2023-06-23 DIAGNOSIS — I10 Essential (primary) hypertension: Secondary | ICD-10-CM

## 2023-06-23 DIAGNOSIS — Z86711 Personal history of pulmonary embolism: Secondary | ICD-10-CM | POA: Diagnosis not present

## 2023-06-23 DIAGNOSIS — Z4682 Encounter for fitting and adjustment of non-vascular catheter: Secondary | ICD-10-CM | POA: Diagnosis not present

## 2023-06-23 DIAGNOSIS — R0603 Acute respiratory distress: Secondary | ICD-10-CM | POA: Diagnosis not present

## 2023-06-23 DIAGNOSIS — J439 Emphysema, unspecified: Secondary | ICD-10-CM | POA: Diagnosis not present

## 2023-06-23 DIAGNOSIS — J984 Other disorders of lung: Secondary | ICD-10-CM | POA: Diagnosis not present

## 2023-06-23 DIAGNOSIS — Z7409 Other reduced mobility: Secondary | ICD-10-CM | POA: Diagnosis not present

## 2023-06-23 DIAGNOSIS — J8410 Pulmonary fibrosis, unspecified: Secondary | ICD-10-CM | POA: Diagnosis not present

## 2023-06-23 DIAGNOSIS — N183 Chronic kidney disease, stage 3 unspecified: Secondary | ICD-10-CM | POA: Diagnosis not present

## 2023-06-23 DIAGNOSIS — E039 Hypothyroidism, unspecified: Secondary | ICD-10-CM | POA: Diagnosis not present

## 2023-06-23 DIAGNOSIS — I951 Orthostatic hypotension: Secondary | ICD-10-CM | POA: Diagnosis not present

## 2023-06-23 DIAGNOSIS — R109 Unspecified abdominal pain: Secondary | ICD-10-CM

## 2023-06-23 DIAGNOSIS — R052 Subacute cough: Secondary | ICD-10-CM | POA: Diagnosis not present

## 2023-06-23 DIAGNOSIS — I700 Atherosclerosis of aorta: Secondary | ICD-10-CM | POA: Diagnosis not present

## 2023-06-23 DIAGNOSIS — I2510 Atherosclerotic heart disease of native coronary artery without angina pectoris: Secondary | ICD-10-CM | POA: Diagnosis not present

## 2023-06-23 DIAGNOSIS — J9811 Atelectasis: Secondary | ICD-10-CM | POA: Diagnosis not present

## 2023-06-23 DIAGNOSIS — J849 Interstitial pulmonary disease, unspecified: Secondary | ICD-10-CM | POA: Diagnosis not present

## 2023-06-23 DIAGNOSIS — I770 Arteriovenous fistula, acquired: Secondary | ICD-10-CM | POA: Diagnosis not present

## 2023-06-23 DIAGNOSIS — J9383 Other pneumothorax: Secondary | ICD-10-CM | POA: Diagnosis not present

## 2023-06-23 DIAGNOSIS — Z1152 Encounter for screening for COVID-19: Secondary | ICD-10-CM | POA: Diagnosis not present

## 2023-06-23 DIAGNOSIS — Z7901 Long term (current) use of anticoagulants: Secondary | ICD-10-CM | POA: Diagnosis not present

## 2023-06-23 DIAGNOSIS — J939 Pneumothorax, unspecified: Secondary | ICD-10-CM | POA: Diagnosis not present

## 2023-06-23 DIAGNOSIS — R0600 Dyspnea, unspecified: Secondary | ICD-10-CM | POA: Diagnosis not present

## 2023-06-23 DIAGNOSIS — J90 Pleural effusion, not elsewhere classified: Secondary | ICD-10-CM | POA: Diagnosis not present

## 2023-06-23 DIAGNOSIS — Z66 Do not resuscitate: Secondary | ICD-10-CM | POA: Diagnosis not present

## 2023-06-23 DIAGNOSIS — K5730 Diverticulosis of large intestine without perforation or abscess without bleeding: Secondary | ICD-10-CM | POA: Diagnosis not present

## 2023-06-23 DIAGNOSIS — I771 Stricture of artery: Secondary | ICD-10-CM | POA: Diagnosis not present

## 2023-06-23 DIAGNOSIS — Z87891 Personal history of nicotine dependence: Secondary | ICD-10-CM | POA: Diagnosis not present

## 2023-06-23 DIAGNOSIS — E860 Dehydration: Secondary | ICD-10-CM | POA: Diagnosis not present

## 2023-06-23 DIAGNOSIS — N1832 Chronic kidney disease, stage 3b: Secondary | ICD-10-CM | POA: Diagnosis not present

## 2023-06-23 DIAGNOSIS — R9431 Abnormal electrocardiogram [ECG] [EKG]: Secondary | ICD-10-CM | POA: Diagnosis not present

## 2023-06-23 DIAGNOSIS — J438 Other emphysema: Secondary | ICD-10-CM | POA: Diagnosis not present

## 2023-06-23 DIAGNOSIS — J8489 Other specified interstitial pulmonary diseases: Secondary | ICD-10-CM | POA: Diagnosis not present

## 2023-06-23 DIAGNOSIS — R911 Solitary pulmonary nodule: Secondary | ICD-10-CM | POA: Diagnosis not present

## 2023-06-23 LAB — COMPLETE METABOLIC PANEL WITH GFR
AG Ratio: 1.5 (calc) (ref 1.0–2.5)
ALT: 8 U/L — ABNORMAL LOW (ref 9–46)
AST: 12 U/L (ref 10–35)
Albumin: 4.1 g/dL (ref 3.6–5.1)
Alkaline phosphatase (APISO): 55 U/L (ref 35–144)
BUN/Creatinine Ratio: 17 (calc) (ref 6–22)
BUN: 29 mg/dL — ABNORMAL HIGH (ref 7–25)
CO2: 26 mmol/L (ref 20–32)
Calcium: 9.4 mg/dL (ref 8.6–10.3)
Chloride: 105 mmol/L (ref 98–110)
Creat: 1.74 mg/dL — ABNORMAL HIGH (ref 0.70–1.22)
Globulin: 2.7 g/dL (calc) (ref 1.9–3.7)
Glucose, Bld: 151 mg/dL — ABNORMAL HIGH (ref 65–99)
Potassium: 4.5 mmol/L (ref 3.5–5.3)
Sodium: 140 mmol/L (ref 135–146)
Total Bilirubin: 0.8 mg/dL (ref 0.2–1.2)
Total Protein: 6.8 g/dL (ref 6.1–8.1)
eGFR: 36 mL/min/{1.73_m2} — ABNORMAL LOW (ref >=60)

## 2023-06-23 LAB — CBC WITH DIFFERENTIAL/PLATELET
Absolute Monocytes: 948 cells/uL (ref 200–950)
Basophils Absolute: 52 cells/uL (ref 0–200)
Basophils Relative: 0.6 %
Eosinophils Absolute: 70 cells/uL (ref 15–500)
Eosinophils Relative: 0.8 %
HCT: 42 % (ref 38.5–50.0)
Hemoglobin: 14.1 g/dL (ref 13.2–17.1)
Lymphs Abs: 1105 cells/uL (ref 850–3900)
MCH: 31.7 pg (ref 27.0–33.0)
MCHC: 33.6 g/dL (ref 32.0–36.0)
MCV: 94.4 fL (ref 80.0–100.0)
MPV: 10.4 fL (ref 7.5–12.5)
Monocytes Relative: 10.9 %
Neutro Abs: 6525 cells/uL (ref 1500–7800)
Neutrophils Relative %: 75 %
Platelets: 290 10*3/uL (ref 140–400)
RBC: 4.45 10*6/uL (ref 4.20–5.80)
RDW: 12.2 % (ref 11.0–15.0)
Total Lymphocyte: 12.7 %
WBC: 8.7 10*3/uL (ref 3.8–10.8)

## 2023-06-23 NOTE — Progress Notes (Signed)
Clydell Sposito Dobek - 89 y.o. male MRN [id number]  Date of birth: 1928/07/11  Subjective Chief Complaint  Patient presents with   Cough   Flank Pain    HPI LAVONTAE CORNIA is a 89 year old male here today with complaint of cough and dyspnea.  He has had cough for a few months.  This is acutely worsened over the past week.  He has having some increased dyspnea especially with activity.  Cough is nonproductive.  He has had some sharp pain in his mid flank initially on the right side yesterday.  This occurred again this morning but was bilateral in nature.  His wife massaged his back and symptoms improved.  He denies fever, chills, chest pain, dizziness.  ROS:  A comprehensive ROS was completed and negative except as noted per HPI  No Known Allergies  Past Medical History:  Diagnosis Date   Atherosclerosis of aortic bifurcation and common iliac arteries (HCC) 01/26/2018   Seen on xray 01/26/18. Normal ABI study 2018   Cancer [facility])    Basal cell carcinoma 11/18/2020   COPD (chronic obstructive pulmonary disease) (HCC) 10/03/2015   Frequent PVCs 09/11/2015   History of prostate cancer 10/03/2015   Radical prostectomy     History of pulmonary embolus (PE) 10/07/2018   Late October 2019 small clot burden   History of TIA (transient ischemic attack) 09/11/2015   Hyperlipidemia    Hypertension    Hypertensive kidney disease with stage 3 chronic kidney disease (HCC) 09/11/2015   Hypertensive urgency 08/30/2019   Hypothyroidism 09/11/2015   Iron deficiency anemia 12/09/2018   Neuralgia, neuritis, and radiculitis, unspecified 03/20/2014   Neurologic orthostatic hypotension (HCC) 09/12/2014   Shingles    Thyroid disease     Past Surgical History:  Procedure Laterality Date   radical prostectomy  1994   removed section of intestine  08/24/2005   polyp   SKIN CANCER EXCISION Left 11/18/2020   from the patient; done at Dr. Lonn Georgia office; december 2021; removed from left arm and neck    Social  History   Socioeconomic History   Marital status: Married    Spouse name: [name]   Number of children: 1   Years of education: 12   Highest education level: 12th grade  Occupational History   Occupation: retired    Comment: management  Tobacco Use   Smoking status: Former    Current packs/day: 0.00    Types: Cigarettes    Quit date: 12/11/1984    Years since quitting: 38.5   Smokeless tobacco: Never  Vaping Use   Vaping status: Never Used  Substance and Sexual Activity   Alcohol use: Not Currently   Drug use: No   Sexual activity: Not Currently    Partners: Female  Other Topics Concern   Not on file  Social History Narrative   Lives with his wife. He has one child, who lives [facility]. He does have a niece who lives close by. He enjoys eating and sleeping.   Social Determinants of Health   Financial Resource Strain: Low Risk  (01/08/2023)   Overall Financial Resource Strain (CARDIA)    Difficulty of Paying Living Expenses: Not hard at all  Food Insecurity: No Food Insecurity (01/08/2023)   Hunger Vital Sign    Worried About Running Out of Food in the Last Year: Never true    Ran Out of Food in the Last Year: Never true  Transportation Needs: No Transportation Needs (01/08/2023)   PRAPARE -  Administrator, Civil Service (Medical): No    Lack of Transportation (Non-Medical): No  Physical Activity: Inactive (01/08/2023)   Exercise Vital Sign    Days of Exercise per Week: 0 days    Minutes of Exercise per Session: 0 min  Stress: No Stress Concern Present (01/08/2023)   [employer] of Occupational Health - Occupational Stress Questionnaire    Feeling of Stress : Not at all  Social Connections: Unknown (06/23/2023)   Received from [facility]   Social Network    Social Network: Not on file    Family History  Problem Relation Age of Onset   Prostate cancer Father    Stroke Father     Health Maintenance  Topic Date Due   COVID-19 Vaccine (3 - Moderna  risk series) 08/08/2023 (Originally 09/07/2020)   INFLUENZA VACCINE  07/08/2023   Medicare Annual Wellness (AWV)  01/09/2024   DTaP/Tdap/Td (2 - Td or Tdap) 01/27/2028   Pneumonia Vaccine 79+ Years old  Completed   Zoster Vaccines- Shingrix  Completed   HPV VACCINES  Aged Out     ----------------------------------------------------------------------------------------------------------------------------------------------------------------------------------------------------------------- Physical Exam BP 128/71 (BP Location: Left Arm, Patient Position: Sitting, Cuff Size: Normal)   Pulse 97   Ht 5\' 5"  (1.651 m)   Wt 141 lb (64 kg)   SpO2 96%   BMI 23.46 kg/m   Physical Exam Constitutional:      Appearance: Normal appearance.  Eyes:     General: No scleral icterus. Cardiovascular:     Rate and Rhythm: Normal rate and regular rhythm.  Pulmonary:     Comments: Diminished breath sounds on the right side.  Crackles heard in the left base. Musculoskeletal:     Cervical back: Neck supple.  Neurological:     Mental Status: He is alert.  Psychiatric:        Mood and Affect: Mood normal.        Behavior: Behavior normal.     ------------------------------------------------------------------------------------------------------------------------------------------------------------------------------------------------------------------- Assessment and Plan  Subacute cough He has has cough for a few months with worsening recently.  He is having some dyspnea with activity.  Diminished breath sounds on the R.  2 View CXR ordered.   Addendum:  CXR with R sided pneumothorax, moderate size.  Advised to proceed to ED.   Flank pain Sounds musculoskeletal in nature. Checking renal function and UA.     No orders of the defined types were placed in this encounter.   No follow-ups on file.    This visit occurred during the SARS-CoV-2 public health emergency.  Safety protocols were in  place, including screening questions prior to the visit, additional usage of staff PPE, and extensive cleaning of exam room while observing appropriate contact time as indicated for disinfecting solutions.

## 2023-06-23 NOTE — Assessment & Plan Note (Signed)
Sounds musculoskeletal in nature. Checking renal function and UA.

## 2023-06-23 NOTE — Assessment & Plan Note (Addendum)
He has has cough for a few months with worsening recently.  He is having some dyspnea with activity.  Diminished breath sounds on the R.  2 View CXR ordered.   Addendum:  CXR with R sided pneumothorax, moderate size.  Advised to proceed to ED.

## 2023-06-28 ENCOUNTER — Telehealth: Payer: Self-pay | Admitting: Family Medicine

## 2023-06-28 NOTE — Telephone Encounter (Signed)
Pt called. Hospital told him to call and let pcp know he had an operation on his lung and he is doing fine.

## 2023-06-29 ENCOUNTER — Encounter: Payer: Self-pay | Admitting: *Deleted

## 2023-06-29 ENCOUNTER — Telehealth: Payer: Self-pay | Admitting: *Deleted

## 2023-06-29 NOTE — Transitions of Care (Post Inpatient/ED Visit) (Signed)
   06/29/2023  Name: Benjamin Bryan MRN: [id number] DOB: 10/09/1928  Today's TOC FU Call Status: Today's TOC FU Call Status:: Unsuccessul Call (1st Attempt) Unsuccessful Call (1st Attempt) Date: 06/29/23 (attempted calls to both numbers on file for patient: neither would accept incoming messages)  Attempted to reach the patient regarding the most recent Inpatient visit 2 phone calls  Preferred number: Received automated outgoing message stating that patient's voice mail has not been set up and is not currently accepting messages   Alternate / home number: Received automated outgoing voice message stating that patient cannot accept call at this time; call spontaneously ended without option to leave voice message requesting call back  Follow Up Plan: Additional outreach attempts will be made to reach the patient to complete the Transitions of Care (Post Inpatient visit) call.   Caryl Pina, RN, BSN, CCRN Alumnus RN CM Care Coordination/ Transition of Care- [facility] Care Management [phone]: direct office

## 2023-06-30 ENCOUNTER — Telehealth: Payer: Self-pay | Admitting: *Deleted

## 2023-06-30 ENCOUNTER — Encounter: Payer: Self-pay | Admitting: *Deleted

## 2023-06-30 NOTE — Transitions of Care (Post Inpatient/ED Visit) (Signed)
   06/30/2023  Name: GRYFFIN ALTICE MRN: [id number] DOB: [birth year]  Today's TOC FU Call Status: Today's TOC FU Call Status:: Unsuccessful Call (2nd Attempt) Unsuccessful Call (2nd Attempt) Date: 06/30/23  Attempted to reach the patient regarding the most recent Inpatient visit; left HIPAA compliant voice message requesting call back  Follow Up Plan: Additional outreach attempts will be made to reach the patient to complete the Transitions of Care (Post Inpatient visit) call.   Caryl Pina, RN, BSN, CCRN Alumnus RN CM Care Coordination/ Transition of Care- [facility] Care Management [phone]: direct office

## 2023-07-01 ENCOUNTER — Ambulatory Visit: Payer: Medicare Other

## 2023-07-01 ENCOUNTER — Ambulatory Visit: Payer: Medicare Other | Admitting: Family Medicine

## 2023-07-01 ENCOUNTER — Encounter: Payer: Self-pay | Admitting: *Deleted

## 2023-07-01 ENCOUNTER — Telehealth: Payer: Self-pay | Admitting: *Deleted

## 2023-07-01 DIAGNOSIS — J439 Emphysema, unspecified: Secondary | ICD-10-CM | POA: Diagnosis not present

## 2023-07-01 DIAGNOSIS — J939 Pneumothorax, unspecified: Secondary | ICD-10-CM | POA: Diagnosis not present

## 2023-07-01 DIAGNOSIS — J9383 Other pneumothorax: Secondary | ICD-10-CM

## 2023-07-01 DIAGNOSIS — I10 Essential (primary) hypertension: Secondary | ICD-10-CM

## 2023-07-01 DIAGNOSIS — J90 Pleural effusion, not elsewhere classified: Secondary | ICD-10-CM | POA: Diagnosis not present

## 2023-07-01 DIAGNOSIS — E039 Hypothyroidism, unspecified: Secondary | ICD-10-CM | POA: Diagnosis not present

## 2023-07-01 MED ORDER — HYDROCODONE BIT-HOMATROP MBR 5-1.5 MG/5ML PO SOLN: 5.0000 mL | Freq: Four times a day (QID) | ORAL | 0 refills | Status: DC | PRN

## 2023-07-01 NOTE — Transitions of Care (Post Inpatient/ED Visit) (Signed)
07/01/2023  Name: Benjamin Bryan MRN: [id number] DOB: 20-May-1928  Today's TOC FU Call Status: Today's TOC FU Call Status:: Successful TOC FU Call Competed TOC FU Call Complete Date: 07/01/23  Transition Care Management Follow-up Telephone Call Date of Discharge: 06/27/23 Discharge Facility: Other ([facility]) Name of Other ([facility]) Discharge Facility: [facility] Type of Discharge: Inpatient Admission Primary Inpatient Discharge Diagnosis:: (R) pneumothorax with chest tube placement How have you been since you were released from the hospital?: Better (per spouse [name]: "He is doing okay, getting better everyday, but still has a lingering cough.  We are going to see the doctor this afternoon") Any questions or concerns?: No  Items Reviewed: Did you receive and understand the discharge instructions provided?: Yes (briefly reviewed with patient who verbalizes good understanding of same - outside hospital AVS) Medications obtained,verified, and reconciled?: Yes (Medications Reviewed) (Full medication reconciliation/ review completed; no concerns or discrepancies identified; confirmed patient obtained/ is taking all newly Rx'd medications as instructed; self-manages medications and denies questions/ concerns around medications today) Any new allergies since your discharge?: No Dietary orders reviewed?: Yes Type of Diet Ordered:: "Regular" Do you have support at home?: Yes People in Home: spouse Name of Support/Comfort Primary Source: Reports independent in self-care activities; supportive spouse assists as/ if needed/ indicated  Medications Reviewed Today: Medications Reviewed Today     Reviewed by Michaela Corner, RN (Registered Nurse) on 07/01/23 at 1043  Med List Status: <None>   Medication Order Taking? Sig Documenting Provider Last Dose Status Informant  AMBULATORY NON FORMULARY MEDICATION [id number] Yes Medication Name: Blood Pressure cuff with monitor. [name], NP Taking  Active   amLODipine (NORVASC) 5 MG tablet [id number] Yes Take 0.5 tablets (2.5 mg total) by mouth daily. Ronney Asters, NP Taking Active   carvedilol (COREG) 3.125 MG tablet [id number] Yes TAKE 1 TABLET BY MOUTH TWICE DAILY WITH A MEAL . Ronney Asters, NP Taking Active   famotidine (PEPCID) 20 MG tablet [id number] Yes Take 20 mg by mouth daily. [provider] Taking Active Self  ipratropium (ATROVENT) 0.06 % nasal spray [id number] Yes USE 2 SPRAY(S) IN EACH NOSTRIL EVERY 4 HOURS AS NEEDED Everrett Coombe, DO Taking Active   levothyroxine (SYNTHROID) 75 MCG tablet [id number] Yes Take 1 tablet by mouth once daily Everrett Coombe, DO Taking Active   tacrolimus (PROTOPIC) 0.1 % ointment [id number] No Apply topically 2 (two) times daily.  Patient not taking: Reported on 07/01/2023   [provider] Not Taking Active            Home Care and Equipment/Supplies: Were Home Health Services Ordered?: No Any new equipment or medical supplies ordered?: No  Functional Questionnaire: Do you need assistance with bathing/showering or dressing?: No Do you need assistance with meal preparation?: Yes (wife assists) Do you need assistance with eating?: No Do you have difficulty maintaining continence: No Do you need assistance with getting out of bed/getting out of a chair/moving?: No Do you have difficulty managing or taking your medications?: No  Follow up appointments reviewed: PCP Follow-up appointment confirmed?: Yes Date of PCP follow-up appointment?: 07/01/23 Follow-up Provider: PCP Specialist Hospital Follow-up appointment confirmed?: NA (verified not indicated per hospital discharging provider discharge notes) Do you need transportation to your follow-up appointment?: No Do you understand care options if your condition(s) worsen?: Yes-patient verbalized understanding  SDOH Interventions Today    Flowsheet Row Most Recent Value  SDOH Interventions   Food Insecurity  Interventions Intervention Not Indicated  Transportation  Interventions Intervention Not Indicated  [normally drives self,  spouse assisting post-hospital discharge on 06/27/23]      TOC Interventions Today    Flowsheet Row Most Recent Value  TOC Interventions   TOC Interventions Discussed/Reviewed TOC Interventions Discussed, S/S of infection, Post op wound/incision care  [Spouse declines need for ongoing/ further care coordination outreach,  no care coordination needs identified at time of TOC call today,  provided my direct contact information should questions/ concerns/ needs arise post-TOC call]      Interventions Today    Flowsheet Row Most Recent Value  Chronic Disease   Chronic disease during today's visit Other  [pneumothorax with chest tube placement]  General Interventions   General Interventions Discussed/Reviewed General Interventions Discussed, Doctor Visits, Durable Medical Equipment (DME)  Doctor Visits Discussed/Reviewed Doctor Visits Discussed, PCP, Specialist  Durable Medical Equipment (DME) Dalbert Mayotte uses walker as needed]  PCP/Specialist Visits Compliance with follow-up visit  Education Interventions   Education Provided Provided Education  Provided Verbal Education On Other  [need to monitor chest tube insertion site- advised to make sure PCP evaluates during HFU OV this afternoon]  Nutrition Interventions   Nutrition Discussed/Reviewed Nutrition Discussed  Pharmacy Interventions   Pharmacy Dicussed/Reviewed Pharmacy Topics Discussed  [Full medication review with updating medication list in EHR per patient report]  Safety Interventions   Safety Discussed/Reviewed Safety Discussed      Caryl Pina, RN, BSN, CCRN Alumnus RN CM Care Coordination/ Transition of Care- [facility] Care Management [phone]: direct office

## 2023-07-02 LAB — CBC WITH DIFFERENTIAL/PLATELET: Neutrophils: 64 %

## 2023-07-04 ENCOUNTER — Encounter: Payer: Self-pay | Admitting: Family Medicine

## 2023-07-04 DIAGNOSIS — J9383 Other pneumothorax: Secondary | ICD-10-CM | POA: Insufficient documentation

## 2023-07-04 NOTE — Assessment & Plan Note (Signed)
Lab Results  Component Value Date   TSH 3.26 11/19/2022  Continue levothyroxine at current strength.

## 2023-07-04 NOTE — Progress Notes (Signed)
Benjamin Bryan - 89 y.o. male MRN [id number]  Date of birth: December 29, 1927  Subjective No chief complaint on file.   HPI Benjamin Bryan is a 89 year old male here today for hospital follow-up.  I saw him approximately a week ago where he had increased cough as well as some dyspnea.  Noted to have decreased breath sounds on the right side.  Chest x-ray did show right-sided pneumothorax.  He was advised to go to the emergency room where he had chest tube placed.  He did have a CT scan of the chest which showed severe bilateral emphysematous changes which likely precipitated his pneumothorax.  Home health PT was arranged for him prior to discharge.  He reports he is feeling well today.  Still with cough and is requesting cough syrup to help with this.  He denies increased shortness of breath at this time.  ROS:  A comprehensive ROS was completed and negative except as noted per HPI   No Known Allergies  Past Medical History:  Diagnosis Date   Atherosclerosis of aortic bifurcation and common iliac arteries (HCC) 01/26/2018   Seen on xray 01/26/18. Normal ABI study 2018   Cancer [facility])    Basal cell carcinoma 11/18/2020   COPD (chronic obstructive pulmonary disease) (HCC) 10/03/2015   Frequent PVCs 09/11/2015   History of prostate cancer 10/03/2015   Radical prostectomy     History of pulmonary embolus (PE) 10/07/2018   Late October 2019 small clot burden   History of TIA (transient ischemic attack) 09/11/2015   Hyperlipidemia    Hypertension    Hypertensive kidney disease with stage 3 chronic kidney disease (HCC) 09/11/2015   Hypertensive urgency 08/30/2019   Hypothyroidism 09/11/2015   Iron deficiency anemia 12/09/2018   Neuralgia, neuritis, and radiculitis, unspecified 03/20/2014   Neurologic orthostatic hypotension (HCC) 09/12/2014   Shingles    Thyroid disease     Past Surgical History:  Procedure Laterality Date   radical prostectomy  1994   removed section of intestine  08/24/2005    polyp   SKIN CANCER EXCISION Left 11/18/2020   from the patient; done at Dr. Lonn [place] office; december 2021; removed from left arm and neck    Social History   Socioeconomic History   Marital status: Married    Spouse name: [name]   Number of children: 1   Years of education: 12   Highest education level: 12th grade  Occupational History   Occupation: retired    Comment: management  Tobacco Use   Smoking status: Former    Current packs/day: 0.00    Types: Cigarettes    Quit date: 12/11/1984    Years since quitting: 38.5   Smokeless tobacco: Never  Vaping Use   Vaping status: Never Used  Substance and Sexual Activity   Alcohol use: Not Currently   Drug use: No   Sexual activity: Not Currently    Partners: Female  Other Topics Concern   Not on file  Social History Narrative   Lives with his wife. He has one child, who lives at [facility]. He does have a niece who lives close by. He enjoys eating and sleeping.   Social Determinants of Health   Financial Resource Strain: Low Risk  (01/08/2023)   Overall Financial Resource Strain (CARDIA)    Difficulty of Paying Living Expenses: Not hard at all  Food Insecurity: No Food Insecurity (07/01/2023)   Hunger Vital Sign    Worried About Running Out of Food in the Last  Year: Never true    Ran Out of Food in the Last Year: Never true  Transportation Needs: No Transportation Needs (07/01/2023)   PRAPARE - Administrator, Civil Service (Medical): No    Lack of Transportation (Non-Medical): No  Physical Activity: Inactive (01/08/2023)   Exercise Vital Sign    Days of Exercise per Week: 0 days    Minutes of Exercise per Session: 0 min  Stress: No Stress Concern Present (06/23/2023)   Received from [facility] of Occupational Health - Occupational Stress Questionnaire    Feeling of Stress : Not at all  Social Connections: Unknown (06/23/2023)   Received from [facility]   Social Network    Social  Network: Not on file    Family History  Problem Relation Age of Onset   Prostate cancer Father    Stroke Father     Health Maintenance  Topic Date Due   COVID-19 Vaccine (3 - Moderna risk series) 08/08/2023 (Originally 09/07/2020)   INFLUENZA VACCINE  07/08/2023   Medicare Annual Wellness (AWV)  01/09/2024   DTaP/Tdap/Td (2 - Td or Tdap) 01/27/2028   Pneumonia Vaccine 38+ Years old  Completed   Zoster Vaccines- Shingrix  Completed   HPV VACCINES  Aged Out     ----------------------------------------------------------------------------------------------------------------------------------------------------------------------------------------------------------------- Physical Exam There were no vitals taken for this visit.  Physical Exam Constitutional:      Appearance: Normal appearance.  HENT:     Head: Normocephalic and atraumatic.  Cardiovascular:     Rate and Rhythm: Normal rate and regular rhythm.  Pulmonary:     Effort: Pulmonary effort is normal.     Breath sounds: Normal breath sounds.  Musculoskeletal:     Cervical back: Neck supple.  Neurological:     General: No focal deficit present.     Mental Status: He is alert.  Psychiatric:        Mood and Affect: Mood normal.        Behavior: Behavior normal.     ------------------------------------------------------------------------------------------------------------------------------------------------------------------------------------------------------------------- Assessment and Plan  Essential hypertension Blood pressures remain well-controlled current medications.  Will plan to continue these.  Hypothyroidism Lab Results  Component Value Date   TSH 3.26 11/19/2022  Continue levothyroxine at current strength.  Spontaneous pneumothorax Status post chest tube with good improvement.  Repeat chest x-ray today.  Rechecking CMP and CBC.  Chest tube site appears to be healing well.  Steri-Strip applied to  the small area of dehiscence.   Meds ordered this encounter  Medications   HYDROcodone bit-homatropine (HYDROMET) 5-1.5 MG/5ML syrup    Sig: Take 5 mLs by mouth every 6 (six) hours as needed for cough.    Dispense:  120 mL    Refill:  0    No follow-ups on file.    This visit occurred during the SARS-CoV-2 public health emergency.  Safety protocols were in place, including screening questions prior to the visit, additional usage of staff PPE, and extensive cleaning of exam room while observing appropriate contact time as indicated for disinfecting solutions.  Still bothersome to the other [facility]

## 2023-07-04 NOTE — Assessment & Plan Note (Signed)
Blood pressures remain well-controlled current medications.  Will plan to continue these.

## 2023-07-04 NOTE — Assessment & Plan Note (Signed)
Status post chest tube with good improvement.  Repeat chest x-ray today.  Rechecking CMP and CBC.  Chest tube site appears to be healing well.  Steri-Strip applied to the small area of dehiscence.

## 2023-07-05 DIAGNOSIS — C44319 Basal cell carcinoma of skin of other parts of face: Secondary | ICD-10-CM | POA: Diagnosis not present

## 2023-07-07 DIAGNOSIS — Z8709 Personal history of other diseases of the respiratory system: Secondary | ICD-10-CM | POA: Diagnosis not present

## 2023-07-07 DIAGNOSIS — E7800 Pure hypercholesterolemia, unspecified: Secondary | ICD-10-CM | POA: Diagnosis not present

## 2023-07-07 DIAGNOSIS — I129 Hypertensive chronic kidney disease with stage 1 through stage 4 chronic kidney disease, or unspecified chronic kidney disease: Secondary | ICD-10-CM | POA: Diagnosis not present

## 2023-07-07 DIAGNOSIS — Z9889 Other specified postprocedural states: Secondary | ICD-10-CM | POA: Diagnosis not present

## 2023-07-07 DIAGNOSIS — I517 Cardiomegaly: Secondary | ICD-10-CM | POA: Diagnosis not present

## 2023-07-07 DIAGNOSIS — N183 Chronic kidney disease, stage 3 unspecified: Secondary | ICD-10-CM | POA: Diagnosis not present

## 2023-07-07 DIAGNOSIS — R918 Other nonspecific abnormal finding of lung field: Secondary | ICD-10-CM | POA: Diagnosis not present

## 2023-07-07 DIAGNOSIS — J9383 Other pneumothorax: Secondary | ICD-10-CM | POA: Diagnosis not present

## 2023-07-07 DIAGNOSIS — E039 Hypothyroidism, unspecified: Secondary | ICD-10-CM | POA: Diagnosis not present

## 2023-07-07 DIAGNOSIS — J984 Other disorders of lung: Secondary | ICD-10-CM | POA: Diagnosis not present

## 2023-07-07 DIAGNOSIS — Z7409 Other reduced mobility: Secondary | ICD-10-CM | POA: Diagnosis not present

## 2023-07-07 DIAGNOSIS — J439 Emphysema, unspecified: Secondary | ICD-10-CM | POA: Diagnosis not present

## 2023-07-07 DIAGNOSIS — Z79899 Other long term (current) drug therapy: Secondary | ICD-10-CM | POA: Diagnosis not present

## 2023-07-07 DIAGNOSIS — Z86711 Personal history of pulmonary embolism: Secondary | ICD-10-CM | POA: Diagnosis not present

## 2023-07-07 DIAGNOSIS — Z87891 Personal history of nicotine dependence: Secondary | ICD-10-CM | POA: Diagnosis not present

## 2023-07-07 DIAGNOSIS — Z4682 Encounter for fitting and adjustment of non-vascular catheter: Secondary | ICD-10-CM | POA: Diagnosis not present

## 2023-07-07 DIAGNOSIS — J939 Pneumothorax, unspecified: Secondary | ICD-10-CM | POA: Diagnosis not present

## 2023-07-07 DIAGNOSIS — R0602 Shortness of breath: Secondary | ICD-10-CM | POA: Diagnosis not present

## 2023-07-12 DIAGNOSIS — L905 Scar conditions and fibrosis of skin: Secondary | ICD-10-CM | POA: Diagnosis not present

## 2023-07-13 ENCOUNTER — Telehealth: Payer: Self-pay | Admitting: *Deleted

## 2023-07-13 ENCOUNTER — Encounter: Payer: Self-pay | Admitting: *Deleted

## 2023-07-13 NOTE — Transitions of Care (Post Inpatient/ED Visit) (Signed)
   07/13/2023  Name: Benjamin Bryan MRN: [id number] DOB: 1928/07/28  Today's TOC FU Call Status: Today's TOC FU Call Status:: Unsuccessful Call (1st Attempt) Unsuccessful Call (1st Attempt) Date: 07/13/23  Attempted to reach the patient regarding the most recent Inpatient visit; left HIPAA compliant voice message requesting call back  Follow Up Plan: Additional outreach attempts will be made to reach the patient to complete the Transitions of Care (Post Inpatient visit) call.   Caryl Pina, RN, BSN, CCRN Alumnus RN CM Care Coordination/ Transition of Care- [facility] Care Management [phone]: direct office

## 2023-07-14 ENCOUNTER — Encounter: Payer: Self-pay | Admitting: *Deleted

## 2023-07-14 ENCOUNTER — Telehealth: Payer: Self-pay | Admitting: *Deleted

## 2023-07-14 NOTE — Transitions of Care (Post Inpatient/ED Visit) (Addendum)
07/14/2023  Name: Benjamin Bryan MRN: [id number] DOB: 07-18-1928  Today's TOC FU Call Status: Today's TOC FU Call Status:: Successful TOC FU Call Completed TOC FU Call Complete Date: 07/14/23  Transition Care Management Follow-up Telephone Call Date of Discharge: 07/10/23 Discharge Facility: Other ([facility]) Name of Other [facility] Discharge Facility: [facility] Type of Discharge: Inpatient Admission Primary Inpatient Discharge Diagnosis:: Recurrent spontaneous pneumothorax How have you been since you were released from the hospital?: Better (per spouse: "He is doing better and things are going okay.  Those doctors at [facility] told us to increase his amlodipine to 10 mg every day-- but when he was on 5 mg he was dizzy all the time.  I need to talk to Dr. Ashley Royalty about that") Any questions or concerns?: Yes Patient Questions/Concerns:: Patient was instructed to increase dose of amlodipine to 10 mg QD-- he is currently on 2.5 mg QD because the (prior) 5 mg dose made him "dizzy all the time" Patient Questions/Concerns Addressed: Notified Provider of Patient Questions/Concerns (instructed wife/ patient to continue monitoring blood pressures at home and any signs/ symptoms hypotension: made PCP aware of their concerns; reviewed recent blood pressures at home with spouse)  Items Reviewed: Did you receive and understand the discharge instructions provided?: Yes (briefly reviewed with patient who verbalizes good understanding of same - outside hospital AVS) Medications obtained,verified, and reconciled?: Yes (Medications Reviewed) (Full medication reconciliation/ review completed; no concerns or discrepancies identified; confirmed patient obtained/ is taking all newly Rx'd medications as instructed; spouse-manages medications and denies questions/ concerns around medications today) Any new allergies since your discharge?: No Dietary orders reviewed?: Yes Type of Diet Ordered:: "Regular; as  healthy as possible" Do you have support at home?: Yes People in Home: spouse Name of Support/Comfort Primary Source: Reports independent in self-care activities; supportive spouse assists as/ if needed/ indicated  Medications Reviewed Today: Medications Reviewed Today     Reviewed by Michaela Corner, RN (Registered Nurse) on 07/14/23 at 1004  Med List Status: <None>   Medication Order Taking? Sig Documenting Provider Last Dose Status Informant  AMBULATORY NON FORMULARY MEDICATION [id number] Yes Medication Name: Blood Pressure cuff with monitor. [name], NP Taking Active   amLODipine (NORVASC) 5 MG tablet [id number] Yes Take 0.5 tablets (2.5 mg total) by mouth daily. Ronney Asters, NP Taking Active            Med Note Otelia Limes Jul 14, 2023  9:49 AM) 07/14/23: Reports during [facility] call that this medication dose was increased at time of hospital discharge from [facility] on 07/10/23-- wife has concerns about increased dose at time of hospital discharge-- states when it was increased last time-- patient was dizzy "all the time"  carvedilol (COREG) 3.125 MG tablet [id number] Yes TAKE 1 TABLET BY MOUTH TWICE DAILY WITH A MEAL . Ronney Asters, NP Taking Active   famotidine (PEPCID) 20 MG tablet [id number] Yes Take 20 mg by mouth daily. [provider] Taking Active Self  HYDROcodone bit-homatropine (HYDROMET) 5-1.5 MG/5ML syrup [id number] Yes Take 5 mLs by mouth every 6 (six) hours as needed for cough. Everrett Coombe, DO Taking Active            Med Note Michaela Corner   Wed Jul 14, 2023  9:51 AM) 07/14/23: Reports during TOC call has not needed recently  ipratropium (ATROVENT) 0.06 % nasal spray [id number] Yes USE 2 SPRAY(S) IN EACH NOSTRIL EVERY 4 HOURS AS NEEDED Everrett Coombe,  DO Taking Active   levothyroxine (SYNTHROID) 75 MCG tablet [id number] Yes Take 1 tablet by mouth once daily Everrett Coombe, DO Taking Active   tacrolimus (PROTOPIC) 0.1 % ointment [id number] No Apply  topically 2 (two) times daily.  Patient not taking: Reported on 07/01/2023   [provider] Not Taking Active             Home Care and Equipment/Supplies: Were Home Health Services Ordered?: No Any new equipment or medical supplies ordered?: No  Functional Questionnaire: Do you need assistance with bathing/showering or dressing?: No Do you need assistance with meal preparation?: Yes (spouse primarily manages meals/ meal preparation) Do you need assistance with eating?: No Do you have difficulty maintaining continence: No Do you need assistance with getting out of bed/getting out of a chair/moving?: No Do you have difficulty managing or taking your medications?: Yes (spouse manages most aspects of medication administration)  Follow up appointments reviewed: PCP Follow-up appointment confirmed?: Yes Date of PCP follow-up appointment?: 07/19/23 Follow-up Provider: PCP Specialist Hospital Follow-up appointment confirmed?: NA (verified not indicated per hospital discharging provider discharge notes) Do you need transportation to your follow-up appointment?: No Do you understand care options if your condition(s) worsen?: Yes-patient verbalized understanding  SDOH Interventions Today    Flowsheet Row Most Recent Value  SDOH Interventions   Food Insecurity Interventions Intervention Not Indicated  Transportation Interventions Intervention Not Indicated  [patient normally drives self,  wife assisting as indicated after recent hospitalizations]      TOC Interventions Today    Flowsheet Row Most Recent Value  TOC Interventions   TOC Interventions Discussed/Reviewed TOC Interventions Discussed, Contacted provider for patient needs, Post op wound/incision care      Interventions Today    Flowsheet Row Most Recent Value  Chronic Disease   Chronic disease during today's visit Other  [spontaneous pneumothorax]  General Interventions   General Interventions  Discussed/Reviewed General Interventions Discussed, Durable Medical Equipment (DME), Doctor Visits, Referral to Nurse, Communication with  [scheduled with RN CM Care Coordinator for follow up telephone visit on 07/29/23]  Doctor Visits Discussed/Reviewed PCP, Doctor Visits Discussed  Durable Medical Equipment (DME) BP Cuff, Walker  [confirmed currently requiring/ using assistive devices - uses walker prn]  PCP/Specialist Visits Compliance with follow-up visit  Communication with PCP/Specialists, RN  Education Interventions   Education Provided Provided Education  Provided Engineer, petroleum On Other, Medication  [purpose of BP medication, need to continue home BP monitoring/ recording and take to provider office visits,  signs/ symptoms hypotension with corresponding action plan]  Nutrition Interventions   Nutrition Discussed/Reviewed Nutrition Discussed  Pharmacy Interventions   Pharmacy Dicussed/Reviewed Pharmacy Topics Discussed  [Full medication review with updating medication list in EHR per patient report]  Safety Interventions   Safety Discussed/Reviewed Safety Discussed, Fall Risk      Caryl Pina, RN, BSN, CCRN Alumnus RN CM Care Coordination/ Transition of Care- [facility] Care Management [phone]: direct office

## 2023-07-19 ENCOUNTER — Encounter: Payer: Self-pay | Admitting: Family Medicine

## 2023-07-19 ENCOUNTER — Ambulatory Visit (INDEPENDENT_AMBULATORY_CARE_PROVIDER_SITE_OTHER): Payer: Medicare Other

## 2023-07-19 ENCOUNTER — Ambulatory Visit: Payer: Medicare Other | Admitting: Family Medicine

## 2023-07-19 VITALS — BP 177/73 | HR 70 | Ht 65.0 in | Wt 139.0 lb

## 2023-07-19 DIAGNOSIS — J40 Bronchitis, not specified as acute or chronic: Secondary | ICD-10-CM | POA: Diagnosis not present

## 2023-07-19 DIAGNOSIS — R918 Other nonspecific abnormal finding of lung field: Secondary | ICD-10-CM | POA: Diagnosis not present

## 2023-07-19 DIAGNOSIS — J939 Pneumothorax, unspecified: Secondary | ICD-10-CM | POA: Diagnosis not present

## 2023-07-19 DIAGNOSIS — J9383 Other pneumothorax: Secondary | ICD-10-CM

## 2023-07-19 DIAGNOSIS — J8410 Pulmonary fibrosis, unspecified: Secondary | ICD-10-CM | POA: Diagnosis not present

## 2023-07-19 DIAGNOSIS — I10 Essential (primary) hypertension: Secondary | ICD-10-CM | POA: Diagnosis not present

## 2023-07-19 NOTE — Progress Notes (Signed)
Benjamin Bryan - 89 y.o. male MRN [id number]  Date of birth: [birth year]  Subjective Chief Complaint  Patient presents with   Hospitalization Follow-up    HPI Benjamin Bryan is a 89 y.o. male here today for hospital follow up.  He was recently admitted again for recurrent pneumothorax.  Iniitial visit was on 7/17 when CXR showed moderate R sided pneumothorax.  He was admitted to [facility] and had chest tube placed.  F/u with me on 7/25 and still had some pain and cough.  Repeat CXR with large R sided PTX.  Readmitted and repeat chest tube placed with suction.  Repeat imaging after chest tube removal showed continued resolution of pneumothorax.  BP was noted to be elevated during his admission and amlodipine was adjust to 10mg  daily.  His wife reports that she did not increase his amlodipine as he felt really lightheaded when this was increased in the past.  Today he reports that he is feeling pretty well.  He has not had any recurrence of chest pain, shortness of breath and his cough has improved.  ROS:  A comprehensive ROS was completed and negative except as noted per HPI  No Known Allergies  Past Medical History:  Diagnosis Date   Atherosclerosis of aortic bifurcation and common iliac arteries (HCC) 01/26/2018   Seen on xray 01/26/18. Normal ABI study 2018   Cancer [facility])    Basal cell carcinoma 11/18/2020   COPD (chronic obstructive pulmonary disease) (HCC) 10/03/2015   Frequent PVCs 09/11/2015   History of prostate cancer 10/03/2015   Radical prostectomy     History of pulmonary embolus (PE) 10/07/2018   Late October 2019 small clot burden   History of TIA (transient ischemic attack) 09/11/2015   Hyperlipidemia    Hypertension    Hypertensive kidney disease with stage 3 chronic kidney disease (HCC) 09/11/2015   Hypertensive urgency 08/30/2019   Hypothyroidism 09/11/2015   Iron deficiency anemia 12/09/2018   Neuralgia, neuritis, and radiculitis, unspecified 03/20/2014   Neurologic orthostatic  hypotension (HCC) 09/12/2014   Shingles    Thyroid disease     Past Surgical History:  Procedure Laterality Date   radical prostectomy  1994   removed section of intestine  08/24/2005   polyp   SKIN CANCER EXCISION Left 11/18/2020   from the patient; done at Dr. Lonn Georgia office; december 2021; removed from left arm and neck    Social History   Socioeconomic History   Marital status: Married    Spouse name: [name]   Number of children: 1   Years of education: 12   Highest education level: 12th grade  Occupational History   Occupation: retired    Comment: management  Tobacco Use   Smoking status: Former    Current packs/day: 0.00    Types: Cigarettes    Quit date: 12/11/1984    Years since quitting: 38.6   Smokeless tobacco: Never  Vaping Use   Vaping status: Never Used  Substance and Sexual Activity   Alcohol use: Not Currently   Drug use: No   Sexual activity: Not Currently    Partners: Female  Other Topics Concern   Not on file  Social History Narrative   Lives with his wife. He has one child, who lives at [facility]. He does have a niece who lives close by. He enjoys eating and sleeping.   Social Determinants of Health   Financial Resource Strain: Low Risk  (01/08/2023)   Overall Financial Resource Strain (CARDIA)  Difficulty of Paying Living Expenses: Not hard at all  Food Insecurity: No Food Insecurity (07/14/2023)   Hunger Vital Sign    Worried About Running Out of Food in the Last Year: Never true    Ran Out of Food in the Last Year: Never true  Transportation Needs: No Transportation Needs (07/14/2023)   PRAPARE - Administrator, Civil Service (Medical): No    Lack of Transportation (Non-Medical): No  Physical Activity: Inactive (01/08/2023)   Exercise Vital Sign    Days of Exercise per Week: 0 days    Minutes of Exercise per Session: 0 min  Stress: No Stress Concern Present (06/23/2023)   Received from [facility] of  Occupational Health - Occupational Stress Questionnaire    Feeling of Stress : Not at all  Social Connections: Unknown (06/23/2023)   Received from [facility]   Social Network    Social Network: Not on file    Family History  Problem Relation Age of Onset   Prostate cancer Father    Stroke Father     Health Maintenance  Topic Date Due   INFLUENZA VACCINE  07/08/2023   COVID-19 Vaccine (3 - Moderna risk series) 08/08/2023 (Originally 09/07/2020)   Medicare Annual Wellness (AWV)  01/09/2024   DTaP/Tdap/Td (2 - Td or Tdap) 01/27/2028   Pneumonia Vaccine 48+ Years old  Completed   Zoster Vaccines- Shingrix  Completed   HPV VACCINES  Aged Out     ----------------------------------------------------------------------------------------------------------------------------------------------------------------------------------------------------------------- Physical Exam BP (!) 177/73 (BP Location: Left Arm, Patient Position: Sitting, Cuff Size: Normal)   Pulse 70   Ht 5\' 5"  (1.651 m)   Wt 139 lb (63 kg)   SpO2 99%   BMI 23.13 kg/m   Physical Exam Constitutional:      Appearance: Normal appearance.  Eyes:     General: No scleral icterus. Cardiovascular:     Rate and Rhythm: Normal rate and regular rhythm.  Pulmonary:     Effort: Pulmonary effort is normal.     Breath sounds: Normal breath sounds.     Comments: Sutures present at site of previous chest tube.  Removed easily today.  Steri-Strips applied. Neurological:     Mental Status: He is alert.  Psychiatric:        Mood and Affect: Mood normal.        Behavior: Behavior normal.     ------------------------------------------------------------------------------------------------------------------------------------------------------------------------------------------------------------------- Assessment and Plan  Essential hypertension Blood pressure is elevated today.  Readings at home have been better.  Denies  symptoms related to hypertension.  Did not tolerate increasing amlodipine previously due to episodes of hypotension.  Spontaneous pneumothorax Symptoms have improved.  Repeat chest x-ray ordered today.   No orders of the defined types were placed in this encounter.   No follow-ups on file.    This visit occurred during the SARS-CoV-2 public health emergency.  Safety protocols were in place, including screening questions prior to the visit, additional usage of staff PPE, and extensive cleaning of exam room while observing appropriate contact time as indicated for disinfecting solutions.

## 2023-07-19 NOTE — Assessment & Plan Note (Signed)
Blood pressure is elevated today.  Readings at home have been better.  Denies symptoms related to hypertension.  Did not tolerate increasing amlodipine previously due to episodes of hypotension.

## 2023-07-19 NOTE — Assessment & Plan Note (Signed)
Symptoms have improved.  Repeat chest x-ray ordered today.

## 2023-07-27 ENCOUNTER — Other Ambulatory Visit: Payer: Self-pay | Admitting: Family Medicine

## 2023-07-29 ENCOUNTER — Ambulatory Visit: Payer: Self-pay

## 2023-07-29 NOTE — Patient Outreach (Signed)
  Care Coordination   Initial Visit Note   07/29/2023 Name: Benjamin Bryan MRN: [id number] DOB: 1928-08-26  Benjamin Bryan is a 89 y.o. year old male who sees Benjamin Coombe, DO for primary care. I spoke with  Benjamin Bryan by phone today.  What matters to the patients health and wellness today?  Benjamin Bryan reports "everything is going good". He reports he is "breathing fine". Office visit with Primary provider completed on  07/19/23. He is without concerns or questions at this time. He is receptive to [facility] following up after his next appointment with primary provider on 08/12/23.   Goals Addressed             This Visit's Progress    assist with health management post hospitalization       Interventions Today    Flowsheet Row Most Recent Value  Chronic Disease   Chronic disease during today's visit Other  [spontaneous pneumothorax post hospitalization]  General Interventions   General Interventions Discussed/Reviewed General Interventions Discussed, Doctor Visits  Doctor Visits Discussed/Reviewed Doctor Visits Discussed, PCP  PCP/Specialist Visits Compliance with follow-up visit  [reviewed upcoming/scheduled appointments]  Education Interventions   Education Provided Provided Education  [reviewed patient instructions from primary provider office visit 07/19/23.]  Provided Verbal Education On Other, Medication  [advised to continue to take medications as prescribed, attend provider visits as scheduled,  contact provider with any health questions or concerns.]  Pharmacy Interventions   Pharmacy Dicussed/Reviewed Pharmacy Topics Discussed  Benjamin Bryan medication needs: patient reports he has all medications prescribed. reports does not have his list in front of him therefore unable to fully discuss dosage during time of call(recently completed on 07/19/23)]  Safety Interventions   Safety Discussed/Reviewed Safety Discussed  [assessed dme: patient reports has walker, but does not need to use  routinely]            SDOH assessments and interventions completed:  Yes.recently completed. No changes.   Care Coordination Interventions:  Yes, provided   Follow up plan: Follow up call scheduled for 9/10:24    Encounter Outcome:  Pt. Visit Completed   Benjamin Sheriff, RN, MSN, BSN, CCM Care Coordinator [phone]

## 2023-07-29 NOTE — Patient Instructions (Signed)
Visit Information  Thank you for taking time to visit with me today. Please don't hesitate to contact me if I can be of assistance to you.   Following are the goals we discussed today:  Continue to take medications as prescribed. Continue to attend provider visits as scheduled Contact provider with any health questions or concerns   Our next appointment is by telephone on 08/17/23 at 1:30 pm  Please call the care guide team at [phone] if you need to cancel or reschedule your appointment.   If you are experiencing a Mental Health or Behavioral Health Crisis or need someone to talk to, please call the Suicide and Crisis Lifeline: 988 call the [place] National Suicide Prevention Lifeline: 4051254777 or TTY: 680-283-2307 TTY 618-491-5685) to talk to a trained counselor call 1-800-273-TALK (toll free, 24 hour hotline)  Kathyrn Sheriff, RN, MSN, BSN, CCM Care Management Coordinator [phone]

## 2023-08-12 ENCOUNTER — Encounter: Payer: Self-pay | Admitting: Family Medicine

## 2023-08-12 ENCOUNTER — Ambulatory Visit (INDEPENDENT_AMBULATORY_CARE_PROVIDER_SITE_OTHER): Payer: Medicare Other | Admitting: Family Medicine

## 2023-08-12 VITALS — BP 161/65 | HR 65 | Ht 65.0 in | Wt 138.0 lb

## 2023-08-12 DIAGNOSIS — J9383 Other pneumothorax: Secondary | ICD-10-CM

## 2023-08-12 DIAGNOSIS — E039 Hypothyroidism, unspecified: Secondary | ICD-10-CM | POA: Diagnosis not present

## 2023-08-12 DIAGNOSIS — I10 Essential (primary) hypertension: Secondary | ICD-10-CM

## 2023-08-12 NOTE — Assessment & Plan Note (Signed)
 Lab Results  Component Value Date   TSH 3.26 11/19/2022  Continue levothyroxine at current strength.

## 2023-08-12 NOTE — Assessment & Plan Note (Signed)
Resolved, denies new symptoms.

## 2023-08-12 NOTE — Assessment & Plan Note (Signed)
 Blood pressure is elevated today.  Readings at home have been better.  Denies symptoms related to hypertension.  Did not tolerate increasing amlodipine previously due to episodes of hypotension.

## 2023-08-12 NOTE — Progress Notes (Signed)
Benjamin Bryan - 89 y.o. male MRN [id number]  Date of birth: May 16, 1928  Subjective Chief Complaint  Patient presents with   Hypertension    HPI Benjamin Bryan is a 89 y.o. male here today for follow up.   He reports that he is feeling well.  Recently hospitalized x2 for spontaneous PTX.  He is doing well from this.   Continues on amlodipine and carvedilol for management of HTN.  He is doing well with current medications.  BP at home has been pretty well controlled.  Denies chest pain, shortness of breath, palpitations, headache or vision changes.   ROS:  A comprehensive ROS was completed and negative except as noted per HPI  No Known Allergies  Past Medical History:  Diagnosis Date   Atherosclerosis of aortic bifurcation and common iliac arteries (HCC) 01/26/2018   Seen on xray 01/26/18. Normal ABI study 2018   Cancer [facility])    Basal cell carcinoma 11/18/2020   COPD (chronic obstructive pulmonary disease) (HCC) 10/03/2015   Frequent PVCs 09/11/2015   History of prostate cancer 10/03/2015   Radical prostectomy     History of pulmonary embolus (PE) 10/07/2018   Late October 2019 small clot burden   History of TIA (transient ischemic attack) 09/11/2015   Hyperlipidemia    Hypertension    Hypertensive kidney disease with stage 3 chronic kidney disease (HCC) 09/11/2015   Hypertensive urgency 08/30/2019   Hypothyroidism 09/11/2015   Iron deficiency anemia 12/09/2018   Neuralgia, neuritis, and radiculitis, unspecified 03/20/2014   Neurologic orthostatic hypotension (HCC) 09/12/2014   Shingles    Thyroid disease     Past Surgical History:  Procedure Laterality Date   radical prostectomy  1994   removed section of intestine  08/24/2005   polyp   SKIN CANCER EXCISION Left 11/18/2020   from the patient; done at Dr. Lonn Georgia office; december 2021; removed from left arm and neck    Social History   Socioeconomic History   Marital status: Married    Spouse name: [name]   Number of  children: 1   Years of education: 12   Highest education level: 12th grade  Occupational History   Occupation: retired    Comment: management  Tobacco Use   Smoking status: Former    Current packs/day: 0.00    Types: Cigarettes    Quit date: 12/11/1984    Years since quitting: 38.6   Smokeless tobacco: Never  Vaping Use   Vaping status: Never Used  Substance and Sexual Activity   Alcohol use: Not Currently   Drug use: No   Sexual activity: Not Currently    Partners: Female  Other Topics Concern   Not on file  Social History Narrative   Lives with his wife. He has one child, who lives at [facility]. He does have a niece who lives close by. He enjoys eating and sleeping.   Social Determinants of Health   Financial Resource Strain: Low Risk  (01/08/2023)   Overall Financial Resource Strain (CARDIA)    Difficulty of Paying Living Expenses: Not hard at all  Food Insecurity: No Food Insecurity (07/14/2023)   Hunger Vital Sign    Worried About Running Out of Food in the Last Year: Never true    Ran Out of Food in the Last Year: Never true  Transportation Needs: No Transportation Needs (07/14/2023)   PRAPARE - Administrator, Civil Service (Medical): No    Lack of Transportation (Non-Medical): No  Physical  Activity: Inactive (01/08/2023)   Exercise Vital Sign    Days of Exercise per Week: 0 days    Minutes of Exercise per Session: 0 min  Stress: No Stress Concern Present (06/23/2023)   Received from [facility] of Occupational Health - Occupational Stress Questionnaire    Feeling of Stress : Not at all  Social Connections: Unknown (06/23/2023)   Received from [facility]   Social Network    Social Network: Not on file    Family History  Problem Relation Age of Onset   Prostate cancer Father    Stroke Father     Health Maintenance  Topic Date Due   COVID-19 Vaccine (3 - Moderna risk series) 10/28/2023 (Originally 09/07/2020)   INFLUENZA  VACCINE  03/06/2024 (Originally 07/08/2023)   Medicare Annual Wellness (AWV)  01/09/2024   DTaP/Tdap/Td (2 - Td or Tdap) 01/27/2028   Pneumonia Vaccine 67+ Years old  Completed   Zoster Vaccines- Shingrix  Completed   HPV VACCINES  Aged Out     ----------------------------------------------------------------------------------------------------------------------------------------------------------------------------------------------------------------- Physical Exam BP (!) 161/65 (BP Location: Left Arm, Patient Position: Sitting, Cuff Size: Normal)   Pulse 65   Ht 5\' 5"  (1.651 m)   Wt 138 lb (62.6 kg)   SpO2 100%   BMI 22.96 kg/m   Physical Exam Constitutional:      Appearance: Normal appearance.  HENT:     Head: Normocephalic and atraumatic.  Cardiovascular:     Rate and Rhythm: Normal rate and regular rhythm.  Pulmonary:     Effort: Pulmonary effort is normal.     Breath sounds: Normal breath sounds.  Neurological:     General: No focal deficit present.     Mental Status: He is alert.  Psychiatric:        Mood and Affect: Mood normal.        Behavior: Behavior normal.     ------------------------------------------------------------------------------------------------------------------------------------------------------------------------------------------------------------------- Assessment and Plan  Essential hypertension Blood pressure is elevated today.  Readings at home have been better.  Denies symptoms related to hypertension.  Did not tolerate increasing amlodipine previously due to episodes of hypotension.  Hypothyroidism Lab Results  Component Value Date   TSH 3.26 11/19/2022  Continue levothyroxine at current strength.  Spontaneous pneumothorax Resolved, denies new symptoms.    No orders of the defined types were placed in this encounter.   No follow-ups on file.    This visit occurred during the SARS-CoV-2 public health emergency.  Safety  protocols were in place, including screening questions prior to the visit, additional usage of staff PPE, and extensive cleaning of exam room while observing appropriate contact time as indicated for disinfecting solutions.

## 2023-08-17 ENCOUNTER — Telehealth: Payer: Self-pay

## 2023-08-17 ENCOUNTER — Ambulatory Visit: Payer: Self-pay

## 2023-08-17 NOTE — Patient Outreach (Signed)
  Care Coordination   Follow Up Visit Note   08/17/2023 Name: Benjamin Bryan MRN: [id number] DOB: 12-30-1927  Benjamin Bryan is a 89 y.o. year old male who sees Benjamin Coombe, DO for primary care. I spoke with spuse Fayrene Fearing Kanaan(dpr) by phone today.  What matters to the patients health and wellness today?  Per Mrs. Cothran, patient HOH and request she speak with RNCM. Mrs. Renda reports patient is doing well. Office visit with PCP on ___ increased BP during that visit. She reports patient is checking and recording BP daily and will follow up with PCP in two weeks for BP check and to check accuracy of his BP monitor. No questions or concerns at this time.  Goals Addressed             This Visit's Progress    assist with health management post hospitalization       Interventions Today    Flowsheet Row Most Recent Value  Chronic Disease   Chronic disease during today's visit Hypertension (HTN)  [post hospitalization for spontaneous pneumothorax]  General Interventions   General Interventions Discussed/Reviewed General Interventions Reviewed, Doctor Visits  Doctor Visits Discussed/Reviewed Doctor Visits Reviewed  PCP/Specialist Visits Compliance with follow-up visit  [discussed upcoming follow ups. per wife Mr. Manansala to go to PCP office in 2 weeks for BP check and check accuracy of his monitor]  Education Interventions   Education Provided Provided Education  Provided Verbal Education On --  [advised to attend provider visits as scheduled, check and record BP. advised BP </= 140/ 90 or per Provider recommendation. Encouraged to discuss Target blood pressure range with provider. advised contact provider with heatlh questions or concerns]  Nutrition Interventions   Nutrition Discussed/Reviewed Nutrition Reviewed  [salt intake]  Pharmacy Interventions   Pharmacy Dicussed/Reviewed Pharmacy Topics Reviewed            SDOH assessments and interventions completed:  No  Care  Coordination Interventions:  Yes, provided   Follow up plan: Follow up call scheduled for 09/08/23    Encounter Outcome:  Patient Visit Completed   Kathyrn Sheriff, RN, MSN, BSN, CCM Care Management Coordinator [phone]

## 2023-08-17 NOTE — Patient Outreach (Signed)
  Care Coordination   08/17/2023 Name: Benjamin Bryan MRN: [id number] DOB: 06-Nov-1928   Care Coordination Outreach Attempts:  An unsuccessful telephone outreach was attempted for a scheduled appointment today.  Follow Up Plan:  Additional outreach attempts will be made to offer the patient care coordination information and services.   Encounter Outcome:  No Answer   Care Coordination Interventions:  No, not indicated    Kathyrn Sheriff, RN, MSN, BSN, CCM Care Management Coordinator [phone]

## 2023-08-17 NOTE — Patient Instructions (Signed)
Visit Information  Thank you for taking time to visit with me today. Please don't hesitate to contact me if I can be of assistance to you.   Following are the goals we discussed today:  Continue to take medications as prescribed. Continue to attend provider visits as scheduled Continue to eat healthy, lean meats, vegetables, fruits, avoid saturated and transfats. Monitor salt intake Continue to check blood pressure routinely and contact provider if questions or concerns. Target goal <140/90 or per provider recommendation. Discuss with your provider at next visit.   Our next appointment is by telephone on 09/08/23 at 1:45 pm  Please call the care guide team at [phone] if you need to cancel or reschedule your appointment.   If you are experiencing a Mental Health or Behavioral Health Crisis or need someone to talk to, please call the Suicide and Crisis Lifeline: 988 call the [place] National Suicide Prevention Lifeline: (925)143-5905 or TTY: (618)544-5166 TTY 514-705-5571) to talk to a trained counselor call 1-800-273-TALK (toll free, 24 hour hotline)  Kathyrn Sheriff, RN, MSN, BSN, CCM Care Management Coordinator [phone]

## 2023-08-20 ENCOUNTER — Other Ambulatory Visit: Payer: Self-pay | Admitting: Family Medicine

## 2023-08-25 ENCOUNTER — Ambulatory Visit (INDEPENDENT_AMBULATORY_CARE_PROVIDER_SITE_OTHER): Payer: Medicare Other | Admitting: Family Medicine

## 2023-08-25 VITALS — BP 118/70 | HR 80 | Ht 65.0 in | Wt 139.0 lb

## 2023-08-25 DIAGNOSIS — I10 Essential (primary) hypertension: Secondary | ICD-10-CM | POA: Diagnosis not present

## 2023-08-25 NOTE — Progress Notes (Signed)
Established Patient Office Visit  Subjective   Patient ID: Benjamin Bryan, male    DOB: 04-13-1928  Age: 89 y.o. MRN: [id number]  Chief Complaint  Patient presents with   Hypertension    B/P check nurse visit. Brought own machine as well.    HPI  Patient is here for b/p check.Brought in his own b/p monitor. Denies palpitations, shortness of breath or complications with medication.  ROS    Objective:     BP 118/70   Pulse 80   Ht 5\' 5"  (1.651 m)   Wt 139 lb (63 kg)   SpO2 100%   BMI 23.13 kg/m    Physical Exam   No results found for any visits on 08/25/23.    The ASCVD Risk score (Arnett DK, et al., 2019) failed to calculate for the following reasons:   The 2019 ASCVD risk score is only valid for ages 19 to 63    Assessment & Plan:  Patient was advised to continue with b/p monitoring and medications prescribed. Problem List Items Addressed This Visit   None   No follow-ups on file.    Jaziya Obarr  [place], CMA

## 2023-09-02 ENCOUNTER — Other Ambulatory Visit: Payer: Self-pay | Admitting: Family Medicine

## 2023-09-02 DIAGNOSIS — E079 Disorder of thyroid, unspecified: Secondary | ICD-10-CM

## 2023-09-08 ENCOUNTER — Ambulatory Visit: Payer: Self-pay

## 2023-09-08 NOTE — Patient Outreach (Signed)
Care Coordination   Follow Up Visit Note   09/08/2023 Name: TALMADGE LOUQUE MRN: [id number] DOB: 1927-12-28  Marta Lamas Brow is a 89 y.o. year old male who sees Everrett Coombe, DO for primary care. I spoke with  Marta Lamas Wolk by phone today.  What matters to the patients health and wellness today?  Mr. Stilts reports he is doing well. He reports he keeps track of blood pressure routinely. He reports he took BP monitor to last office visit to have it checked for accuracy. Last office visit with Primary care 08/12/23. Patient reports he has all his medications and just got a refill on yesterday. He denies any questions or concerns.  Goals Addressed             This Visit's Progress    COMPLETED: assist with health management post hospitalization       Interventions Today    Flowsheet Row Most Recent Value  Chronic Disease   Chronic disease during today's visit Other, Hypertension (HTN)  [post hospitalization for spontaneous pneumothorax)]  General Interventions   General Interventions Discussed/Reviewed General Interventions Reviewed, Doctor Visits  Doctor Visits Discussed/Reviewed Doctor Visits Reviewed  Education Interventions   Education Provided Provided Education  Provided Verbal Education On Other  [advised to take medications as prescribed, attend appointments as scheduled, advised to keep track of BP as recommended and contact provider with any questions/concerns]  Pharmacy Interventions   Pharmacy Dicussed/Reviewed Pharmacy Topics Reviewed           Health Management       Interventions Today    Flowsheet Row Most Recent Value  Chronic Disease   Chronic disease during today's visit Other, Hypertension (HTN)  [hospitalized for spontaneous pneumothorax July 2024)]  General Interventions   General Interventions Discussed/Reviewed General Interventions Reviewed, Doctor Visits  Doctor Visits Discussed/Reviewed Doctor Visits Reviewed  Education Interventions   Education  Provided Provided Education  Provided Verbal Education On Other  [advised to take medications as prescribed, attend appointments as scheduled, advised to keep track of BP as recommended and contact provider with any questions/concerns]  Pharmacy Interventions   Pharmacy Dicussed/Reviewed Pharmacy Topics Reviewed            SDOH assessments and interventions completed:  No  Care Coordination Interventions:  Yes, provided   Follow up plan: Follow up call scheduled for 10/08/23    Encounter Outcome:  Patient Visit Completed   Kathyrn Sheriff, RN, MSN, BSN, CCM Care Management Coordinator [phone]

## 2023-09-08 NOTE — Patient Instructions (Signed)
Visit Information  Thank you for taking time to visit with me today. Please don't hesitate to contact me if I can be of assistance to you.   Following are the goals we discussed today:  Continue to take medications as prescribed. Continue to attend provider visits as scheduled Continue to eat healthy, lean meats, vegetables, fruits, avoid saturated and transfats Continue to check blood pressure routinely and contact provider if questions or concerns   Our next appointment is by telephone on 10/08/23 at 1:15 pm  Please call the care guide team at [phone] if you need to cancel or reschedule your appointment.   If you are experiencing a Mental Health or Behavioral Health Crisis or need someone to talk to, please call the Suicide and Crisis Lifeline: 988 call the [place] National Suicide Prevention Lifeline: (614)817-7009 or TTY: (612)327-1839 TTY 4454277742) to talk to a trained counselor call 1-800-273-TALK (toll free, 24 hour hotline)  Kathyrn Sheriff, RN, MSN, BSN, CCM Care Management Coordinator [phone]

## 2023-09-09 ENCOUNTER — Other Ambulatory Visit: Payer: Self-pay

## 2023-10-08 ENCOUNTER — Ambulatory Visit: Payer: Self-pay

## 2023-10-08 NOTE — Patient Instructions (Signed)
Visit Information  Thank you for taking time to visit with me today. Please don't hesitate to contact me if I can be of assistance to you.   Following are the goals we discussed today:  Continue to take medications as prescribed. Continue to attend provider visits as scheduled Continue to eat healthy, lean meats, vegetables, fruits, avoid saturated and transfats Contact provider with health questions or concerns as needed  Our next appointment is by telephone on 12/09/23 at 11:00 am  Please call the care guide team at [phone] if you need to cancel or reschedule your appointment.   If you are experiencing a Mental Health or Behavioral Health Crisis or need someone to talk to, please call the Suicide and Crisis Lifeline: 988 call the [place] National Suicide Prevention Lifeline: 918 867 6449 or TTY: 805-830-8637 TTY 986 264 3613) to talk to a trained counselor call 1-800-273-TALK (toll free, 24 hour hotline)   Kathyrn Sheriff, RN, MSN, BSN, CCM Care Management Coordinator [phone]

## 2023-10-08 NOTE — Patient Outreach (Signed)
  Care Coordination   Follow Up Visit Note   10/08/2023 Name: Benjamin Bryan MRN: [id number] DOB: July 22, 1928  Benjamin Bryan is a 89 y.o. year old male who sees Everrett Coombe, DO for primary care. I spoke with  Benjamin Lamas Dominski by phone today.  What matters to the patients health and wellness today?  Mr. Epperly reports he is doing well. He denies any questions or concerns a this time. He reports he has his medications and is taking as prescribed. He request follow up call from [facility] after the first of the year.   Goals Addressed             This Visit's Progress    Health Management       Interventions Today    Flowsheet Row Most Recent Value  Chronic Disease   Chronic disease during today's visit Other, Hypertension (HTN)  [h/o spontaneous pneumothorax]  General Interventions   General Interventions Discussed/Reviewed General Interventions Reviewed, Doctor Visits  [Evaluation of current treatment plan for health condition and patient's adherence to plan.]  Doctor Visits Discussed/Reviewed Doctor Visits Reviewed, PCP, Specialist  PCP/Specialist Visits Compliance with follow-up visit  [reviewed upcoming follow up appointments]  Education Interventions   Education Provided Provided Education  Provided Verbal Education On Nutrition, Medication, When to see the doctor  [advised to contact provider withhealth questions or concerns, eat healthy, attend provider appointments as recommended]  Pharmacy Interventions   Pharmacy Dicussed/Reviewed Pharmacy Topics Reviewed            SDOH assessments and interventions completed:  No  Care Coordination Interventions:  Yes, provided   Follow up plan: Follow up call scheduled for 12/09/23    Encounter Outcome:  Patient Visit Completed   Kathyrn Sheriff, RN, MSN, BSN, CCM Care Management Coordinator [phone]

## 2023-11-01 ENCOUNTER — Other Ambulatory Visit: Payer: Self-pay | Admitting: Family Medicine

## 2023-12-13 ENCOUNTER — Ambulatory Visit: Payer: Self-pay

## 2023-12-13 NOTE — Patient Outreach (Signed)
  Care Coordination   Follow Up Visit Note   12/13/2023 Name: MERVYN PFLAUM MRN: [id number] DOB: January 27, 1928  Lynwood MALVA Crisci is a 89 y.o. year old male who sees Alvia Bring, DO for primary care. I spoke with  Lynwood MALVA Cephus by phone today.  What matters to the patients health and wellness today?  Mr. Coble reports he is doing good. He denies any problems post past spontaneous pneumothorax. He reports he continues to check blood pressure routinely and denies any questions or concerns. AWV scheduled for 01/11/24 and PCP visit scheduled for 02/09/24. He reports he is doing good. He denies any medication needs. He denies any disease management, resource or care management needs. No care management needs identified at this time. Patient to contact RNCM or PCP if care management needs in the future.  Goals Addressed      COMPLETED: Health Management       Interventions Today    Flowsheet Row Most Recent Value  Chronic Disease   Chronic disease during today's visit Hypertension (HTN)  General Interventions   General Interventions Discussed/Reviewed General Interventions Reviewed, Doctor Visits, Health Screening  [Evaluation of current treatment plan for health condition and patient's adherence to plan. encouraged to contact RNCM or PCP if care management needs in the future.]  Doctor Visits Discussed/Reviewed PCP, Specialist, Annual Wellness Visits  PCP/Specialist Visits Compliance with follow-up visit  [reviewed upcoming appointments]  Education Interventions   Education Provided Provided Education  Provided Verbal Education On When to see the doctor, Medication, Nutrition  [advised to take medications as prescribed, attend provider visits as scheduled, eat healthy]  Nutrition Interventions   Nutrition Discussed/Reviewed Nutrition Discussed  Pharmacy Interventions   Pharmacy Dicussed/Reviewed Pharmacy Topics Reviewed  [medications reviewed. assess for medication management needs]       SDOH  assessments and interventions completed:  No  Care Coordination Interventions:  Yes, provided   Follow up plan: No further intervention required.   Encounter Outcome:  Patient Visit Completed   Heddy Shutter, RN, MSN, BSN, CCM Care Management Coordinator [phone]

## 2023-12-13 NOTE — Patient Instructions (Signed)
 Visit Information  Thank you for taking time to visit with me today. Please don't hesitate to contact me if I can be of assistance to you.   Following are the goals we discussed today:  Continue to take medications as prescribed. Continue to attend provider visits as scheduled Continue to eat healthy, lean meats, vegetables, fruits, avoid saturated and transfats Contact provider with health questions or concerns as needed  If you are experiencing a Mental Health or Behavioral Health Crisis or need someone to talk to, please call the Suicide and Crisis Lifeline: 988 call the [place] National Suicide Prevention Lifeline: 5730131948 or TTY: (917) 166-3313 TTY (845) 528-7345) to talk to a trained counselor  Kathyrn Sheriff, RN, MSN, BSN, CCM Care Management Coordinator [phone]

## 2023-12-29 ENCOUNTER — Ambulatory Visit: Payer: Self-pay | Admitting: Family Medicine

## 2023-12-29 DIAGNOSIS — I493 Ventricular premature depolarization: Secondary | ICD-10-CM | POA: Diagnosis not present

## 2023-12-29 DIAGNOSIS — E7800 Pure hypercholesterolemia, unspecified: Secondary | ICD-10-CM | POA: Diagnosis not present

## 2023-12-29 DIAGNOSIS — Z87891 Personal history of nicotine dependence: Secondary | ICD-10-CM | POA: Diagnosis not present

## 2023-12-29 DIAGNOSIS — R072 Precordial pain: Secondary | ICD-10-CM | POA: Diagnosis not present

## 2023-12-29 DIAGNOSIS — R079 Chest pain, unspecified: Secondary | ICD-10-CM | POA: Diagnosis not present

## 2023-12-29 DIAGNOSIS — R9431 Abnormal electrocardiogram [ECG] [EKG]: Secondary | ICD-10-CM | POA: Diagnosis not present

## 2023-12-29 DIAGNOSIS — I10 Essential (primary) hypertension: Secondary | ICD-10-CM | POA: Diagnosis not present

## 2023-12-29 DIAGNOSIS — R7989 Other specified abnormal findings of blood chemistry: Secondary | ICD-10-CM | POA: Diagnosis not present

## 2023-12-29 DIAGNOSIS — R55 Syncope and collapse: Secondary | ICD-10-CM | POA: Diagnosis not present

## 2023-12-29 DIAGNOSIS — E079 Disorder of thyroid, unspecified: Secondary | ICD-10-CM | POA: Diagnosis not present

## 2023-12-29 DIAGNOSIS — I498 Other specified cardiac arrhythmias: Secondary | ICD-10-CM | POA: Diagnosis not present

## 2023-12-29 NOTE — Telephone Encounter (Signed)
Copied from CRM [id number]. Topic: Clinical - Red Word Triage >> Dec 29, 2023  1:18 PM Nila Nephew wrote: Red Word that prompted transfer to Nurse Triage: Patient keeps standing up and trying to pass out, has been happening all morning and is getting worse.   Chief Complaint: weakness Symptoms: cough, congestion Frequency: ongoing but worse today Disposition: [x] ED /[] Urgent Care (no appt availability in office) / [] Appointment(In office/virtual)/ []  [facility]/ [] Home Care/ [] Refused Recommended Disposition /[] [facility]/ []  Follow-up with PCP Additional Notes: The patient's wife called and reported that the patient has been weak today.  She said that he almost passes out every morning but today it is worse.  Each time he has tried to get up "he goes down."  He dropped to his knees so he wouldn't fall but he has not fallen.  He just feels like he's going to pass out." This has happened at least 5 times today.  Attempted to clarify if the patient is dizzy but unable to determine.  His wife stated that he has been coughing and has had congestion so he took Coricidin.  He ate some soup today.  Advised her to call 911 and offered to call.  She stated that she wanted to get him up first.  Advised against getting him up from the bed.  She said she could take him to the ED.  Advised to call 911 to avoid hurting herself or the patient since he is unable to walk.  She verbalized understanding and stated that she would call 911 after she told him what she is going to do.    Reason for Disposition  [1] SEVERE weakness (i.e., unable to walk or barely able to walk, requires support) AND [2] new-onset or worsening  Answer Assessment - Initial Assessment Questions 1. DESCRIPTION: "Describe how you are feeling."     Weak  2. SEVERITY: "How bad is it?"  "Can you stand and walk?"   - MILD (0-3): Feels weak or tired, but does not interfere with work, school or normal activities.   - MODERATE  (4-7): Able to stand and walk; weakness interferes with work, school, or normal activities.   - SEVERE (8-10): Unable to stand or walk; unable to do usual activities.     Severe  3. ONSET: "When did these symptoms begin?" (e.g., hours, days, weeks, months)     Ongoing but worse today  4. CAUSE: "What do you think is causing the weakness or fatigue?" (e.g., not drinking enough fluids, medical problem, trouble sleeping)      5. NEW MEDICINES:  "Have you started on any new medicines recently?" (e.g., opioid pain medicines, benzodiazepines, muscle relaxants, antidepressants, antihistamines, neuroleptics, beta blockers)     Coriciden  6. OTHER SYMPTOMS: "Do you have any other symptoms?" (e.g., chest pain, fever, cough, SOB, vomiting, diarrhea, bleeding, other areas of pain)     Cough ongoing, congested  Protocols used: Weakness (Generalized) and Fatigue-A-AH

## 2024-01-02 ENCOUNTER — Other Ambulatory Visit: Payer: Self-pay | Admitting: Family Medicine

## 2024-01-05 ENCOUNTER — Ambulatory Visit (INDEPENDENT_AMBULATORY_CARE_PROVIDER_SITE_OTHER): Payer: Medicare Other | Admitting: Family Medicine

## 2024-01-05 ENCOUNTER — Encounter: Payer: Self-pay | Admitting: Family Medicine

## 2024-01-05 ENCOUNTER — Telehealth: Payer: Self-pay

## 2024-01-05 ENCOUNTER — Ambulatory Visit: Payer: Medicare Other | Attending: Family Medicine

## 2024-01-05 VITALS — BP 154/73 | HR 73 | Ht 65.0 in | Wt 131.0 lb

## 2024-01-05 DIAGNOSIS — D500 Iron deficiency anemia secondary to blood loss (chronic): Secondary | ICD-10-CM

## 2024-01-05 DIAGNOSIS — R55 Syncope and collapse: Secondary | ICD-10-CM

## 2024-01-05 DIAGNOSIS — J101 Influenza due to other identified influenza virus with other respiratory manifestations: Secondary | ICD-10-CM | POA: Diagnosis not present

## 2024-01-05 NOTE — Assessment & Plan Note (Signed)
Recurrent syncopal episodes.  Echo and zio patch ordered.

## 2024-01-05 NOTE — Assessment & Plan Note (Signed)
This is improved at this point.  He has one additional day of tamiflu.  Continue supportive care.

## 2024-01-05 NOTE — Telephone Encounter (Signed)
Copied from CRM [id number]. Topic: Clinical - Prescription Issue >> Jan 04, 2024  3:40 PM Fuller Mandril wrote: Reason for CRM: Laurelyn Sickle from [facility] needing clarification on prescription received. Need to confirm correct dose for  ipratropium (ATROVENT) 0.06 % nasal spray. Callback: [phone] Thank You

## 2024-01-05 NOTE — Progress Notes (Signed)
Benjamin Bryan - 89 y.o. male MRN [id number]  Date of birth: [birth year]  Subjective Chief Complaint  Patient presents with   Fall   Hospitalization Follow-up    HPI Benjamin Bryan is a 89 y.o. male here today for hospital follow up.   He presented to the hospital after having syncopal episode at home.  Tested positive for influenza A in the ED.  He reported that he had multiple episodes of syncope over the past month.  Worse with postural changes but reports that this can occur at random times. He has not noted palpitations.  He has had PE in the past but denies chest pain or dyspnea.  He was not admitted to the hospital and no further work up was pursued in the ED regarding his recurrent syncopal episodes.   ROS:  A comprehensive ROS was completed and negative except as noted per HPI  No Known Allergies  Past Medical History:  Diagnosis Date   Atherosclerosis of aortic bifurcation and common iliac arteries (HCC) 01/26/2018   Seen on xray 01/26/18. Normal ABI study 2018   Cancer [facility])    Basal cell carcinoma 11/18/2020   COPD (chronic obstructive pulmonary disease) (HCC) 10/03/2015   Frequent PVCs 09/11/2015   History of prostate cancer 10/03/2015   Radical prostectomy     History of pulmonary embolus (PE) 10/07/2018   Late October 2019 small clot burden   History of TIA (transient ischemic attack) 09/11/2015   Hyperlipidemia    Hypertension    Hypertensive kidney disease with stage 3 chronic kidney disease (HCC) 09/11/2015   Hypertensive urgency 08/30/2019   Hypothyroidism 09/11/2015   Iron deficiency anemia 12/09/2018   Neuralgia, neuritis, and radiculitis, unspecified 03/20/2014   Neurologic orthostatic hypotension (HCC) 09/12/2014   Shingles    Thyroid disease     Past Surgical History:  Procedure Laterality Date   radical prostectomy  1994   removed section of intestine  08/24/2005   polyp   SKIN CANCER EXCISION Left 11/18/2020   from the patient; done at Dr. Lonn [place] office;  december 2021; removed from left arm and neck    Social History   Socioeconomic History   Marital status: Married    Spouse name: [name]   Number of children: 1   Years of education: 12   Highest education level: 12th grade  Occupational History   Occupation: retired    Comment: management  Tobacco Use   Smoking status: Former    Current packs/day: 0.00    Types: Cigarettes    Quit date: 12/11/1984    Years since quitting: 39.0   Smokeless tobacco: Never  Vaping Use   Vaping status: Never Used  Substance and Sexual Activity   Alcohol use: Not Currently   Drug use: No   Sexual activity: Not Currently    Partners: Female  Other Topics Concern   Not on file  Social History Narrative   Lives with his wife. He has one child, who lives at [facility]. He does have a niece who lives close by. He enjoys eating and sleeping.   Social Drivers of Corporate investment banker Strain: Low Risk  (01/08/2023)   Overall Financial Resource Strain (CARDIA)    Difficulty of Paying Living Expenses: Not hard at all  Food Insecurity: No Food Insecurity (07/14/2023)   Hunger Vital Sign    Worried About Running Out of Food in the Last Year: Never true    Ran Out of Food in the  Last Year: Never true  Transportation Needs: No Transportation Needs (07/14/2023)   PRAPARE - Administrator, Civil Service (Medical): No    Lack of Transportation (Non-Medical): No  Physical Activity: Inactive (01/08/2023)   Exercise Vital Sign    Days of Exercise per Week: 0 days    Minutes of Exercise per Session: 0 min  Stress: No Stress Concern Present (06/23/2023)   Received from [facility] Occup[facility] - Occupational Stress Questionnaire    Feeling of Stress : Not at all  Social Connections: Unknown (06/23/2023)   Received from [facility]   Social Network    Social Network: Not on file    Family History  Problem Relation Age of Onset   Prostate cancer Father     Stroke Father     Health Maintenance  Topic Date Due   Medicare Annual Wellness (AWV)  01/09/2024   COVID-19 Vaccine (3 - Moderna risk series) 01/20/2025 (Originally 09/07/2020)   DTaP/Tdap/Td (2 - Td or Tdap) 01/27/2028   Pneumonia Vaccine 4+ Years old  Completed   INFLUENZA VACCINE  Completed   Zoster Vaccines- Shingrix  Completed   HPV VACCINES  Aged Out     ----------------------------------------------------------------------------------------------------------------------------------------------------------------------------------------------------------------- Physical Exam BP (!) 154/73 (BP Location: Left Arm, Patient Position: Sitting, Cuff Size: Normal)   Pulse 73   Ht 5\' 5"  (1.651 m)   Wt 131 lb (59.4 kg)   SpO2 100%   BMI 21.80 kg/m   Physical Exam Constitutional:      Appearance: Normal appearance.  Eyes:     General: No scleral icterus. Cardiovascular:     Rate and Rhythm: Normal rate and regular rhythm.  Pulmonary:     Effort: Pulmonary effort is normal.     Breath sounds: Normal breath sounds.  Neurological:     General: No focal deficit present.     Mental Status: He is alert.  Psychiatric:        Mood and Affect: Mood normal.        Behavior: Behavior normal.     ------------------------------------------------------------------------------------------------------------------------------------------------------------------------------------------------------------------- Assessment and Plan  Iron deficiency anemia This is improved at this point.  He has one additional day of tamiflu.  Continue supportive care.   Syncope Recurrent syncopal episodes.  Echo and zio patch ordered.    No orders of the defined types were placed in this encounter.   No follow-ups on file.    This visit occurred during the SARS-CoV-2 public health emergency.  Safety protocols were in place, including screening questions prior to the visit, additional usage of  staff PPE, and extensive cleaning of exam room while observing appropriate contact time as indicated for disinfecting solutions.

## 2024-01-05 NOTE — Telephone Encounter (Signed)
Pharmacist Boneta Lucks with [facility] states directions for atrovent  read  2 sprays in each nostril every 4 hours as needed but this would equal 6 times per day which exceeds the maximum daily dosage.  She wanted to  know if was meant to be written as  up to 4 times per day instead?

## 2024-01-05 NOTE — Progress Notes (Unsigned)
EP to read.

## 2024-01-07 NOTE — Telephone Encounter (Signed)
Pharmacy informed of corrections to directions for use of Atrovent nasal spray.

## 2024-01-21 ENCOUNTER — Ambulatory Visit (HOSPITAL_BASED_OUTPATIENT_CLINIC_OR_DEPARTMENT_OTHER)
Admission: RE | Admit: 2024-01-21 | Discharge: 2024-01-21 | Disposition: A | Discharge status: Home / Self Care | Payer: Medicare Other | Source: Ambulatory Visit | Attending: Family Medicine | Admitting: Family Medicine

## 2024-01-21 DIAGNOSIS — R55 Syncope and collapse: Secondary | ICD-10-CM | POA: Insufficient documentation

## 2024-01-21 LAB — ECHOCARDIOGRAM COMPLETE
AR max vel: 1.97 cm2
AV Area VTI: 1.97 cm2
AV Area mean vel: 1.92 cm2
AV Mean grad: 2 mm[Hg]
AV Peak grad: 3.3 mm[Hg]
AV Vena cont: 0.2 cm
Ao pk vel: 0.91 m/s
Area-P 1/2: 3.19 cm2
Calc EF: 58.3 %
MV M vel: 2.26 m/s
MV Peak grad: 20.4 mm[Hg]
P 1/2 time: 2572 ms
S' Lateral: 3 cm
Single Plane A2C EF: 61.5 %
Single Plane A4C EF: 55.7 %

## 2024-02-09 ENCOUNTER — Ambulatory Visit (INDEPENDENT_AMBULATORY_CARE_PROVIDER_SITE_OTHER): Payer: Medicare Other | Admitting: Family Medicine

## 2024-02-09 ENCOUNTER — Encounter: Payer: Self-pay | Admitting: Family Medicine

## 2024-02-09 VITALS — BP 139/62 | HR 84 | Ht 65.0 in | Wt 137.0 lb

## 2024-02-09 DIAGNOSIS — I10 Essential (primary) hypertension: Secondary | ICD-10-CM

## 2024-02-09 DIAGNOSIS — E039 Hypothyroidism, unspecified: Secondary | ICD-10-CM

## 2024-02-09 DIAGNOSIS — E079 Disorder of thyroid, unspecified: Secondary | ICD-10-CM | POA: Diagnosis not present

## 2024-02-09 MED ORDER — AMLODIPINE BESYLATE 2.5 MG PO TABS: 2.5000 mg | ORAL_TABLET | Freq: Every day | ORAL | 1 refills | Status: DC

## 2024-02-09 NOTE — Progress Notes (Signed)
 Benjamin Bryan - 89 y.o. male MRN [id number]  Date of birth: 06-09-1928  Subjective Chief Complaint  Patient presents with   Medical Management of Chronic Issues    HPI Benjamin Bryan is a 89 y.o. male here today for follow up visit.   He reports that he is doing pretty well.  He does reports having some increased dizziness at times when standing too quickly and each morning about 30 minutes after taking his medication.  BP remains pretty well controlled with amlodipine and carvedilol. He has been taking amlodipine 5mg  (whole tab) He has not had new chest pain, shortness of breath, palpitations, headache or vision changes.    ROS:  A comprehensive ROS was completed and negative except as noted per HPI    No Known Allergies  Past Medical History:  Diagnosis Date   Atherosclerosis of aortic bifurcation and common iliac arteries (HCC) 01/26/2018   Seen on xray 01/26/18. Normal ABI study 2018   Cancer [facility])    Basal cell carcinoma 11/18/2020   COPD (chronic obstructive pulmonary disease) (HCC) 10/03/2015   Frequent PVCs 09/11/2015   History of prostate cancer 10/03/2015   Radical prostectomy     History of pulmonary embolus (PE) 10/07/2018   Late October 2019 small clot burden   History of TIA (transient ischemic attack) 09/11/2015   Hyperlipidemia    Hypertension    Hypertensive kidney disease with stage 3 chronic kidney disease (HCC) 09/11/2015   Hypertensive urgency 08/30/2019   Hypothyroidism 09/11/2015   Iron deficiency anemia 12/09/2018   Neuralgia, neuritis, and radiculitis, unspecified 03/20/2014   Neurologic orthostatic hypotension (HCC) 09/12/2014   Shingles    Thyroid disease     Past Surgical History:  Procedure Laterality Date   radical prostectomy  1994   removed section of intestine  08/24/2005   polyp   SKIN CANCER EXCISION Left 11/18/2020   from the patient; done at Dr. Lonn [place] office; december 2021; removed from left arm and neck    Social History    Socioeconomic History   Marital status: Married    Spouse name: [name]   Number of children: 1   Years of education: 12   Highest education level: 12th grade  Occupational History   Occupation: retired    Comment: management  Tobacco Use   Smoking status: Former    Current packs/day: 0.00    Types: Cigarettes    Quit date: 12/11/1984    Years since quitting: 39.1   Smokeless tobacco: Never  Vaping Use   Vaping status: Never Used  Substance and Sexual Activity   Alcohol use: Not Currently   Drug use: No   Sexual activity: Not Currently    Partners: Female  Other Topics Concern   Not on file  Social History Narrative   Lives with his wife. He has one child, who lives at [facility]. He does have a niece who lives close by. He enjoys eating and sleeping.   Social Drivers of Corporate investment banker Strain: Low Risk  (01/08/2023)   Overall Financial Resource Strain (CARDIA)    Difficulty of Paying Living Expenses: Not hard at all  Food Insecurity: No Food Insecurity (07/14/2023)   Hunger Vital Sign    Worried About Running Out of Food in the Last Year: Never true    Ran Out of Food in the Last Year: Never true  Transportation Needs: No Transportation Needs (07/14/2023)   PRAPARE - Administrator, Civil Service (  Medical): No    Lack of Transportation (Non-Medical): No  Physical Activity: Inactive (01/08/2023)   Exercise Vital Sign    Days of Exercise per Week: 0 days    Minutes of Exercise per Session: 0 min  Stress: No Stress Concern Present (06/23/2023)   Received from [facility] - Occupational Stress Questionnaire    Feeling of Stress : Not at all  Social Connections: Unknown (06/23/2023)   Received from [facility]   Social Network    Social Network: Not on file    Family History  Problem Relation Age of Onset   Prostate cancer Father    Stroke Father     Health Maintenance  Topic Date Due   Medicare  Annual Wellness (AWV)  01/09/2024   COVID-19 Vaccine (3 - Moderna risk series) 01/20/2025 (Originally 09/07/2020)   DTaP/Tdap/Td (2 - Td or Tdap) 01/27/2028   Pneumonia Vaccine 85+ Years old  Completed   INFLUENZA VACCINE  Completed   Zoster Vaccines- Shingrix  Completed   HPV VACCINES  Aged Out     ----------------------------------------------------------------------------------------------------------------------------------------------------------------------------------------------------------------- Physical Exam BP 139/62 (BP Location: Left Arm, Patient Position: Sitting, Cuff Size: Normal)   Pulse 84   Ht 5\' 5"  (1.651 m)   Wt 137 lb (62.1 kg)   SpO2 96%   BMI 22.80 kg/m   Physical Exam Constitutional:      Appearance: Normal appearance.  HENT:     Head: Normocephalic and atraumatic.  Eyes:     General: No scleral icterus. Cardiovascular:     Rate and Rhythm: Normal rate and regular rhythm.  Pulmonary:     Effort: Pulmonary effort is normal.     Breath sounds: Normal breath sounds.  Musculoskeletal:     Cervical back: Neck supple.  Neurological:     Mental Status: He is alert.  Psychiatric:        Mood and Affect: Mood normal.        Behavior: Behavior normal.     ------------------------------------------------------------------------------------------------------------------------------------------------------------------------------------------------------------------- Assessment and Plan  Hypothyroidism Updating TSH today.   Essential hypertension Blood pressure is well controlled today. Reducing amlodipine some though to 2.5mg  daily as he has continued dizziness each day.  Readings are lower at home.     Meds ordered this encounter  Medications   amLODipine (NORVASC) 2.5 MG tablet    Sig: Take 1 tablet (2.5 mg total) by mouth daily.    Dispense:  90 tablet    Refill:  1    Return in about 6 months (around 08/11/2024) for Hypertension.    This  visit occurred during the SARS-CoV-2 public health emergency.  Safety protocols were in place, including screening questions prior to the visit, additional usage of staff PPE, and extensive cleaning of exam room while observing appropriate contact time as indicated for disinfecting solutions.

## 2024-02-09 NOTE — Assessment & Plan Note (Signed)
 Blood pressure is well controlled today. Reducing amlodipine some though to 2.5mg  daily as he has continued dizziness each day.  Readings are lower at home.

## 2024-02-09 NOTE — Patient Instructions (Signed)
 Take amlodipine 2.5mg  daily-I have sent a new prescription for this. STOP the 5mg  tablet.    Continue carvedilol

## 2024-02-09 NOTE — Assessment & Plan Note (Signed)
Updating TSH today. 

## 2024-02-10 LAB — TSH: TSH: 58.8 u[IU]/mL — ABNORMAL HIGH (ref 0.450–4.500)

## 2024-02-11 ENCOUNTER — Telehealth: Payer: Self-pay

## 2024-02-11 NOTE — Telephone Encounter (Signed)
 Attempted call to patient.left a voice mail message requesting a return call.

## 2024-02-11 NOTE — Telephone Encounter (Signed)
 Spoke with patient's wife he is taking currently amlodipine 2.5mg   and has been on this dose for awhile. He is stopping the medication . He is not due back by appt schld until September. Did you want him to schld a sonner appt or nurse visit for BP check to follow up after stopping Amlodipine?

## 2024-02-11 NOTE — Telephone Encounter (Signed)
 Copied from CRM [id number]. Topic: Clinical - Prescription Issue >> Feb 10, 2024  4:13 PM Nila Nephew wrote: Reason for CRM: Pharmacy is calling in to state that they are confused. Patient reported to PCP that he was taking the whole 5mg  tablet of amLODipine (NORVASC) 2.5 MG tablet. He reported to pharmacy he was only taking half. Patient reported dizziness. Pharmacy is requesting - after confirming with spouse on the line - that clarity be provided if he should take half of the 2.5 mg tablet or if there can be a further decrease in dosage, as 2.5mg  is the same as what he was previously taking (half to the 5mg ). Please advise.   Call Back to [phone].   Patient was there to pick up but did not collect medication due to clarity needed at this time.

## 2024-02-13 MED ORDER — LEVOTHYROXINE SODIUM 100 MCG PO TABS: 100.0000 ug | ORAL_TABLET | Freq: Every day | ORAL | 1 refills | Status: DC

## 2024-02-13 NOTE — Addendum Note (Signed)
 Addended by: Mammie Lorenzo on: 02/13/2024 08:51 PM   Modules accepted: Orders

## 2024-03-08 ENCOUNTER — Other Ambulatory Visit: Payer: Self-pay | Admitting: Family Medicine

## 2024-03-23 DIAGNOSIS — E079 Disorder of thyroid, unspecified: Secondary | ICD-10-CM | POA: Diagnosis not present

## 2024-03-24 LAB — TSH: TSH: 6.55 u[IU]/mL — ABNORMAL HIGH (ref 0.450–4.500)

## 2024-04-12 ENCOUNTER — Other Ambulatory Visit: Payer: Self-pay | Admitting: Family Medicine

## 2024-04-12 DIAGNOSIS — E079 Disorder of thyroid, unspecified: Secondary | ICD-10-CM

## 2024-05-15 ENCOUNTER — Other Ambulatory Visit: Payer: Self-pay | Admitting: Family Medicine

## 2024-05-15 MED ORDER — IPRATROPIUM BROMIDE 0.06 % NA SOLN: NASAL | 0 refills | Status: AC

## 2024-05-15 NOTE — Telephone Encounter (Signed)
 Copied from CRM [id number]. Topic: Clinical - Medication Refill >> May 15, 2024 10:22 AM Bearl Botts A wrote: Medication: ipratropium (ATROVENT ) 0.06 % nasal spray   Has the patient contacted their pharmacy? Yes Patient states that he cannot get it to go through.  This is the patient's preferred pharmacy:  [facility] [address], [place] - [address] [place] [address] Phone: [phone] Fax: [phone]  Is this the correct pharmacy for this prescription? Yes If no, delete pharmacy and type the correct one.   Has the prescription been filled recently? No  Is the patient out of the medication? No  Has the patient been seen for an appointment in the last year OR does the patient have an upcoming appointment? Yes  Can we respond through MyChart? No  Agent: Please be advised that Rx refills may take up to 3 business days. We ask that you follow-up with your pharmacy.

## 2024-05-15 NOTE — Telephone Encounter (Signed)
 Forwarding request to Benjamin Bryan covering Dr. Augustus Ledger Requesting rx rf of ipratropium nasal spary  Last written 03/08/2024 Last OV 03/05/225 Upcoming appt 08/11/2024 There may be two messages regarding this refill request

## 2024-06-08 ENCOUNTER — Other Ambulatory Visit: Payer: Self-pay | Admitting: General Practice

## 2024-06-13 ENCOUNTER — Other Ambulatory Visit: Payer: Self-pay | Admitting: Family Medicine

## 2024-06-13 NOTE — Telephone Encounter (Unsigned)
 Copied from CRM [id number]. Topic: Clinical - Medication Refill >> Jun 13, 2024  9:23 AM Carmell R wrote: Medication: carvedilol  (COREG ) 3.125 MG tablet  Has the patient contacted their pharmacy? No, but needs new rx. Dr Rexie rx is cancelled out  This is the patient's preferred pharmacy:  [facility] [id number] [place] - [address] [place] [address] Phone: [phone] Fax: [phone]  Is this the correct pharmacy for this prescription? Yes  Has the prescription been filled recently? Yes  Is the patient out of the medication? No but has about 5 tablets left  Has the patient been seen for an appointment in the last year OR does the patient have an upcoming appointment? Yes  Can we respond through MyChart? No  Agent: Please be advised that Rx refills may take up to 3 business days. We ask that you follow-up with your pharmacy.

## 2024-06-26 ENCOUNTER — Ambulatory Visit: Payer: Self-pay

## 2024-06-26 DIAGNOSIS — I161 Hypertensive emergency: Secondary | ICD-10-CM | POA: Diagnosis not present

## 2024-06-26 DIAGNOSIS — I10 Essential (primary) hypertension: Secondary | ICD-10-CM | POA: Diagnosis not present

## 2024-06-26 DIAGNOSIS — Z87891 Personal history of nicotine dependence: Secondary | ICD-10-CM | POA: Diagnosis not present

## 2024-06-26 DIAGNOSIS — I080 Rheumatic disorders of both mitral and aortic valves: Secondary | ICD-10-CM | POA: Diagnosis not present

## 2024-06-26 DIAGNOSIS — Z7409 Other reduced mobility: Secondary | ICD-10-CM | POA: Diagnosis not present

## 2024-06-26 DIAGNOSIS — N1831 Chronic kidney disease, stage 3a: Secondary | ICD-10-CM | POA: Diagnosis not present

## 2024-06-26 DIAGNOSIS — E785 Hyperlipidemia, unspecified: Secondary | ICD-10-CM | POA: Diagnosis not present

## 2024-06-26 DIAGNOSIS — Z79899 Other long term (current) drug therapy: Secondary | ICD-10-CM | POA: Diagnosis not present

## 2024-06-26 DIAGNOSIS — Z8673 Personal history of transient ischemic attack (TIA), and cerebral infarction without residual deficits: Secondary | ICD-10-CM | POA: Diagnosis not present

## 2024-06-26 DIAGNOSIS — J438 Other emphysema: Secondary | ICD-10-CM | POA: Diagnosis not present

## 2024-06-26 DIAGNOSIS — I639 Cerebral infarction, unspecified: Secondary | ICD-10-CM | POA: Insufficient documentation

## 2024-06-26 DIAGNOSIS — I6502 Occlusion and stenosis of left vertebral artery: Secondary | ICD-10-CM | POA: Diagnosis not present

## 2024-06-26 DIAGNOSIS — I672 Cerebral atherosclerosis: Secondary | ICD-10-CM | POA: Diagnosis not present

## 2024-06-26 DIAGNOSIS — I6523 Occlusion and stenosis of bilateral carotid arteries: Secondary | ICD-10-CM | POA: Diagnosis not present

## 2024-06-26 DIAGNOSIS — D509 Iron deficiency anemia, unspecified: Secondary | ICD-10-CM | POA: Diagnosis not present

## 2024-06-26 DIAGNOSIS — E039 Hypothyroidism, unspecified: Secondary | ICD-10-CM | POA: Diagnosis not present

## 2024-06-26 DIAGNOSIS — Z66 Do not resuscitate: Secondary | ICD-10-CM | POA: Diagnosis not present

## 2024-06-26 DIAGNOSIS — R29702 NIHSS score 2: Secondary | ICD-10-CM | POA: Diagnosis not present

## 2024-06-26 DIAGNOSIS — Z7982 Long term (current) use of aspirin: Secondary | ICD-10-CM | POA: Diagnosis not present

## 2024-06-26 DIAGNOSIS — I129 Hypertensive chronic kidney disease with stage 1 through stage 4 chronic kidney disease, or unspecified chronic kidney disease: Secondary | ICD-10-CM | POA: Diagnosis not present

## 2024-06-26 DIAGNOSIS — R4781 Slurred speech: Secondary | ICD-10-CM | POA: Diagnosis not present

## 2024-06-26 DIAGNOSIS — J8410 Pulmonary fibrosis, unspecified: Secondary | ICD-10-CM | POA: Diagnosis not present

## 2024-06-26 NOTE — Telephone Encounter (Signed)
 FYI Only or Action Required?: FYI only for provider.  Patient was last seen in primary care on 02/09/2024 by Alvia Bring, DO.  Called Nurse Triage reporting Neurologic Problem.  Symptoms began several days ago.  Interventions attempted: Nothing.  Symptoms are: unchanged.  Triage Disposition: Go to ED Now (Notify PCP)  Patient/caregiver understands and will follow disposition?: Yes  Pt's wife stating difficulty walking and trouble with speech has been going on since Thurdsay. EMS was called Thursday AM d/t pt unable to get up and paramedics put pt back to bed. Wife states sx still ongoing. Advised to take pt to ED, if she felt comfortable or could call EMS. She states she would try to get him there.   Copied from CRM [id number]. Topic: Clinical - Red Word Triage >> Jun 26, 2024  9:06 AM Merlynn LABOR wrote: Red Word that prompted transfer to Nurse Triage: Unable to Walk, talk since Thursday. Reason for Disposition  [1] Loss of speech or garbled speech AND [2] sudden onset AND [3] brief (now gone)  Answer Assessment - Initial Assessment Questions 1. SYMPTOM: What is the main symptom you are concerned about? (e.g., weakness, numbness)     Weakness  2. ONSET: When did this start? (e.g., minutes, hours, days; while sleeping)     Thursday  4. PATTERN Does this come and go, or has it been constant since it started?  Is it present now?     Comes and goes  5. CARDIAC SYMPTOMS: Have you had any of the following symptoms: chest pain, difficulty breathing, palpitations?     no 6. NEUROLOGIC SYMPTOMS: Have you had any of the following symptoms: headache, dizziness, vision loss, double vision, changes in speech, unsteady on your feet?     Difficulty with speech and unable to walk at times 7. OTHER SYMPTOMS: Do you have any other symptoms?     N/A  Pt wife called EMS Thursday AM and they put him back in bed.  Protocols used: Neurologic Deficit-A-AH

## 2024-06-27 DIAGNOSIS — R4781 Slurred speech: Secondary | ICD-10-CM | POA: Diagnosis not present

## 2024-06-28 NOTE — Progress Notes (Signed)
 Discussed with [facility] neurology--  Patient has extensive cerebral atherosclerotic disease here with acute CVA, Plavix assay drawn yesterday morning showed he was not a responder with loading 300mg  dose of Plavix administered ~7 hours prior to lab draw. We could re-load with Plavix and repeat assay in AM versus switching to Brilinta.   At discharge he will need 90 days of DAPT likely with Brilinta 90mg  BID + 81mg  ASA, after 90 days he will need to stay on 81mg  ASA for life. Lipitor 40mg  qHS (goal LDL <70 and currently 100).

## 2024-06-28 NOTE — Telephone Encounter (Signed)
Patient admitted into the hospital.

## 2024-06-29 ENCOUNTER — Telehealth: Payer: Self-pay

## 2024-06-29 NOTE — Transitions of Care (Post Inpatient/ED Visit) (Signed)
 06/29/2024  Name: Benjamin Bryan MRN: [id number] DOB: 1928-08-04  Today's TOC FU Call Status: Today's TOC FU Call Status:: Successful TOC FU Call Completed TOC FU Call Complete Date: 06/29/24 Patient's Name and Date of Birth confirmed.  Transition Care Management Follow-up Telephone Call Date of Discharge: 06/28/24 Discharge Facility: Other [facility]) Name of Other (Non[facility]) Discharge Facility: [facility] Type of Discharge: Inpatient Admission Primary Inpatient Discharge Diagnosis:: Cerebrovascular accident; unspecified mechanism How have you been since you were released from the hospital?: Same Any questions or concerns?: No  Items Reviewed: Did you receive and understand the discharge instructions provided?: Yes Medications obtained,verified, and reconciled?: Yes (Medications Reviewed) Any new allergies since your discharge?: No Dietary orders reviewed?: NA Do you have support at home?: Yes People in Home [RPT]: spouse, child(ren), adult Name of Support/Comfort Primary Source: spouse and adult daughter and adult niece and next door neighbor can all help if needed  Medications Reviewed Today: Medications Reviewed Today     Reviewed by Lauro Shona LABOR, RN (Registered Nurse) on 06/29/24 at 1159  Med List Status: <None>   Medication Order Taking? Sig Documenting Provider Last Dose Status Informant  AMBULATORY NON FORMULARY MEDICATION [id number]  Medication Name: Blood Pressure cuff with monitor.  Patient not taking: Reported on 06/29/2024   Oris Camie BRAVO, NP  Active   amLODipine  (NORVASC ) 2.5 MG tablet [id number]  Take 1 tablet (2.5 mg total) by mouth daily.  Patient not taking: Reported on 06/29/2024   Alvia Bring, DO  Consider Medication Status and Discontinue   aspirin  EC 81 MG tablet [id number] Yes Take 81 mg by mouth daily. Swallow whole. [provider]  Active   atorvastatin  (LIPITOR) 40 MG tablet [id number] Yes Take 40 mg by mouth  daily. [provider]  Active   carvedilol  (COREG ) 3.125 MG tablet [id number] Yes Take 1 tablet (3.125 mg total) by mouth 2 (two) times daily with a meal. Please call [phone] to schedule an overdue appointment for future refills. Thank you. 1st attempt.  Patient taking differently: Take 6.25 mg by mouth 2 (two) times daily with a meal. Patient discharged from [facility] 723/25 and new dose per discharge is 6.25mg  2x/day   Emelia Josefa HERO, NP  Active   famotidine (PEPCID) 20 MG tablet [id number]  Take 20 mg by mouth daily.  Patient not taking: Reported on 06/29/2024   [provider]  Consider Medication Status and Discontinue (Change in therapy) Self  ipratropium (ATROVENT ) 0.06 % nasal spray [id number] Yes USE 2 SPRAY(S) IN EACH NOSTRIL EVERY 4 HOURS AS NEEDED Willo Mini, NP  Active   ketoconazole (NIZORAL) 2 % shampoo [id number] Yes Apply topically. [provider]  Active   lansoprazole (PREVACID) 15 MG capsule [id number] Yes Take 15 mg by mouth daily at 12 noon. [provider]  Active   levothyroxine  (SYNTHROID ) 100 MCG tablet [id number] Yes Take 1 tablet (100 mcg total) by mouth daily before breakfast. Alvia Bring, DO  Active             Home Care and Equipment/Supplies: Were Home Health Services Ordered?: Yes Name of Home Health Agency:: [facility] Has Agency set up a time to come to your home?: No (called [place] liasion at [phone] - left message to call wife - received call back from [place] who will schedule with wife) Any new equipment or medical supplies ordered?: No  Functional Questionnaire: Do you need assistance with bathing/showering or dressing?: Yes (wife states they have  a tub shower so he is just washing off) Do you need assistance with meal preparation?: Yes Do you need assistance with eating?: No Do you have difficulty maintaining continence: No Do you need assistance with getting out of bed/getting out  of a chair/moving?: Yes (walks with walker) Do you have difficulty managing or taking your medications?: Yes (wife assists)  Follow up appointments reviewed: PCP Follow-up appointment confirmed?: Yes Date of PCP follow-up appointment?: 07/06/24 Follow-up Provider: Dr Alvia [facility] Follow-up appointment confirmed?: NA Do you need transportation to your follow-up appointment?: No Do you understand care options if your condition(s) worsen?: Yes-patient verbalized understanding  SDOH Interventions Today    Flowsheet Row Most Recent Value  SDOH Interventions   Food Insecurity Interventions Intervention Not Indicated  Housing Interventions Intervention Not Indicated  Transportation Interventions Intervention Not Indicated  Utilities Interventions Intervention Not Indicated    Goals Addressed             This Visit's Progress    VBCI Transitions of Care (TOC) Care Plan       Problems:  Recent Hospitalization for treatment of Cerebrovascular accident; unspecified mechanism Medication access barrier Wife reports she picked up the 30 day supply of Brilinta but felt it was expensive - over $280 - message was sent to PCP asking if he can provide samples as this is short term per discharge instruction stating : Pt will need 90 days of DAPT likely with Brilinta 90mg  BID + 81mg  ASA, after 90 days he will need to stay on 81mg  ASA for life. Lipitor 40mg  qHS (goal LDL <70 and currently 100)  - Will await response from PCP and refer pharmacy if needed   Goal:  Over the next 30 days, the patient will not experience hospital readmission  Interventions:  Transitions of Care: Doctor Visits  - discussed the importance of doctor visits Arranged PCP follow-up within 12-14 days (Care Guide Scheduled) Contacted Health RN/OT/PT - [place] # [phone] who will call wife to schedule home health services Message sent to PCP in EPIC as follows:  Dr Alvia - patient d/c  from [facility] 7/23 - CVA - New on Aspirin , Atorvastatin , Brilinta and was prescribed for 30 days. However note in chart states: Pt will need 90 days of DAPT likely with Brilinta 90mg  BID + 81mg  ASA, after 90 days he will need to stay on 81mg  ASA for life. Lipitor 40mg  qHS (goal LDL <70 and currently 100). I explained to wife that you will review at the hospital follow up appt we scheduled and you can write for additional refills - wife voiced concern with cost of Brilinta - Are you able to provide the 60 more days in samples? Discharge also started ambulatory referral to Neuro - wife states they will need referral  Stroke: Reviewed Importance of taking all medications as prescribed Reviewed Importance of attending all scheduled provider appointments Advised to report any changes in symptoms or exercise tolerance Assessed social determinant of health barriers Assessed for signs and symptoms of stroke  Patient Self Care Activities:  Attend all scheduled provider appointments Call pharmacy for medication refills 3-7 days in advance of running out of medications Call provider office for new concerns or questions  Notify RN Care Manager of TOC call rescheduling needs Participate in Transition of Care Program/Attend TOC scheduled calls Take medications as prescribed   Monitor for signs and symptoms of stroke and seek medication treatment for signs including:   facial weakness, arm weakness, speech  difficulties, vision problems, severe headache  Plan:  Telephone follow up appointment with care management team member scheduled for:  07/05/24 2pm The patient has been provided with contact information for the care management team and has been advised to call with any health related questions or concerns.         Shona Prow RN, CCM [place]  VBCI-Population Health RN Care Manager [phone]

## 2024-07-01 DIAGNOSIS — D509 Iron deficiency anemia, unspecified: Secondary | ICD-10-CM | POA: Diagnosis not present

## 2024-07-01 DIAGNOSIS — M159 Polyosteoarthritis, unspecified: Secondary | ICD-10-CM | POA: Diagnosis not present

## 2024-07-01 DIAGNOSIS — I951 Orthostatic hypotension: Secondary | ICD-10-CM | POA: Diagnosis not present

## 2024-07-01 DIAGNOSIS — I69322 Dysarthria following cerebral infarction: Secondary | ICD-10-CM | POA: Diagnosis not present

## 2024-07-01 DIAGNOSIS — I129 Hypertensive chronic kidney disease with stage 1 through stage 4 chronic kidney disease, or unspecified chronic kidney disease: Secondary | ICD-10-CM | POA: Diagnosis not present

## 2024-07-01 DIAGNOSIS — J438 Other emphysema: Secondary | ICD-10-CM | POA: Diagnosis not present

## 2024-07-01 DIAGNOSIS — E039 Hypothyroidism, unspecified: Secondary | ICD-10-CM | POA: Diagnosis not present

## 2024-07-01 DIAGNOSIS — Z7902 Long term (current) use of antithrombotics/antiplatelets: Secondary | ICD-10-CM | POA: Diagnosis not present

## 2024-07-01 DIAGNOSIS — N1832 Chronic kidney disease, stage 3b: Secondary | ICD-10-CM | POA: Diagnosis not present

## 2024-07-01 DIAGNOSIS — Z86711 Personal history of pulmonary embolism: Secondary | ICD-10-CM | POA: Diagnosis not present

## 2024-07-01 DIAGNOSIS — Z87891 Personal history of nicotine dependence: Secondary | ICD-10-CM | POA: Diagnosis not present

## 2024-07-01 DIAGNOSIS — E785 Hyperlipidemia, unspecified: Secondary | ICD-10-CM | POA: Diagnosis not present

## 2024-07-01 DIAGNOSIS — I69320 Aphasia following cerebral infarction: Secondary | ICD-10-CM | POA: Diagnosis not present

## 2024-07-01 DIAGNOSIS — I69391 Dysphagia following cerebral infarction: Secondary | ICD-10-CM | POA: Diagnosis not present

## 2024-07-03 ENCOUNTER — Telehealth: Payer: Self-pay | Admitting: *Deleted

## 2024-07-03 NOTE — Telephone Encounter (Signed)
 Copied from CRM [id number]. Topic: General - Other >> Jul 03, 2024  2:26 PM Kevelyn M wrote: Reason for CRM: Discharged from hospital 06/28/2024 Admitted on 06/26/2024 (Patient had slurred speech and difficulty walking) Star with [facility] pallative care calling to let provider know that patient will be receiving pallative care. Call back # [phone]

## 2024-07-03 NOTE — Telephone Encounter (Signed)
 Routing encounter to Dr. Alvia as an RICK.

## 2024-07-05 ENCOUNTER — Telehealth: Payer: Self-pay

## 2024-07-05 DIAGNOSIS — I951 Orthostatic hypotension: Secondary | ICD-10-CM | POA: Diagnosis not present

## 2024-07-05 DIAGNOSIS — I69322 Dysarthria following cerebral infarction: Secondary | ICD-10-CM | POA: Diagnosis not present

## 2024-07-05 DIAGNOSIS — I69391 Dysphagia following cerebral infarction: Secondary | ICD-10-CM | POA: Diagnosis not present

## 2024-07-05 DIAGNOSIS — N1832 Chronic kidney disease, stage 3b: Secondary | ICD-10-CM | POA: Diagnosis not present

## 2024-07-05 DIAGNOSIS — E785 Hyperlipidemia, unspecified: Secondary | ICD-10-CM | POA: Diagnosis not present

## 2024-07-05 DIAGNOSIS — Z87891 Personal history of nicotine dependence: Secondary | ICD-10-CM | POA: Diagnosis not present

## 2024-07-05 DIAGNOSIS — D509 Iron deficiency anemia, unspecified: Secondary | ICD-10-CM | POA: Diagnosis not present

## 2024-07-05 DIAGNOSIS — I69320 Aphasia following cerebral infarction: Secondary | ICD-10-CM | POA: Diagnosis not present

## 2024-07-05 DIAGNOSIS — I129 Hypertensive chronic kidney disease with stage 1 through stage 4 chronic kidney disease, or unspecified chronic kidney disease: Secondary | ICD-10-CM | POA: Diagnosis not present

## 2024-07-05 DIAGNOSIS — J438 Other emphysema: Secondary | ICD-10-CM | POA: Diagnosis not present

## 2024-07-05 DIAGNOSIS — M159 Polyosteoarthritis, unspecified: Secondary | ICD-10-CM | POA: Diagnosis not present

## 2024-07-05 DIAGNOSIS — Z7902 Long term (current) use of antithrombotics/antiplatelets: Secondary | ICD-10-CM | POA: Diagnosis not present

## 2024-07-05 DIAGNOSIS — E039 Hypothyroidism, unspecified: Secondary | ICD-10-CM | POA: Diagnosis not present

## 2024-07-05 DIAGNOSIS — Z86711 Personal history of pulmonary embolism: Secondary | ICD-10-CM | POA: Diagnosis not present

## 2024-07-06 ENCOUNTER — Telehealth: Payer: Self-pay

## 2024-07-06 DIAGNOSIS — Z7902 Long term (current) use of antithrombotics/antiplatelets: Secondary | ICD-10-CM | POA: Diagnosis not present

## 2024-07-06 DIAGNOSIS — I69391 Dysphagia following cerebral infarction: Secondary | ICD-10-CM | POA: Diagnosis not present

## 2024-07-06 DIAGNOSIS — I69322 Dysarthria following cerebral infarction: Secondary | ICD-10-CM | POA: Diagnosis not present

## 2024-07-06 DIAGNOSIS — E785 Hyperlipidemia, unspecified: Secondary | ICD-10-CM | POA: Diagnosis not present

## 2024-07-06 DIAGNOSIS — I69320 Aphasia following cerebral infarction: Secondary | ICD-10-CM | POA: Diagnosis not present

## 2024-07-06 DIAGNOSIS — Z86711 Personal history of pulmonary embolism: Secondary | ICD-10-CM | POA: Diagnosis not present

## 2024-07-06 DIAGNOSIS — M159 Polyosteoarthritis, unspecified: Secondary | ICD-10-CM | POA: Diagnosis not present

## 2024-07-06 DIAGNOSIS — N1832 Chronic kidney disease, stage 3b: Secondary | ICD-10-CM | POA: Diagnosis not present

## 2024-07-06 DIAGNOSIS — J438 Other emphysema: Secondary | ICD-10-CM | POA: Diagnosis not present

## 2024-07-06 DIAGNOSIS — I129 Hypertensive chronic kidney disease with stage 1 through stage 4 chronic kidney disease, or unspecified chronic kidney disease: Secondary | ICD-10-CM | POA: Diagnosis not present

## 2024-07-06 DIAGNOSIS — D509 Iron deficiency anemia, unspecified: Secondary | ICD-10-CM | POA: Diagnosis not present

## 2024-07-06 DIAGNOSIS — E039 Hypothyroidism, unspecified: Secondary | ICD-10-CM | POA: Diagnosis not present

## 2024-07-06 DIAGNOSIS — I951 Orthostatic hypotension: Secondary | ICD-10-CM | POA: Diagnosis not present

## 2024-07-06 DIAGNOSIS — Z87891 Personal history of nicotine dependence: Secondary | ICD-10-CM | POA: Diagnosis not present

## 2024-07-06 NOTE — Telephone Encounter (Signed)
 Can this serve as verbal verification / approval? Or does anything else need to be done?

## 2024-07-06 NOTE — Telephone Encounter (Signed)
 Forwarding message to [facility] covering Dr. Alvia

## 2024-07-06 NOTE — Telephone Encounter (Signed)
 Copied from CRM [id number]. Topic: Clinical - Home Health Verbal Orders >> Jul 06, 2024  2:02 PM Susanna ORN wrote: Caller/Agency: Channing Stanford, Speech therapist w/[facility] Callback Number: [phone] Service Requested: Speech Therapy Frequency: requesting 6 additional visits for speech therapy; patient has aphasia because of the stroke.  Any new concerns about the patient? No

## 2024-07-06 NOTE — Telephone Encounter (Signed)
 Channing  with [facility] home health informed of verbal approval

## 2024-07-07 ENCOUNTER — Telehealth: Payer: Self-pay

## 2024-07-07 DIAGNOSIS — I69391 Dysphagia following cerebral infarction: Secondary | ICD-10-CM | POA: Diagnosis not present

## 2024-07-07 DIAGNOSIS — I129 Hypertensive chronic kidney disease with stage 1 through stage 4 chronic kidney disease, or unspecified chronic kidney disease: Secondary | ICD-10-CM | POA: Diagnosis not present

## 2024-07-07 DIAGNOSIS — E039 Hypothyroidism, unspecified: Secondary | ICD-10-CM | POA: Diagnosis not present

## 2024-07-07 DIAGNOSIS — M159 Polyosteoarthritis, unspecified: Secondary | ICD-10-CM | POA: Diagnosis not present

## 2024-07-07 DIAGNOSIS — D509 Iron deficiency anemia, unspecified: Secondary | ICD-10-CM | POA: Diagnosis not present

## 2024-07-07 DIAGNOSIS — I951 Orthostatic hypotension: Secondary | ICD-10-CM | POA: Diagnosis not present

## 2024-07-07 DIAGNOSIS — Z86711 Personal history of pulmonary embolism: Secondary | ICD-10-CM | POA: Diagnosis not present

## 2024-07-07 DIAGNOSIS — Z7902 Long term (current) use of antithrombotics/antiplatelets: Secondary | ICD-10-CM | POA: Diagnosis not present

## 2024-07-07 DIAGNOSIS — E785 Hyperlipidemia, unspecified: Secondary | ICD-10-CM | POA: Diagnosis not present

## 2024-07-07 DIAGNOSIS — N1832 Chronic kidney disease, stage 3b: Secondary | ICD-10-CM | POA: Diagnosis not present

## 2024-07-07 DIAGNOSIS — I69322 Dysarthria following cerebral infarction: Secondary | ICD-10-CM | POA: Diagnosis not present

## 2024-07-07 DIAGNOSIS — I69320 Aphasia following cerebral infarction: Secondary | ICD-10-CM | POA: Diagnosis not present

## 2024-07-07 DIAGNOSIS — Z87891 Personal history of nicotine dependence: Secondary | ICD-10-CM | POA: Diagnosis not present

## 2024-07-07 DIAGNOSIS — J438 Other emphysema: Secondary | ICD-10-CM | POA: Diagnosis not present

## 2024-07-07 NOTE — Transitions of Care (Post Inpatient/ED Visit) (Signed)
 Transition of Care week 2  Visit Note  07/07/2024  Name: Benjamin Bryan MRN: [id number]          DOB: 05/13/1928  Situation: Patient enrolled in [facility] 30-day program. Visit completed with patient and wife by telephone.   Background: Admit/Discharge Date  7/21 - 7/23 [facility]   Primary Diagnosis: Cerebrovascular accident; unspecified mechanism  Initial Transition Care Management Follow-up Telephone Call    Past Medical History:  Diagnosis Date   Atherosclerosis of aortic bifurcation and common iliac arteries (HCC) 01/26/2018   Seen on xray 01/26/18. Normal ABI study 2018   Cancer [facility])    Basal cell carcinoma 11/18/2020   COPD (chronic obstructive pulmonary disease) (HCC) 10/03/2015   Frequent PVCs 09/11/2015   History of prostate cancer 10/03/2015   Radical prostectomy     History of pulmonary embolus (PE) 10/07/2018   Late October 2019 small clot burden   History of TIA (transient ischemic attack) 09/11/2015   Hyperlipidemia    Hypertension    Hypertensive kidney disease with stage 3 chronic kidney disease (HCC) 09/11/2015   Hypertensive urgency 08/30/2019   Hypothyroidism 09/11/2015   Iron deficiency anemia 12/09/2018   Neuralgia, neuritis, and radiculitis, unspecified 03/20/2014   Neurologic orthostatic hypotension (HCC) 09/12/2014   Shingles    Thyroid  disease     Assessment: Patient Reported Symptoms: Cognitive Cognitive Status: Confused or disoriented, Struggling with memory recall (Patient stated the President of the [place]  is Levy and said the year is 58 - TOC RN spoke with OT who arrived during call and said ST saw him yesterday and will be seeing him to help with cognitive)      Neurological Neurological Review of Symptoms: Weakness Neurological Comment: Patient ongoing right leg weakness s/p CVA - working with OT, PT, ST  HEENT HEENT Symptoms Reported: Not assessed      Cardiovascular Cardiovascular Symptoms Reported: No symptoms reported Weight: 120 lb  (54.4 kg) (wife reports weight around 120lbs)  Respiratory Respiratory Symptoms Reported: No symptoms reported    Endocrine Endocrine Symptoms Reported: Not assessed    Gastrointestinal Gastrointestinal Symptoms Reported: No symptoms reported      Genitourinary Genitourinary Symptoms Reported: No symptoms reported    Integumentary Integumentary Symptoms Reported: Bruising    Musculoskeletal Musculoskelatal Symptoms Reviewed: Weakness Additional Musculoskeletal Details: patient reports he continues to have right leg weakness-using walker and is working with PT and OT is coming today - patient reports he does the exercies as instructed when they are not in the home   Falls in the past year?: Yes Number of falls in past year: 1 or less Was there an injury with Fall?: No Fall Risk Category Calculator: 1 Patient Fall Risk Level: Low Fall Risk Patient at Risk for Falls Due to: History of fall(s) Fall risk Follow up: Falls prevention discussed  Psychosocial Psychosocial Symptoms Reported: No symptoms reported         There were no vitals filed for this visit.  Medications Reviewed Today     Reviewed by Lauro Shona LABOR, RN (Registered Nurse) on 07/07/24 at 1108  Med List Status: <None>   Medication Order Taking? Sig Documenting Provider Last Dose Status Informant  AMBULATORY NON FORMULARY MEDICATION [id number]  Medication Name: Blood Pressure cuff with monitor.  Patient not taking: Reported on 07/07/2024   Early, Sara E, NP  Active   amLODipine  (NORVASC ) 2.5 MG tablet [id number]  Take 1 tablet (2.5 mg total) by mouth daily.  Patient not taking: Reported on  07/07/2024   Alvia Bring, DO  Active   aspirin  EC 81 MG tablet [id number] Yes Take 81 mg by mouth daily. Swallow whole. [provider]  Active   atorvastatin  (LIPITOR) 40 MG tablet [id number] Yes Take 40 mg by mouth daily. [provider]  Active   carvedilol  (COREG ) 3.125 MG tablet [id number] Yes Take 1 tablet  (3.125 mg total) by mouth 2 (two) times daily with a meal. Please call [phone] to schedule an overdue appointment for future refills. Thank you. 1st attempt. Emelia Josefa HERO, NP  Active   famotidine (PEPCID) 20 MG tablet [id number] Yes Take 20 mg by mouth daily. [provider]  Active Self  ipratropium (ATROVENT ) 0.06 % nasal spray [id number] Yes USE 2 SPRAY(S) IN EACH NOSTRIL EVERY 4 HOURS AS NEEDED Willo Mini, NP  Active   ketoconazole (NIZORAL) 2 % shampoo [id number] Yes Apply topically. [provider]  Active   lansoprazole (PREVACID) 15 MG capsule [id number]  Take 15 mg by mouth daily at 12 noon.  Patient not taking: Reported on 07/07/2024   [provider]  Active   levothyroxine  (SYNTHROID ) 100 MCG tablet [id number] Yes Take 1 tablet (100 mcg total) by mouth daily before breakfast. Alvia Bring, DO  Active             Recommendation:   Continue Current Plan of Care  Follow Up Plan:   Telephone follow up appointment date/time:  07/13/24/3pm  Shona Prow RN, CCM [place]  VBCI-Population Health RN Care Manager [phone]

## 2024-07-11 ENCOUNTER — Encounter: Payer: Self-pay | Admitting: Urgent Care

## 2024-07-11 ENCOUNTER — Ambulatory Visit (INDEPENDENT_AMBULATORY_CARE_PROVIDER_SITE_OTHER): Admitting: Urgent Care

## 2024-07-11 ENCOUNTER — Other Ambulatory Visit (HOSPITAL_COMMUNITY): Payer: Self-pay

## 2024-07-11 ENCOUNTER — Inpatient Hospital Stay: Admitting: Family Medicine

## 2024-07-11 VITALS — BP 136/82 | HR 60 | Resp 18 | Ht 65.0 in | Wt 120.0 lb

## 2024-07-11 DIAGNOSIS — I129 Hypertensive chronic kidney disease with stage 1 through stage 4 chronic kidney disease, or unspecified chronic kidney disease: Secondary | ICD-10-CM | POA: Diagnosis not present

## 2024-07-11 DIAGNOSIS — I69391 Dysphagia following cerebral infarction: Secondary | ICD-10-CM | POA: Diagnosis not present

## 2024-07-11 DIAGNOSIS — I69320 Aphasia following cerebral infarction: Secondary | ICD-10-CM | POA: Diagnosis not present

## 2024-07-11 DIAGNOSIS — I951 Orthostatic hypotension: Secondary | ICD-10-CM | POA: Diagnosis not present

## 2024-07-11 DIAGNOSIS — N1832 Chronic kidney disease, stage 3b: Secondary | ICD-10-CM | POA: Diagnosis not present

## 2024-07-11 DIAGNOSIS — I6350 Cerebral infarction due to unspecified occlusion or stenosis of unspecified cerebral artery: Secondary | ICD-10-CM | POA: Diagnosis not present

## 2024-07-11 DIAGNOSIS — E785 Hyperlipidemia, unspecified: Secondary | ICD-10-CM | POA: Diagnosis not present

## 2024-07-11 DIAGNOSIS — I69322 Dysarthria following cerebral infarction: Secondary | ICD-10-CM | POA: Diagnosis not present

## 2024-07-11 DIAGNOSIS — Z87891 Personal history of nicotine dependence: Secondary | ICD-10-CM | POA: Diagnosis not present

## 2024-07-11 DIAGNOSIS — I10 Essential (primary) hypertension: Secondary | ICD-10-CM | POA: Diagnosis not present

## 2024-07-11 DIAGNOSIS — M159 Polyosteoarthritis, unspecified: Secondary | ICD-10-CM | POA: Diagnosis not present

## 2024-07-11 DIAGNOSIS — J438 Other emphysema: Secondary | ICD-10-CM | POA: Diagnosis not present

## 2024-07-11 DIAGNOSIS — E039 Hypothyroidism, unspecified: Secondary | ICD-10-CM | POA: Diagnosis not present

## 2024-07-11 DIAGNOSIS — Z86711 Personal history of pulmonary embolism: Secondary | ICD-10-CM | POA: Diagnosis not present

## 2024-07-11 DIAGNOSIS — Z7902 Long term (current) use of antithrombotics/antiplatelets: Secondary | ICD-10-CM | POA: Diagnosis not present

## 2024-07-11 DIAGNOSIS — D509 Iron deficiency anemia, unspecified: Secondary | ICD-10-CM | POA: Diagnosis not present

## 2024-07-11 MED ORDER — CARVEDILOL 6.25 MG PO TABS
6.2500 mg | ORAL_TABLET | Freq: Two times a day (BID) | ORAL | 1 refills | Status: AC
  Filled 2024-07-11 – 2024-07-31 (×2): qty 180, 90d supply, fill #0
  Filled 2024-10-23: qty 180, 90d supply, fill #1

## 2024-07-11 MED ORDER — TICAGRELOR 90 MG PO TABS
90.0000 mg | ORAL_TABLET | Freq: Two times a day (BID) | ORAL | 1 refills | Status: AC
  Filled 2024-07-11 – 2024-07-12 (×2): qty 180, 90d supply, fill #0
  Filled 2024-07-12 – 2024-07-21 (×2): qty 60, 30d supply, fill #0
  Filled 2024-08-14: qty 60, 30d supply, fill #1
  Filled 2024-09-12: qty 60, 30d supply, fill #2
  Filled ????-??-?? (×2): fill #0

## 2024-07-11 NOTE — Patient Instructions (Addendum)
 Continue carvedilol  at 6.25mg  twice daily; Continue brillinta.  Please call [facility] to set up delivery.  Follow up with neurology as set up by [facility]. Keep your follow up with Dr. Alvia on 08/11/24  Continue home health OT/PT/ ST

## 2024-07-11 NOTE — Progress Notes (Signed)
 Established Patient Office Visit  Subjective:  Patient ID: Benjamin Bryan, male    DOB: Jun 10, 1928  Age: 89 y.o. MRN: [id number]  Chief Complaint  Patient presents with   Hospitalization Follow-up    Pt daughter would like to discuss what medications pt is to continue taking, also they prescribed a blood thinner that was 200 something dollars. Pt is to take for 3 months but medication is too expensive    HPI  Discussed the use of AI scribe software for clinical note transcription with the patient, who gave verbal consent to proceed.  History of Present Illness   Benjamin Bryan is a 89 year old male who presents for follow-up after a recent stroke.  He was recently hospitalized at [facility] for a stroke, initially experiencing no symptoms but later developing an inability to walk or talk. This led to his admission on June 26, 2024, where he stayed for two nights. During hospitalization, he underwent extensive workup including head imaging, an ultrasound of the heart, a chest x-ray, and blood work.  He was switched from Plavix to Brilinta  during his hospitalization after genetic testing confirmed that the plavix was ineffective. He is concerned about the price of the brillinta, costing nearly $300/month. He is also taking aspirin  81 mg over the counter.   His blood pressure management includes carvedilol , which was increased to 6.25 mg twice daily from 3.125 mg. He has not been monitoring his blood pressure at home since discharge, but visiting healthcare providers have been checking it. No headaches or slurred speech, although sometimes his speech is not clear.  He is receiving physical, speech, and occupational therapy at home, each once a week. He has not yet scheduled a follow-up with neurology. Overall, he feels back to baseline without any acute complaints.      Patient Active Problem List   Diagnosis Date Noted   Syncope 01/05/2024   Influenza A 01/05/2024   Spontaneous  pneumothorax 07/04/2023   Subacute cough 06/23/2023   Flank pain 06/23/2023   Urinary incontinence 05/11/2022   CKD (chronic kidney disease) stage 3, GFR 30-59 ml/min (HCC) 12/26/2020   Thyroid  disease    Stasis dermatitis 02/09/2020   SK (seborrheic keratosis) 02/09/2020   Iron deficiency anemia 12/09/2018   History of pulmonary embolus (PE) 10/07/2018   Multiple subsegmental pulmonary emboli without acute cor pulmonale (HCC) 10/07/2018   Atherosclerosis of aortic bifurcation and common iliac arteries (HCC) 01/26/2018   COPD (chronic obstructive pulmonary disease) (HCC) 10/03/2015   History of prostate cancer 10/03/2015   Hypothyroidism 09/11/2015   Essential hypertension 09/11/2015   Hyperlipidemia 09/11/2015   Frequent PVCs 09/11/2015   Hypertensive kidney disease with stage 3 chronic kidney disease (HCC) 09/11/2015   History of TIA (transient ischemic attack) 09/11/2015   Neurologic orthostatic hypotension (HCC) 09/12/2014   Neuralgia, neuritis, and radiculitis, unspecified 03/20/2014   Past Medical History:  Diagnosis Date   Atherosclerosis of aortic bifurcation and common iliac arteries (HCC) 01/26/2018   Seen on xray 01/26/18. Normal ABI study 2018   Cancer [facility])    Basal cell carcinoma 11/18/2020   COPD (chronic obstructive pulmonary disease) (HCC) 10/03/2015   Frequent PVCs 09/11/2015   History of prostate cancer 10/03/2015   Radical prostectomy     History of pulmonary embolus (PE) 10/07/2018   Late October 2019 small clot burden   History of TIA (transient ischemic attack) 09/11/2015   Hyperlipidemia    Hypertension    Hypertensive kidney disease with stage 3  chronic kidney disease (HCC) 09/11/2015   Hypertensive urgency 08/30/2019   Hypothyroidism 09/11/2015   Iron deficiency anemia 12/09/2018   Neuralgia, neuritis, and radiculitis, unspecified 03/20/2014   Neurologic orthostatic hypotension (HCC) 09/12/2014   Shingles    Thyroid  disease    Past Surgical History:   Procedure Laterality Date   radical prostectomy  1994   removed section of intestine  08/24/2005   polyp   SKIN CANCER EXCISION Left 11/18/2020   from the patient; done at Dr. Vernon office; december 2021; removed from left arm and neck   Social History   Tobacco Use   Smoking status: Former    Current packs/day: 0.00    Types: Cigarettes    Quit date: 12/11/1984    Years since quitting: 39.6   Smokeless tobacco: Never  Vaping Use   Vaping status: Never Used  Substance Use Topics   Alcohol use: Not Currently   Drug use: No      ROS: as noted in HPI  Objective:     BP 136/82   Pulse 60   Resp 18   Ht 5' 5 (1.651 m)   Wt 120 lb (54.4 kg)   SpO2 100%   BMI 19.97 kg/m  BP Readings from Last 3 Encounters:  07/11/24 136/82  02/09/24 139/62  01/05/24 (!) 154/73   Wt Readings from Last 3 Encounters:  07/11/24 120 lb (54.4 kg)  07/07/24 120 lb (54.4 kg)  02/09/24 137 lb (62.1 kg)      Physical Exam Vitals and nursing note reviewed. Exam conducted with a chaperone present.  Constitutional:      General: He is not in acute distress.    Appearance: Normal appearance. He is not ill-appearing or toxic-appearing.  Eyes:     General: No scleral icterus.       Right eye: No discharge.        Left eye: No discharge.  Cardiovascular:     Rate and Rhythm: Normal rate and regular rhythm.     Heart sounds: No murmur heard.    No friction rub.  Pulmonary:     Effort: Pulmonary effort is normal. No respiratory distress.     Breath sounds: Normal breath sounds. No wheezing.  Musculoskeletal:     Left lower leg: No edema.  Skin:    General: Skin is warm and dry.     Findings: No rash.  Neurological:     Mental Status: He is alert.     Cranial Nerves: Cranial nerves 2-12 are intact. No cranial nerve deficit or facial asymmetry.     Motor: Weakness present.     Gait: Gait abnormal (using walker).      No results found for any visits on 07/11/24.  Last CBC Lab  Results  Component Value Date   WBC 7.2 07/01/2023   HGB 14.3 07/01/2023   HCT 42.9 07/01/2023   MCV 94 07/01/2023   MCH 31.4 07/01/2023   RDW 12.4 07/01/2023   PLT 334 07/01/2023   Last metabolic panel Lab Results  Component Value Date   GLUCOSE 96 07/01/2023   NA 139 07/01/2023   K 4.5 07/01/2023   CL 101 07/01/2023   CO2 21 07/01/2023   BUN 29 07/01/2023   CREATININE 1.76 (H) 07/01/2023   EGFR 36 (L) 07/01/2023   CALCIUM  9.7 07/01/2023   PROT 6.8 06/23/2023   ALBUMIN 4.2 06/23/2017   BILITOT 0.8 06/23/2023   ALKPHOS 70 08/30/2019   AST 12 06/23/2023  ALT 8 (L) 06/23/2023   Last lipids Lab Results  Component Value Date   CHOL 195 11/19/2022   HDL 60 11/19/2022   LDLCALC 119 (H) 11/19/2022   TRIG 66 11/19/2022   CHOLHDL 3.3 11/19/2022   Last hemoglobin A1c Lab Results  Component Value Date   HGBA1C 4.8 06/23/2017   Last thyroid  functions Lab Results  Component Value Date   TSH 6.550 (H) 03/23/2024   Last vitamin D No results found for: 25OHVITD2, 25OHVITD3, VD25OH Last vitamin B12 and Folate No results found for: VITAMINB12, FOLATE    The ASCVD Risk score (Arnett DK, et al., 2019) failed to calculate for the following reasons:   The 2019 ASCVD risk score is only valid for ages 39 to 87   Risk score cannot be calculated because patient has a medical history suggesting prior/existing ASCVD  Assessment & Plan:  Cerebrovascular accident (CVA) due to occlusion of cerebral artery (HCC) -     Ticagrelor ; Take 1 tablet (90 mg total) by mouth 2 (two) times daily.  Dispense: 180 tablet; Refill: 1  Essential hypertension -     Carvedilol ; Take 1 tablet (6.25 mg total) by mouth 2 (two) times daily with a meal. Please call [phone] to schedule an overdue appointment for future refills. Thank you. 1st attempt.  Dispense: 180 tablet; Refill: 1  Assessment and Plan    Cerebrovascular accident (stroke) Recent stroke with initial symptoms of inability  to walk and talk. Switched from Plavix to Brilinta  due to poor absorption. Undergoing weekly therapies. Cost of Brilinta  addressed through assistance program. - Refer to [facility] pharmacy assistance program for Brilinta  to address cost concerns. - Continue physical, speech, and occupational therapy once a week. - Schedule follow-up with neurology.  Hypertension Hypertension managed with increased carvedilol  dose. Blood pressure well-controlled. Aspirin  prescribed. - Continue carvedilol  at increased dose of 6.25 mg twice daily. - Continue aspirin  81 mg daily. - Call in carvedilol  prescription for three months supply. - Monitor blood pressure at home.      Keep follow up appointment with Dr. Alvia on 08/11/24, sooner for any acute complaints.   No follow-ups on file.   Benton LITTIE Gave, PA

## 2024-07-12 ENCOUNTER — Other Ambulatory Visit (HOSPITAL_COMMUNITY): Payer: Self-pay

## 2024-07-12 ENCOUNTER — Ambulatory Visit: Admission: EM | Admit: 2024-07-12 | Discharge: 2024-07-12 | Discharge status: Against Medical Advice

## 2024-07-12 ENCOUNTER — Ambulatory Visit: Payer: Self-pay

## 2024-07-12 ENCOUNTER — Telehealth: Payer: Self-pay

## 2024-07-12 DIAGNOSIS — D509 Iron deficiency anemia, unspecified: Secondary | ICD-10-CM | POA: Diagnosis not present

## 2024-07-12 DIAGNOSIS — I129 Hypertensive chronic kidney disease with stage 1 through stage 4 chronic kidney disease, or unspecified chronic kidney disease: Secondary | ICD-10-CM | POA: Diagnosis not present

## 2024-07-12 DIAGNOSIS — Z7902 Long term (current) use of antithrombotics/antiplatelets: Secondary | ICD-10-CM | POA: Diagnosis not present

## 2024-07-12 DIAGNOSIS — J438 Other emphysema: Secondary | ICD-10-CM | POA: Diagnosis not present

## 2024-07-12 DIAGNOSIS — I69391 Dysphagia following cerebral infarction: Secondary | ICD-10-CM | POA: Diagnosis not present

## 2024-07-12 DIAGNOSIS — E785 Hyperlipidemia, unspecified: Secondary | ICD-10-CM | POA: Diagnosis not present

## 2024-07-12 DIAGNOSIS — N1832 Chronic kidney disease, stage 3b: Secondary | ICD-10-CM | POA: Diagnosis not present

## 2024-07-12 DIAGNOSIS — M159 Polyosteoarthritis, unspecified: Secondary | ICD-10-CM | POA: Diagnosis not present

## 2024-07-12 DIAGNOSIS — I69320 Aphasia following cerebral infarction: Secondary | ICD-10-CM | POA: Diagnosis not present

## 2024-07-12 DIAGNOSIS — Z86711 Personal history of pulmonary embolism: Secondary | ICD-10-CM | POA: Diagnosis not present

## 2024-07-12 DIAGNOSIS — E039 Hypothyroidism, unspecified: Secondary | ICD-10-CM | POA: Diagnosis not present

## 2024-07-12 DIAGNOSIS — Z87891 Personal history of nicotine dependence: Secondary | ICD-10-CM | POA: Diagnosis not present

## 2024-07-12 DIAGNOSIS — I951 Orthostatic hypotension: Secondary | ICD-10-CM | POA: Diagnosis not present

## 2024-07-12 DIAGNOSIS — I69322 Dysarthria following cerebral infarction: Secondary | ICD-10-CM | POA: Diagnosis not present

## 2024-07-12 NOTE — Telephone Encounter (Addendum)
  FYI Only or Action Required?: FYI only for provider.  Patient was last seen in primary care on 07/11/2024 by Lowella Benton CROME, PA.  Called Nurse Triage reporting Epistaxis.  Symptoms began today.  Interventions attempted: Other: direct pressure.  Symptoms are: unchanged.  Triage Disposition: See Physician Within 24 Hours  Patient/caregiver understands and will follow disposition?: Benjamin Bryan (Speech therapist) called  for pt and pt's wife (present during call) pt started with nosebleed this am and has been unable to get nosebleed to stop.  Care advice given and  pt or UC. Benjamin Bryan stated she will  stay with pt to see if pinching the nose per care advice doesn't work. No appt available at PCP office.              Reason for Disposition  Taking Coumadin (warfarin) or other strong blood thinner, or known bleeding disorder (e.g., thrombocytopenia)  Answer Assessment - Initial Assessment Questions 1. AMOUNT OF BLEEDING: How bad is the bleeding? How much blood was lost? Has the bleeding stopped?     1 clot- mild moderate 2. ONSET: When did the nosebleed start?      0815  3. FREQUENCY: How many nosebleeds have you had in the last 24 hours?      *No Answer* 4. RECURRENT SYMPTOMS: Have there been other recent nosebleeds? If Yes, ask: How long did it take you to stop the bleeding? What worked best?      4 months-  5. CAUSE: What do you think caused this nosebleed?     Blood thinners 6. LOCAL FACTORS: Do you have any cold symptoms?, Have you been rubbing or picking at your nose?     no 7. SYSTEMIC FACTORS: Do you have high blood pressure or any bleeding problems?     On blood thinners  8. BLOOD THINNERS: Do you take any blood thinners? (e.g., aspirin , clopidogrel / Plavix, coumadin, heparin). Notes: Other strong blood thinners include: Arixtra (fondaparinux), Eliquis  (apixaban ), Pradaxa (dabigatran), and Xarelto (rivaroxaban).     yes 9. OTHER SYMPTOMS: Do you  have any other symptoms? (e.g., lightheadedness)     No  Protocols used: Nosebleed-A-AH

## 2024-07-12 NOTE — Telephone Encounter (Signed)
 Copied from CRM [id number]. Topic: Clinical - Medical Advice >> Jul 12, 2024  2:16 PM Kevelyn M wrote: Caller/Agency: [facility] Callback Number: [phone]  Could not get the patient's nose to stop bleeding so they are on their way to urgent care. Wanted to let triage nurse know. They are going to the Urgent care on [address].

## 2024-07-12 NOTE — Telephone Encounter (Signed)
 Forwarding message to Christen Butter, NP covering Dr. Ashley Royalty

## 2024-07-13 ENCOUNTER — Telehealth: Payer: Self-pay

## 2024-07-13 DIAGNOSIS — Z87891 Personal history of nicotine dependence: Secondary | ICD-10-CM | POA: Diagnosis not present

## 2024-07-13 DIAGNOSIS — I951 Orthostatic hypotension: Secondary | ICD-10-CM | POA: Diagnosis not present

## 2024-07-13 DIAGNOSIS — Z86711 Personal history of pulmonary embolism: Secondary | ICD-10-CM | POA: Diagnosis not present

## 2024-07-13 DIAGNOSIS — M159 Polyosteoarthritis, unspecified: Secondary | ICD-10-CM | POA: Diagnosis not present

## 2024-07-13 DIAGNOSIS — N1832 Chronic kidney disease, stage 3b: Secondary | ICD-10-CM | POA: Diagnosis not present

## 2024-07-13 DIAGNOSIS — J438 Other emphysema: Secondary | ICD-10-CM | POA: Diagnosis not present

## 2024-07-13 DIAGNOSIS — I69322 Dysarthria following cerebral infarction: Secondary | ICD-10-CM | POA: Diagnosis not present

## 2024-07-13 DIAGNOSIS — Z7902 Long term (current) use of antithrombotics/antiplatelets: Secondary | ICD-10-CM | POA: Diagnosis not present

## 2024-07-13 DIAGNOSIS — E039 Hypothyroidism, unspecified: Secondary | ICD-10-CM | POA: Diagnosis not present

## 2024-07-13 DIAGNOSIS — D509 Iron deficiency anemia, unspecified: Secondary | ICD-10-CM | POA: Diagnosis not present

## 2024-07-13 DIAGNOSIS — I129 Hypertensive chronic kidney disease with stage 1 through stage 4 chronic kidney disease, or unspecified chronic kidney disease: Secondary | ICD-10-CM | POA: Diagnosis not present

## 2024-07-13 DIAGNOSIS — E785 Hyperlipidemia, unspecified: Secondary | ICD-10-CM | POA: Diagnosis not present

## 2024-07-13 DIAGNOSIS — I69391 Dysphagia following cerebral infarction: Secondary | ICD-10-CM | POA: Diagnosis not present

## 2024-07-13 DIAGNOSIS — I69320 Aphasia following cerebral infarction: Secondary | ICD-10-CM | POA: Diagnosis not present

## 2024-07-14 NOTE — Telephone Encounter (Signed)
 Spoke to Mrs. Benjamin Bryan. She says Mr. Streetman is doing well. Epistaxis has stopped. He's still having difficulty walking but otherwise is ok.

## 2024-07-17 DIAGNOSIS — I69391 Dysphagia following cerebral infarction: Secondary | ICD-10-CM | POA: Diagnosis not present

## 2024-07-17 DIAGNOSIS — I951 Orthostatic hypotension: Secondary | ICD-10-CM | POA: Diagnosis not present

## 2024-07-17 DIAGNOSIS — Z86711 Personal history of pulmonary embolism: Secondary | ICD-10-CM | POA: Diagnosis not present

## 2024-07-17 DIAGNOSIS — I129 Hypertensive chronic kidney disease with stage 1 through stage 4 chronic kidney disease, or unspecified chronic kidney disease: Secondary | ICD-10-CM | POA: Diagnosis not present

## 2024-07-17 DIAGNOSIS — E785 Hyperlipidemia, unspecified: Secondary | ICD-10-CM | POA: Diagnosis not present

## 2024-07-17 DIAGNOSIS — N1832 Chronic kidney disease, stage 3b: Secondary | ICD-10-CM | POA: Diagnosis not present

## 2024-07-17 DIAGNOSIS — Z87891 Personal history of nicotine dependence: Secondary | ICD-10-CM | POA: Diagnosis not present

## 2024-07-17 DIAGNOSIS — M159 Polyosteoarthritis, unspecified: Secondary | ICD-10-CM | POA: Diagnosis not present

## 2024-07-17 DIAGNOSIS — Z7902 Long term (current) use of antithrombotics/antiplatelets: Secondary | ICD-10-CM | POA: Diagnosis not present

## 2024-07-17 DIAGNOSIS — I69322 Dysarthria following cerebral infarction: Secondary | ICD-10-CM | POA: Diagnosis not present

## 2024-07-17 DIAGNOSIS — D509 Iron deficiency anemia, unspecified: Secondary | ICD-10-CM | POA: Diagnosis not present

## 2024-07-17 DIAGNOSIS — E039 Hypothyroidism, unspecified: Secondary | ICD-10-CM | POA: Diagnosis not present

## 2024-07-17 DIAGNOSIS — I69320 Aphasia following cerebral infarction: Secondary | ICD-10-CM | POA: Diagnosis not present

## 2024-07-17 DIAGNOSIS — J438 Other emphysema: Secondary | ICD-10-CM | POA: Diagnosis not present

## 2024-07-18 ENCOUNTER — Other Ambulatory Visit: Payer: Self-pay

## 2024-07-18 DIAGNOSIS — I69391 Dysphagia following cerebral infarction: Secondary | ICD-10-CM | POA: Diagnosis not present

## 2024-07-18 DIAGNOSIS — I951 Orthostatic hypotension: Secondary | ICD-10-CM | POA: Diagnosis not present

## 2024-07-18 DIAGNOSIS — E785 Hyperlipidemia, unspecified: Secondary | ICD-10-CM | POA: Diagnosis not present

## 2024-07-18 DIAGNOSIS — N1832 Chronic kidney disease, stage 3b: Secondary | ICD-10-CM | POA: Diagnosis not present

## 2024-07-18 DIAGNOSIS — J438 Other emphysema: Secondary | ICD-10-CM | POA: Diagnosis not present

## 2024-07-18 DIAGNOSIS — I69320 Aphasia following cerebral infarction: Secondary | ICD-10-CM | POA: Diagnosis not present

## 2024-07-18 DIAGNOSIS — D509 Iron deficiency anemia, unspecified: Secondary | ICD-10-CM | POA: Diagnosis not present

## 2024-07-18 DIAGNOSIS — Z86711 Personal history of pulmonary embolism: Secondary | ICD-10-CM | POA: Diagnosis not present

## 2024-07-18 DIAGNOSIS — I129 Hypertensive chronic kidney disease with stage 1 through stage 4 chronic kidney disease, or unspecified chronic kidney disease: Secondary | ICD-10-CM | POA: Diagnosis not present

## 2024-07-18 DIAGNOSIS — E039 Hypothyroidism, unspecified: Secondary | ICD-10-CM | POA: Diagnosis not present

## 2024-07-18 DIAGNOSIS — M159 Polyosteoarthritis, unspecified: Secondary | ICD-10-CM | POA: Diagnosis not present

## 2024-07-18 DIAGNOSIS — Z87891 Personal history of nicotine dependence: Secondary | ICD-10-CM | POA: Diagnosis not present

## 2024-07-18 DIAGNOSIS — Z7902 Long term (current) use of antithrombotics/antiplatelets: Secondary | ICD-10-CM | POA: Diagnosis not present

## 2024-07-18 DIAGNOSIS — I69322 Dysarthria following cerebral infarction: Secondary | ICD-10-CM | POA: Diagnosis not present

## 2024-07-18 NOTE — Transitions of Care (Post Inpatient/ED Visit) (Signed)
 Transition of Care week 4  Visit Note  07/18/2024  Name: Benjamin Bryan MRN: [id number]          DOB: [birth year]  Situation: Patient enrolled in [facility] 30-day program. Visit completed with Week #4 by telephone.   Background: Admit/Discharge Date  7/21 - 7/23 [facility]  Primary Diagnosis: Cerebrovascular accident  Initial Transition Care Management Follow-up Telephone Call    Past Medical History:  Diagnosis Date   Atherosclerosis of aortic bifurcation and common iliac arteries (HCC) 01/26/2018   Seen on xray 01/26/18. Normal ABI study 2018   Cancer [facility])    Basal cell carcinoma 11/18/2020   COPD (chronic obstructive pulmonary disease) (HCC) 10/03/2015   Frequent PVCs 09/11/2015   History of prostate cancer 10/03/2015   Radical prostectomy     History of pulmonary embolus (PE) 10/07/2018   Late October 2019 small clot burden   History of TIA (transient ischemic attack) 09/11/2015   Hyperlipidemia    Hypertension    Hypertensive kidney disease with stage 3 chronic kidney disease (HCC) 09/11/2015   Hypertensive urgency 08/30/2019   Hypothyroidism 09/11/2015   Iron deficiency anemia 12/09/2018   Neuralgia, neuritis, and radiculitis, unspecified 03/20/2014   Neurologic orthostatic hypotension (HCC) 09/12/2014   Shingles    Thyroid  disease     Assessment: Patient Reported Symptoms: Cognitive Cognitive Status: Confused or disoriented, Struggling with memory recall, Requires Assistance Decision Making      Neurological Neurological Review of Symptoms: Weakness Neurological Comment: patient and wife state patient remains weak s/p CVA - continues with PT, OT, ST - wife states OT came yesterday, PT is coming today, ST is coming tomorrow  HEENT HEENT Symptoms Reported: Nosebleed HEENT Self-Management Outcome: 3 (uncertain) HEENT Comment: wife states when she took patient to UC for nose bleed on 8/6 ST was present and states ST applied pressure and nose bleed continued - Wife  states patient had a nose bleed today but wife states it stopped after they applied pressure - encouraged to call TOC RN or PCP with nose bleed and if other symptoms may need to go to ED    Cardiovascular Cardiovascular Symptoms Reported: Dizziness Cardiovascular Comment: wife states he had some dizziness this morning that quickly went away - educated wife to make sure PT checks BP this morning and she stated patient has a BP cuff but she does not know how to use it - educated wife to have PT show her how to use it and to check BP with dizziness/nose bleeds  Respiratory Respiratory Symptoms Reported: No symptoms reported    Endocrine Endocrine Symptoms Reported: No symptoms reported Is patient diabetic?: No Endocrine Comment: Patient has hypothyroidism  Gastrointestinal Gastrointestinal Symptoms Reported: No symptoms reported Additional Gastrointestinal Details: patient states he had a full breakfast this morning - can't remember what he ate - wife states patient had a sausage biscuit and states he's been eating pretty good'      Genitourinary Genitourinary Symptoms Reported: No symptoms reported    Integumentary Integumentary Symptoms Reported: No symptoms reported    Musculoskeletal Musculoskelatal Symptoms Reviewed: Weakness Additional Musculoskeletal Details: Patient reports he continues to do exercises with therapist -  reports still feels weak and uses walker with ambulation        Psychosocial Psychosocial Symptoms Reported: Other Other Psychosocial Conditions: patient states he feels he is doing well emotionally         There were no vitals filed for this visit.  Medications Reviewed Today  Reviewed by Lauro Shona LABOR, RN (Registered Nurse) on 07/18/24 at 1002  Med List Status: <None>   Medication Order Taking? Sig Documenting Provider Last Dose Status Informant  aspirin  EC 81 MG tablet [id number] Yes Take 81 mg by mouth daily. Swallow whole. [provider]   Active   atorvastatin  (LIPITOR) 40 MG tablet [id number] Yes Take 40 mg by mouth daily. [provider]  Active   carvedilol  (COREG ) 6.25 MG tablet [id number] Yes Take 1 tablet (6.25 mg total) by mouth 2 (two) times daily with a meal. Please call [phone] to schedule an overdue appointment for future refills. Thank you. 1st attempt. Crain, Whitney L, PA  Active   famotidine (PEPCID) 20 MG tablet [id number] Yes Take 20 mg by mouth daily.  Patient taking differently: Take 10 mg by mouth daily. Wife reports bottle says 10mg    [provider]  Active Self  ipratropium (ATROVENT ) 0.06 % nasal spray [id number] Yes USE 2 SPRAY(S) IN EACH NOSTRIL EVERY 4 HOURS AS NEEDED Willo Mini, NP  Active   ketoconazole (NIZORAL) 2 % shampoo [id number] Yes Apply topically. [provider]  Active   lansoprazole (PREVACID) 15 MG capsule [id number]  Take 15 mg by mouth daily at 12 noon.  Patient not taking: Reported on 07/18/2024   [provider]  Active   levothyroxine  (SYNTHROID ) 100 MCG tablet [id number] Yes Take 1 tablet (100 mcg total) by mouth daily before breakfast. Alvia Bring, DO  Active   ticagrelor  (BRILINTA ) 90 MG TABS tablet [id number] Yes Take 1 tablet (90 mg total) by mouth 2 (two) times daily. Lowella Benton CROME, [place]  Active             Recommendation:   Continue Current Plan of Care  Follow Up Plan:   Telephone follow up appointment date/time:  [facility] RN scheduled next appointment with wife for 07/25/24 1pm but advised will do a quick follow up call 07/19/24 to see if patient has had any further nose bleed or dizziness   Shona Lauro RN, CCM [place]  VBCI-Population Health RN Care Manager [phone]

## 2024-07-19 ENCOUNTER — Telehealth: Payer: Self-pay

## 2024-07-19 DIAGNOSIS — Z87891 Personal history of nicotine dependence: Secondary | ICD-10-CM | POA: Diagnosis not present

## 2024-07-19 DIAGNOSIS — I69391 Dysphagia following cerebral infarction: Secondary | ICD-10-CM | POA: Diagnosis not present

## 2024-07-19 DIAGNOSIS — J438 Other emphysema: Secondary | ICD-10-CM | POA: Diagnosis not present

## 2024-07-19 DIAGNOSIS — I69320 Aphasia following cerebral infarction: Secondary | ICD-10-CM | POA: Diagnosis not present

## 2024-07-19 DIAGNOSIS — D509 Iron deficiency anemia, unspecified: Secondary | ICD-10-CM | POA: Diagnosis not present

## 2024-07-19 DIAGNOSIS — N1832 Chronic kidney disease, stage 3b: Secondary | ICD-10-CM | POA: Diagnosis not present

## 2024-07-19 DIAGNOSIS — Z86711 Personal history of pulmonary embolism: Secondary | ICD-10-CM | POA: Diagnosis not present

## 2024-07-19 DIAGNOSIS — I69322 Dysarthria following cerebral infarction: Secondary | ICD-10-CM | POA: Diagnosis not present

## 2024-07-19 DIAGNOSIS — Z7902 Long term (current) use of antithrombotics/antiplatelets: Secondary | ICD-10-CM | POA: Diagnosis not present

## 2024-07-19 DIAGNOSIS — I129 Hypertensive chronic kidney disease with stage 1 through stage 4 chronic kidney disease, or unspecified chronic kidney disease: Secondary | ICD-10-CM | POA: Diagnosis not present

## 2024-07-19 DIAGNOSIS — M159 Polyosteoarthritis, unspecified: Secondary | ICD-10-CM | POA: Diagnosis not present

## 2024-07-19 DIAGNOSIS — I951 Orthostatic hypotension: Secondary | ICD-10-CM | POA: Diagnosis not present

## 2024-07-19 DIAGNOSIS — E785 Hyperlipidemia, unspecified: Secondary | ICD-10-CM | POA: Diagnosis not present

## 2024-07-19 DIAGNOSIS — E039 Hypothyroidism, unspecified: Secondary | ICD-10-CM | POA: Diagnosis not present

## 2024-07-19 NOTE — Transitions of Care (Post Inpatient/ED Visit) (Signed)
 07/19/2024 Update 07/19/24 - Quick call to wife who states patient is doing well, has had no further no bleed and denies any issues today and is agreeable to keep scheduled call 07/25/24 1pm and call TOC RN prior to appointment with new issues/concerns  Patient ID: Benjamin Bryan, male   DOB: February 17, 1928, 89 y.o.   MRN: [id number]  Shona Prow RN, CCM [place]  VBCI-Population Health RN Care Manager [phone]

## 2024-07-21 ENCOUNTER — Other Ambulatory Visit (HOSPITAL_COMMUNITY): Payer: Self-pay

## 2024-07-21 ENCOUNTER — Other Ambulatory Visit: Payer: Self-pay

## 2024-07-25 ENCOUNTER — Other Ambulatory Visit: Payer: Self-pay

## 2024-07-25 ENCOUNTER — Other Ambulatory Visit: Payer: Self-pay | Admitting: Urgent Care

## 2024-07-25 DIAGNOSIS — Z86711 Personal history of pulmonary embolism: Secondary | ICD-10-CM | POA: Diagnosis not present

## 2024-07-25 DIAGNOSIS — M159 Polyosteoarthritis, unspecified: Secondary | ICD-10-CM | POA: Diagnosis not present

## 2024-07-25 DIAGNOSIS — Z87891 Personal history of nicotine dependence: Secondary | ICD-10-CM | POA: Diagnosis not present

## 2024-07-25 DIAGNOSIS — E785 Hyperlipidemia, unspecified: Secondary | ICD-10-CM | POA: Diagnosis not present

## 2024-07-25 DIAGNOSIS — I69320 Aphasia following cerebral infarction: Secondary | ICD-10-CM | POA: Diagnosis not present

## 2024-07-25 DIAGNOSIS — I69391 Dysphagia following cerebral infarction: Secondary | ICD-10-CM | POA: Diagnosis not present

## 2024-07-25 DIAGNOSIS — Z7902 Long term (current) use of antithrombotics/antiplatelets: Secondary | ICD-10-CM | POA: Diagnosis not present

## 2024-07-25 DIAGNOSIS — E039 Hypothyroidism, unspecified: Secondary | ICD-10-CM | POA: Diagnosis not present

## 2024-07-25 DIAGNOSIS — I951 Orthostatic hypotension: Secondary | ICD-10-CM | POA: Diagnosis not present

## 2024-07-25 DIAGNOSIS — N1832 Chronic kidney disease, stage 3b: Secondary | ICD-10-CM | POA: Diagnosis not present

## 2024-07-25 DIAGNOSIS — I129 Hypertensive chronic kidney disease with stage 1 through stage 4 chronic kidney disease, or unspecified chronic kidney disease: Secondary | ICD-10-CM | POA: Diagnosis not present

## 2024-07-25 DIAGNOSIS — I69322 Dysarthria following cerebral infarction: Secondary | ICD-10-CM | POA: Diagnosis not present

## 2024-07-25 DIAGNOSIS — D509 Iron deficiency anemia, unspecified: Secondary | ICD-10-CM | POA: Diagnosis not present

## 2024-07-25 DIAGNOSIS — J438 Other emphysema: Secondary | ICD-10-CM | POA: Diagnosis not present

## 2024-07-25 NOTE — Telephone Encounter (Unsigned)
 Copied from CRM [id number]. Topic: Clinical - Medication Refill >> Jul 25, 2024  9:33 AM Alfonso ORN wrote: Medication: atorvastatin  (LIPITOR) 40 MG tablet,carvedilol  (COREG ) 6.25 MG tablet  Has the patient contacted their pharmacy? Yes (Agent: If no, request that the patient contact the pharmacy for the refill. If patient does not wish to contact the pharmacy document the reason why and proceed with request.) (Agent: If yes, when and what did the pharmacy advise?)  This is the patient's preferred pharmacy:  [facility] [address], [place] - [address] [place] [address] Phone: [phone] Fax: [phone]   Is this the correct pharmacy for this prescription? Yes If no, delete pharmacy and type the correct one.   Has the prescription been filled recently? No  Is the patient out of the medication? No almost out of both medication  Has the patient been seen for an appointment in the last year OR does the patient have an upcoming appointment? Yes  Can we respond through MyChart? No  Agent: Please be advised that Rx refills may take up to 3 business days. We ask that you follow-up with your pharmacy.   ----------------------------------------------------------------------- From previous Reason for Contact - Prescription Issue: Reason for CRM: patient's spouse Claxton Levitz would like to speak with Alvia Bring, DO regarding to medications atorvastatin  (LIPITOR) 40 MG tablet and carvedilol  (COREG ) 6.25 MG tablet

## 2024-07-25 NOTE — Transitions of Care (Post Inpatient/ED Visit) (Signed)
 Transition of Care Week 5  Visit Note  07/25/2024  Name: Benjamin Bryan MRN: [id number]          DOB: 1927-12-14  Situation: Patient enrolled in [facility] 30-day program. Visit completed with patient and wife by telephone.   Background: Admit/Discharge Date  7/21 - 7/23 [facility]  Primary Diagnosis: Cerebrovascular accident; unspecified mechanism  Initial Transition Care Management Follow-up Telephone Call    Past Medical History:  Diagnosis Date   Atherosclerosis of aortic bifurcation and common iliac arteries (HCC) 01/26/2018   Seen on xray 01/26/18. Normal ABI study 2018   Cancer [facility])    Basal cell carcinoma 11/18/2020   COPD (chronic obstructive pulmonary disease) (HCC) 10/03/2015   Frequent PVCs 09/11/2015   History of prostate cancer 10/03/2015   Radical prostectomy     History of pulmonary embolus (PE) 10/07/2018   Late October 2019 small clot burden   History of TIA (transient ischemic attack) 09/11/2015   Hyperlipidemia    Hypertension    Hypertensive kidney disease with stage 3 chronic kidney disease (HCC) 09/11/2015   Hypertensive urgency 08/30/2019   Hypothyroidism 09/11/2015   Iron deficiency anemia 12/09/2018   Neuralgia, neuritis, and radiculitis, unspecified 03/20/2014   Neurologic orthostatic hypotension (HCC) 09/12/2014   Shingles    Thyroid  disease     Assessment: Patient Reported Symptoms: Cognitive Cognitive Status: Struggling with memory recall, Requires Assistance Decision Making      Neurological   Neurological Comment: Wife states patient patient had PT today and ST is coming today and OT is coming tomorrow  HEENT HEENT Symptoms Reported: No symptoms reported HEENT Comment: Wife states patient has not had any further nose bleeds - states the therapists check BP and haven't expressed any concern about BP    Cardiovascular Cardiovascular Symptoms Reported: No symptoms reported Cardiovascular Comment: Wife denies any dizziness recently   Respiratory Respiratory Symptoms Reported: No symptoms reported    Endocrine Endocrine Symptoms Reported: No symptoms reported Endocrine Comment: hx hypothyroidism  Gastrointestinal Gastrointestinal Symptoms Reported: No symptoms reported Additional Gastrointestinal Details: wife states patient is eating well and denies any issues today      Genitourinary Genitourinary Symptoms Reported: No symptoms reported    Integumentary Integumentary Symptoms Reported: Bruising Skin Comment: Wife reports brusing to bilateral lower legs  Musculoskeletal Musculoskelatal Symptoms Reviewed: Weakness Additional Musculoskeletal Details: Wife states she can't tell if patient is getting stronger but continues to work with PT/OT and states his speech is good and not good on different days        Psychosocial Psychosocial Symptoms Reported: Not assessed         There were no vitals filed for this visit.  Medications Reviewed Today     Reviewed by Lauro Shona LABOR, RN (Registered Nurse) on 07/25/24 at 1308  Med List Status: <None>   Medication Order Taking? Sig Documenting Provider Last Dose Status Informant  aspirin  EC 81 MG tablet [id number] Yes Take 81 mg by mouth daily. Swallow whole. [provider]  Active   atorvastatin  (LIPITOR) 40 MG tablet [id number] Yes Take 40 mg by mouth daily. [provider]  Active   carvedilol  (COREG ) 6.25 MG tablet [id number] Yes Take 1 tablet (6.25 mg total) by mouth 2 (two) times daily with a meal. Please call [phone] to schedule an overdue appointment for future refills. Thank you. 1st attempt. Crain, Whitney L, PA  Active   famotidine (PEPCID) 20 MG tablet [id number] Yes Take 20 mg by mouth daily.  Patient  taking differently: Take 10 mg by mouth daily. Wife reports bottle says 10mg    [provider]  Active Self  ipratropium (ATROVENT ) 0.06 % nasal spray [id number] Yes USE 2 SPRAY(S) IN EACH NOSTRIL EVERY 4 HOURS AS NEEDED Willo Mini,  NP  Active   ketoconazole (NIZORAL) 2 % shampoo [id number] Yes Apply topically. [provider]  Active   lansoprazole (PREVACID) 15 MG capsule [id number]  Take 15 mg by mouth daily at 12 noon.  Patient not taking: Reported on 07/25/2024   [provider]  Active   levothyroxine  (SYNTHROID ) 100 MCG tablet [id number] Yes Take 1 tablet (100 mcg total) by mouth daily before breakfast. Alvia Bring, DO  Active   ticagrelor  (BRILINTA ) 90 MG TABS tablet [id number] Yes Take 1 tablet (90 mg total) by mouth 2 (two) times daily. Lowella Benton CROME, [place]  Active             Recommendation:   PCP Follow-up 08/11/24 or sooner with any issues/concerns  Follow Up Plan:   Closing From:  Transitions of Care Program  Shona Prow RN, CCM [facility]  VBCI-Population Health RN Care Manager [phone]

## 2024-07-26 DIAGNOSIS — I129 Hypertensive chronic kidney disease with stage 1 through stage 4 chronic kidney disease, or unspecified chronic kidney disease: Secondary | ICD-10-CM | POA: Diagnosis not present

## 2024-07-26 DIAGNOSIS — I69320 Aphasia following cerebral infarction: Secondary | ICD-10-CM | POA: Diagnosis not present

## 2024-07-26 DIAGNOSIS — Z87891 Personal history of nicotine dependence: Secondary | ICD-10-CM | POA: Diagnosis not present

## 2024-07-26 DIAGNOSIS — Z7902 Long term (current) use of antithrombotics/antiplatelets: Secondary | ICD-10-CM | POA: Diagnosis not present

## 2024-07-26 DIAGNOSIS — Z86711 Personal history of pulmonary embolism: Secondary | ICD-10-CM | POA: Diagnosis not present

## 2024-07-26 DIAGNOSIS — N1832 Chronic kidney disease, stage 3b: Secondary | ICD-10-CM | POA: Diagnosis not present

## 2024-07-26 DIAGNOSIS — D509 Iron deficiency anemia, unspecified: Secondary | ICD-10-CM | POA: Diagnosis not present

## 2024-07-26 DIAGNOSIS — I69391 Dysphagia following cerebral infarction: Secondary | ICD-10-CM | POA: Diagnosis not present

## 2024-07-26 DIAGNOSIS — M159 Polyosteoarthritis, unspecified: Secondary | ICD-10-CM | POA: Diagnosis not present

## 2024-07-26 DIAGNOSIS — J438 Other emphysema: Secondary | ICD-10-CM | POA: Diagnosis not present

## 2024-07-26 DIAGNOSIS — E785 Hyperlipidemia, unspecified: Secondary | ICD-10-CM | POA: Diagnosis not present

## 2024-07-26 DIAGNOSIS — E039 Hypothyroidism, unspecified: Secondary | ICD-10-CM | POA: Diagnosis not present

## 2024-07-26 DIAGNOSIS — I951 Orthostatic hypotension: Secondary | ICD-10-CM | POA: Diagnosis not present

## 2024-07-26 DIAGNOSIS — I69322 Dysarthria following cerebral infarction: Secondary | ICD-10-CM | POA: Diagnosis not present

## 2024-07-28 MED ORDER — ATORVASTATIN CALCIUM 40 MG PO TABS: 40.0000 mg | ORAL_TABLET | Freq: Every day | ORAL | 1 refills | Status: AC

## 2024-07-30 DIAGNOSIS — I129 Hypertensive chronic kidney disease with stage 1 through stage 4 chronic kidney disease, or unspecified chronic kidney disease: Secondary | ICD-10-CM | POA: Diagnosis not present

## 2024-07-30 DIAGNOSIS — J449 Chronic obstructive pulmonary disease, unspecified: Secondary | ICD-10-CM | POA: Diagnosis not present

## 2024-07-30 DIAGNOSIS — E785 Hyperlipidemia, unspecified: Secondary | ICD-10-CM | POA: Diagnosis not present

## 2024-07-30 DIAGNOSIS — Z87891 Personal history of nicotine dependence: Secondary | ICD-10-CM | POA: Diagnosis not present

## 2024-07-30 DIAGNOSIS — Z7901 Long term (current) use of anticoagulants: Secondary | ICD-10-CM | POA: Diagnosis not present

## 2024-07-30 DIAGNOSIS — N183 Chronic kidney disease, stage 3 unspecified: Secondary | ICD-10-CM | POA: Diagnosis not present

## 2024-07-30 DIAGNOSIS — I10 Essential (primary) hypertension: Secondary | ICD-10-CM | POA: Diagnosis not present

## 2024-07-30 DIAGNOSIS — R040 Epistaxis: Secondary | ICD-10-CM | POA: Diagnosis not present

## 2024-07-31 ENCOUNTER — Other Ambulatory Visit (HOSPITAL_COMMUNITY): Payer: Self-pay

## 2024-07-31 ENCOUNTER — Telehealth: Payer: Self-pay

## 2024-07-31 NOTE — Telephone Encounter (Signed)
 Copied from CRM [id number]. Topic: Clinical - Medication Refill >> Jul 25, 2024  9:33 AM Alfonso ORN wrote: Medication: atorvastatin  (LIPITOR) 40 MG tablet,carvedilol  (COREG ) 6.25 MG tablet  Has the patient contacted their pharmacy? Yes (Agent: If no, request that the patient contact the pharmacy for the refill. If patient does not wish to contact the pharmacy document the reason why and proceed with request.) (Agent: If yes, when and what did the pharmacy advise?)  This is the patient's preferred pharmacy:  [facility] [address], [place] - [address] [place] [address] Phone: [phone] Fax: [phone]   Is this the correct pharmacy for this prescription? Yes If no, delete pharmacy and type the correct one.   Has the prescription been filled recently? No  Is the patient out of the medication? No almost out of both medication  Has the patient been seen for an appointment in the last year OR does the patient have an upcoming appointment? Yes  Can we respond through MyChart? No  Agent: Please be advised that Rx refills may take up to 3 business days. We ask that you follow-up with your pharmacy.   ----------------------------------------------------------------------- From previous Reason for Contact - Prescription Issue: Reason for CRM: patient's spouse Claxton Levitz would like to speak with Alvia Bring, DO regarding to medications atorvastatin  (LIPITOR) 40 MG tablet and carvedilol  (COREG ) 6.25 MG tablet

## 2024-07-31 NOTE — Telephone Encounter (Signed)
 Spoke with patient's wife informed her that Atrovastatin refill was sent on 07/28/2024 to [facility] [address]  And the refill of carvedilol  6.25mg  was sent to [facility]  08/05/225. She will await for the pharmacy to open this morning and will check with them regarding this.

## 2024-08-02 DIAGNOSIS — D509 Iron deficiency anemia, unspecified: Secondary | ICD-10-CM | POA: Diagnosis not present

## 2024-08-02 DIAGNOSIS — E039 Hypothyroidism, unspecified: Secondary | ICD-10-CM | POA: Diagnosis not present

## 2024-08-02 DIAGNOSIS — I69322 Dysarthria following cerebral infarction: Secondary | ICD-10-CM | POA: Diagnosis not present

## 2024-08-02 DIAGNOSIS — J438 Other emphysema: Secondary | ICD-10-CM | POA: Diagnosis not present

## 2024-08-02 DIAGNOSIS — I69320 Aphasia following cerebral infarction: Secondary | ICD-10-CM | POA: Diagnosis not present

## 2024-08-02 DIAGNOSIS — Z86711 Personal history of pulmonary embolism: Secondary | ICD-10-CM | POA: Diagnosis not present

## 2024-08-02 DIAGNOSIS — E785 Hyperlipidemia, unspecified: Secondary | ICD-10-CM | POA: Diagnosis not present

## 2024-08-02 DIAGNOSIS — I129 Hypertensive chronic kidney disease with stage 1 through stage 4 chronic kidney disease, or unspecified chronic kidney disease: Secondary | ICD-10-CM | POA: Diagnosis not present

## 2024-08-02 DIAGNOSIS — N1832 Chronic kidney disease, stage 3b: Secondary | ICD-10-CM | POA: Diagnosis not present

## 2024-08-02 DIAGNOSIS — M159 Polyosteoarthritis, unspecified: Secondary | ICD-10-CM | POA: Diagnosis not present

## 2024-08-02 DIAGNOSIS — I69391 Dysphagia following cerebral infarction: Secondary | ICD-10-CM | POA: Diagnosis not present

## 2024-08-02 DIAGNOSIS — Z7902 Long term (current) use of antithrombotics/antiplatelets: Secondary | ICD-10-CM | POA: Diagnosis not present

## 2024-08-02 DIAGNOSIS — I951 Orthostatic hypotension: Secondary | ICD-10-CM | POA: Diagnosis not present

## 2024-08-02 DIAGNOSIS — Z87891 Personal history of nicotine dependence: Secondary | ICD-10-CM | POA: Diagnosis not present

## 2024-08-03 DIAGNOSIS — Z87891 Personal history of nicotine dependence: Secondary | ICD-10-CM | POA: Diagnosis not present

## 2024-08-03 DIAGNOSIS — I69391 Dysphagia following cerebral infarction: Secondary | ICD-10-CM | POA: Diagnosis not present

## 2024-08-03 DIAGNOSIS — E785 Hyperlipidemia, unspecified: Secondary | ICD-10-CM | POA: Diagnosis not present

## 2024-08-03 DIAGNOSIS — I69320 Aphasia following cerebral infarction: Secondary | ICD-10-CM | POA: Diagnosis not present

## 2024-08-03 DIAGNOSIS — I129 Hypertensive chronic kidney disease with stage 1 through stage 4 chronic kidney disease, or unspecified chronic kidney disease: Secondary | ICD-10-CM | POA: Diagnosis not present

## 2024-08-03 DIAGNOSIS — I951 Orthostatic hypotension: Secondary | ICD-10-CM | POA: Diagnosis not present

## 2024-08-03 DIAGNOSIS — J438 Other emphysema: Secondary | ICD-10-CM | POA: Diagnosis not present

## 2024-08-03 DIAGNOSIS — N1832 Chronic kidney disease, stage 3b: Secondary | ICD-10-CM | POA: Diagnosis not present

## 2024-08-03 DIAGNOSIS — D509 Iron deficiency anemia, unspecified: Secondary | ICD-10-CM | POA: Diagnosis not present

## 2024-08-03 DIAGNOSIS — E039 Hypothyroidism, unspecified: Secondary | ICD-10-CM | POA: Diagnosis not present

## 2024-08-03 DIAGNOSIS — Z7902 Long term (current) use of antithrombotics/antiplatelets: Secondary | ICD-10-CM | POA: Diagnosis not present

## 2024-08-03 DIAGNOSIS — I69322 Dysarthria following cerebral infarction: Secondary | ICD-10-CM | POA: Diagnosis not present

## 2024-08-03 DIAGNOSIS — Z86711 Personal history of pulmonary embolism: Secondary | ICD-10-CM | POA: Diagnosis not present

## 2024-08-03 DIAGNOSIS — M159 Polyosteoarthritis, unspecified: Secondary | ICD-10-CM | POA: Diagnosis not present

## 2024-08-04 DIAGNOSIS — E039 Hypothyroidism, unspecified: Secondary | ICD-10-CM | POA: Diagnosis not present

## 2024-08-04 DIAGNOSIS — I69322 Dysarthria following cerebral infarction: Secondary | ICD-10-CM | POA: Diagnosis not present

## 2024-08-04 DIAGNOSIS — Z7902 Long term (current) use of antithrombotics/antiplatelets: Secondary | ICD-10-CM | POA: Diagnosis not present

## 2024-08-04 DIAGNOSIS — Z86711 Personal history of pulmonary embolism: Secondary | ICD-10-CM | POA: Diagnosis not present

## 2024-08-04 DIAGNOSIS — I69391 Dysphagia following cerebral infarction: Secondary | ICD-10-CM | POA: Diagnosis not present

## 2024-08-04 DIAGNOSIS — I951 Orthostatic hypotension: Secondary | ICD-10-CM | POA: Diagnosis not present

## 2024-08-04 DIAGNOSIS — E785 Hyperlipidemia, unspecified: Secondary | ICD-10-CM | POA: Diagnosis not present

## 2024-08-04 DIAGNOSIS — J438 Other emphysema: Secondary | ICD-10-CM | POA: Diagnosis not present

## 2024-08-04 DIAGNOSIS — I69320 Aphasia following cerebral infarction: Secondary | ICD-10-CM | POA: Diagnosis not present

## 2024-08-04 DIAGNOSIS — Z87891 Personal history of nicotine dependence: Secondary | ICD-10-CM | POA: Diagnosis not present

## 2024-08-04 DIAGNOSIS — N1832 Chronic kidney disease, stage 3b: Secondary | ICD-10-CM | POA: Diagnosis not present

## 2024-08-04 DIAGNOSIS — M159 Polyosteoarthritis, unspecified: Secondary | ICD-10-CM | POA: Diagnosis not present

## 2024-08-04 DIAGNOSIS — D509 Iron deficiency anemia, unspecified: Secondary | ICD-10-CM | POA: Diagnosis not present

## 2024-08-04 DIAGNOSIS — I129 Hypertensive chronic kidney disease with stage 1 through stage 4 chronic kidney disease, or unspecified chronic kidney disease: Secondary | ICD-10-CM | POA: Diagnosis not present

## 2024-08-10 ENCOUNTER — Telehealth: Payer: Self-pay

## 2024-08-10 DIAGNOSIS — E785 Hyperlipidemia, unspecified: Secondary | ICD-10-CM | POA: Diagnosis not present

## 2024-08-10 DIAGNOSIS — Z86711 Personal history of pulmonary embolism: Secondary | ICD-10-CM | POA: Diagnosis not present

## 2024-08-10 DIAGNOSIS — Z7902 Long term (current) use of antithrombotics/antiplatelets: Secondary | ICD-10-CM | POA: Diagnosis not present

## 2024-08-10 DIAGNOSIS — M159 Polyosteoarthritis, unspecified: Secondary | ICD-10-CM | POA: Diagnosis not present

## 2024-08-10 DIAGNOSIS — D509 Iron deficiency anemia, unspecified: Secondary | ICD-10-CM | POA: Diagnosis not present

## 2024-08-10 DIAGNOSIS — J438 Other emphysema: Secondary | ICD-10-CM | POA: Diagnosis not present

## 2024-08-10 DIAGNOSIS — I951 Orthostatic hypotension: Secondary | ICD-10-CM | POA: Diagnosis not present

## 2024-08-10 DIAGNOSIS — I69320 Aphasia following cerebral infarction: Secondary | ICD-10-CM | POA: Diagnosis not present

## 2024-08-10 DIAGNOSIS — I69391 Dysphagia following cerebral infarction: Secondary | ICD-10-CM | POA: Diagnosis not present

## 2024-08-10 DIAGNOSIS — I129 Hypertensive chronic kidney disease with stage 1 through stage 4 chronic kidney disease, or unspecified chronic kidney disease: Secondary | ICD-10-CM | POA: Diagnosis not present

## 2024-08-10 DIAGNOSIS — N1832 Chronic kidney disease, stage 3b: Secondary | ICD-10-CM | POA: Diagnosis not present

## 2024-08-10 DIAGNOSIS — Z87891 Personal history of nicotine dependence: Secondary | ICD-10-CM | POA: Diagnosis not present

## 2024-08-10 DIAGNOSIS — E039 Hypothyroidism, unspecified: Secondary | ICD-10-CM | POA: Diagnosis not present

## 2024-08-10 DIAGNOSIS — I69322 Dysarthria following cerebral infarction: Secondary | ICD-10-CM | POA: Diagnosis not present

## 2024-08-10 NOTE — Telephone Encounter (Signed)
 Copied from CRM [id number]. Topic: Clinical - Medical Advice >> Aug 10, 2024 10:19 AM Laurier BROCKS wrote: Reason for CRM:  Channing from [facility] called to express concerns and see if the provider would complete a urine sample be completed at the appointment tomorrow due to the patient experiencing increased confusion.

## 2024-08-11 ENCOUNTER — Encounter: Payer: Self-pay | Admitting: Family Medicine

## 2024-08-11 ENCOUNTER — Ambulatory Visit (INDEPENDENT_AMBULATORY_CARE_PROVIDER_SITE_OTHER): Admitting: Family Medicine

## 2024-08-11 VITALS — BP 163/76 | HR 65 | Ht 65.0 in | Wt 120.0 lb

## 2024-08-11 DIAGNOSIS — I10 Essential (primary) hypertension: Secondary | ICD-10-CM

## 2024-08-11 DIAGNOSIS — I639 Cerebral infarction, unspecified: Secondary | ICD-10-CM

## 2024-08-11 DIAGNOSIS — R410 Disorientation, unspecified: Secondary | ICD-10-CM | POA: Diagnosis not present

## 2024-08-11 MED ORDER — CEPHALEXIN 500 MG PO CAPS: 500.0000 mg | ORAL_CAPSULE | Freq: Three times a day (TID) | ORAL | 0 refills | Status: AC

## 2024-08-11 NOTE — Patient Instructions (Addendum)
 Stop Ticagrelor  on 10/31.  Continue only aspirin  after that period.  Continue all other medications.  There should be refills on file at the pharmacy.

## 2024-08-12 LAB — CBC WITH DIFFERENTIAL/PLATELET
Basophils Absolute: 0.1 x10E3/uL (ref 0.0–0.2)
Basos: 1 %
EOS (ABSOLUTE): 0.1 x10E3/uL (ref 0.0–0.4)
Eos: 1 %
Hematocrit: 41.6 % (ref 37.5–51.0)
Hemoglobin: 13.4 g/dL (ref 13.0–17.7)
Immature Grans (Abs): 0 x10E3/uL (ref 0.0–0.1)
Immature Granulocytes: 0 %
Lymphocytes Absolute: 1 x10E3/uL (ref 0.7–3.1)
Lymphs: 11 %
MCH: 31.5 pg (ref 26.6–33.0)
MCHC: 32.2 g/dL (ref 31.5–35.7)
MCV: 98 fL — ABNORMAL HIGH (ref 79–97)
Monocytes Absolute: 1.1 x10E3/uL — ABNORMAL HIGH (ref 0.1–0.9)
Monocytes: 12 %
Neutrophils Absolute: 6.7 x10E3/uL (ref 1.4–7.0)
Neutrophils: 75 %
Platelets: 314 x10E3/uL (ref 150–450)
RBC: 4.25 x10E6/uL (ref 4.14–5.80)
RDW: 12.8 % (ref 11.6–15.4)
WBC: 8.9 x10E3/uL (ref 3.4–10.8)

## 2024-08-12 LAB — CMP14+EGFR
ALT: 16 IU/L (ref 0–44)
AST: 19 IU/L (ref 0–40)
Albumin: 3.9 g/dL (ref 3.6–4.6)
Alkaline Phosphatase: 88 IU/L (ref 44–121)
BUN/Creatinine Ratio: 15 (ref 10–24)
BUN: 25 mg/dL (ref 10–36)
Bilirubin Total: 1 mg/dL (ref 0.0–1.2)
CO2: 18 mmol/L — ABNORMAL LOW (ref 20–29)
Calcium: 9.3 mg/dL (ref 8.6–10.2)
Chloride: 98 mmol/L (ref 96–106)
Creatinine, Ser: 1.64 mg/dL — ABNORMAL HIGH (ref 0.76–1.27)
Globulin, Total: 2.6 g/dL (ref 1.5–4.5)
Glucose: 79 mg/dL (ref 70–99)
Potassium: 4.3 mmol/L (ref 3.5–5.2)
Sodium: 135 mmol/L (ref 134–144)
Total Protein: 6.5 g/dL (ref 6.0–8.5)
eGFR: 39 mL/min/1.73 — ABNORMAL LOW (ref >59)

## 2024-08-13 DIAGNOSIS — R410 Disorientation, unspecified: Secondary | ICD-10-CM | POA: Insufficient documentation

## 2024-08-13 NOTE — Assessment & Plan Note (Signed)
 He will continue DAPT until the end of October and then 81 mg aspirin  daily.  Continue atorvastatin  at current strength.  Continue home health services for rehab.

## 2024-08-13 NOTE — Assessment & Plan Note (Signed)
 Slightly more confused than baseline.  Unable to give urine specimen today.  Given specimen cup and hat to collect urine specimen and return this to clinic.  Given current symptoms we will go ahead and cover empirically with Keflex  going into the weekend.

## 2024-08-13 NOTE — Progress Notes (Signed)
 Benjamin Bryan - 89 y.o. male MRN [id number]  Date of birth: 1928/03/01  Subjective Chief Complaint  Patient presents with   Altered Mental Status   Dizziness    HPI Benjamin Bryan is a 89 y.o. male here today for follow-up visit.  Recently hospitalized for CVA.  Discharged with Brilinta  and aspirin .  He will continue DAPT for 90 days and then just aspirin .  He has been doing some home physical therapy.  He also has home nurse.  His wife and home nurse have noticed that he is a little more confused over the past day or 2.  Has had some urinary frequency.  Denies pain with urination.  No fever or chills.  He denies dyspnea or chest pain.  Appetite has been okay.  No nausea or vomiting.  Bowels are moving normally.  ROS:  A comprehensive ROS was completed and negative except as noted per HPI  No Known Allergies  Past Medical History:  Diagnosis Date   Atherosclerosis of aortic bifurcation and common iliac arteries (HCC) 01/26/2018   Seen on xray 01/26/18. Normal ABI study 2018   Cancer [facility])    Basal cell carcinoma 11/18/2020   COPD (chronic obstructive pulmonary disease) (HCC) 10/03/2015   Frequent PVCs 09/11/2015   History of prostate cancer 10/03/2015   Radical prostectomy     History of pulmonary embolus (PE) 10/07/2018   Late October 2019 small clot burden   History of TIA (transient ischemic attack) 09/11/2015   Hyperlipidemia    Hypertension    Hypertensive kidney disease with stage 3 chronic kidney disease (HCC) 09/11/2015   Hypertensive urgency 08/30/2019   Hypothyroidism 09/11/2015   Iron deficiency anemia 12/09/2018   Neuralgia, neuritis, and radiculitis, unspecified 03/20/2014   Neurologic orthostatic hypotension (HCC) 09/12/2014   Shingles    Thyroid  disease     Past Surgical History:  Procedure Laterality Date   radical prostectomy  1994   removed section of intestine  08/24/2005   polyp   SKIN CANCER EXCISION Left 11/18/2020   from the patient; done at Dr. Vernon  office; december 2021; removed from left arm and neck    Social History   Socioeconomic History   Marital status: Married    Spouse name: Cy   Number of children: 1   Years of education: 12   Highest education level: 12th grade  Occupational History   Occupation: retired    Comment: management  Tobacco Use   Smoking status: Former    Current packs/day: 0.00    Types: Cigarettes    Quit date: 12/11/1984    Years since quitting: 39.6   Smokeless tobacco: Never  Vaping Use   Vaping status: Never Used  Substance and Sexual Activity   Alcohol use: Not Currently   Drug use: No   Sexual activity: Not Currently    Partners: Female  Other Topics Concern   Not on file  Social History Narrative   Lives with his wife. He has one child, who lives at [facility]. He does have a niece who lives close by. He enjoys eating and sleeping.   Social Drivers of Corporate investment banker Strain: Low Risk  (01/08/2023)   Overall Financial Resource Strain (CARDIA)    Difficulty of Paying Living Expenses: Not hard at all  Food Insecurity: No Food Insecurity (06/29/2024)   Hunger Vital Sign    Worried About Running Out of Food in the Last Year: Never true    Ran Out  of Food in the Last Year: Never true  Transportation Needs: No Transportation Needs (06/29/2024)   PRAPARE - Administrator, Civil Service (Medical): No    Lack of Transportation (Non-Medical): No  Physical Activity: Inactive (01/08/2023)   Exercise Vital Sign    Days of Exercise per Week: 0 days    Minutes of Exercise per Session: 0 min  Stress: No Stress Concern Present (06/27/2024)   Received from [facility] of Occupational Health - Occupational Stress Questionnaire    Do you feel stress - tense, restless, nervous, or anxious, or unable to sleep at night because your mind is troubled all the time - these days?: Not at all  Social Connections: Unknown (06/23/2023)   Received from [facility]    Social Network    Social Network: Not on file    Family History  Problem Relation Age of Onset   Prostate cancer Father    Stroke Father     Health Maintenance  Topic Date Due   Medicare Annual Wellness (AWV)  01/09/2024   Influenza Vaccine  07/07/2024   COVID-19 Vaccine (3 - Moderna risk series) 01/20/2025 (Originally 09/07/2020)   DTaP/Tdap/Td (2 - Td or Tdap) 01/27/2028   Pneumococcal Vaccine: 50+ Years  Completed   Zoster Vaccines- Shingrix  Completed   HPV VACCINES  Aged Out   Meningococcal B Vaccine  Aged Out     ----------------------------------------------------------------------------------------------------------------------------------------------------------------------------------------------------------------- Physical Exam BP (!) 163/76 (BP Location: Left Arm, Patient Position: Bed low/side rails up, Cuff Size: Normal)   Pulse 65   Ht 5' 5 (1.651 m)   Wt 120 lb (54.4 kg)   SpO2 100%   BMI 19.97 kg/m   Physical Exam Constitutional:      Appearance: Normal appearance.  HENT:     Head: Normocephalic and atraumatic.  Cardiovascular:     Rate and Rhythm: Normal rate and regular rhythm.  Pulmonary:     Effort: Pulmonary effort is normal.     Breath sounds: Normal breath sounds.  Musculoskeletal:     Cervical back: Neck supple.  Neurological:     General: No focal deficit present.     Mental Status: He is alert.  Psychiatric:        Mood and Affect: Mood normal.        Behavior: Behavior normal.     ------------------------------------------------------------------------------------------------------------------------------------------------------------------------------------------------------------------- Assessment and Plan  Essential hypertension Blood pressure is elevated today.  He is taking medications as directed.  Blood pressures at home have been better controlled.  He will return in 2 to 3 weeks for blood pressure  recheck.  Cerebrovascular accident (CVA) [facility]) He will continue DAPT until the end of October and then 81 mg aspirin  daily.  Continue atorvastatin  at current strength.  Continue home health services for rehab.  Disorientation Slightly more confused than baseline.  Unable to give urine specimen today.  Given specimen cup and hat to collect urine specimen and return this to clinic.  Given current symptoms we will go ahead and cover empirically with Keflex  going into the weekend.   Meds ordered this encounter  Medications   cephALEXin  (KEFLEX ) 500 MG capsule    Sig: Take 1 capsule (500 mg total) by mouth 3 (three) times daily for 7 days.    Dispense:  21 capsule    Refill:  0    Return in about 3 weeks (around 09/01/2024) for f/u CVA.

## 2024-08-13 NOTE — Assessment & Plan Note (Signed)
 Blood pressure is elevated today.  He is taking medications as directed.  Blood pressures at home have been better controlled.  He will return in 2 to 3 weeks for blood pressure recheck.

## 2024-08-14 LAB — POCT URINALYSIS DIP (CLINITEK)
Bilirubin, UA: NEGATIVE
Blood, UA: NEGATIVE
Glucose, UA: NEGATIVE mg/dL
Ketones, POC UA: NEGATIVE mg/dL
Leukocytes, UA: NEGATIVE
Nitrite, UA: NEGATIVE
POC PROTEIN,UA: NEGATIVE
Spec Grav, UA: 1.015 (ref 1.010–1.025)
Urobilinogen, UA: 1 U/dL
pH, UA: 7 (ref 5.0–8.0)

## 2024-08-14 NOTE — Addendum Note (Signed)
 Addended by: Lanitra Battaglini Z on: 08/14/2024 01:30 PM   Modules accepted: Orders

## 2024-08-15 DIAGNOSIS — Z86711 Personal history of pulmonary embolism: Secondary | ICD-10-CM | POA: Diagnosis not present

## 2024-08-15 DIAGNOSIS — I69322 Dysarthria following cerebral infarction: Secondary | ICD-10-CM | POA: Diagnosis not present

## 2024-08-15 DIAGNOSIS — Z7902 Long term (current) use of antithrombotics/antiplatelets: Secondary | ICD-10-CM | POA: Diagnosis not present

## 2024-08-15 DIAGNOSIS — I69320 Aphasia following cerebral infarction: Secondary | ICD-10-CM | POA: Diagnosis not present

## 2024-08-15 DIAGNOSIS — E785 Hyperlipidemia, unspecified: Secondary | ICD-10-CM | POA: Diagnosis not present

## 2024-08-15 DIAGNOSIS — I129 Hypertensive chronic kidney disease with stage 1 through stage 4 chronic kidney disease, or unspecified chronic kidney disease: Secondary | ICD-10-CM | POA: Diagnosis not present

## 2024-08-15 DIAGNOSIS — M159 Polyosteoarthritis, unspecified: Secondary | ICD-10-CM | POA: Diagnosis not present

## 2024-08-15 DIAGNOSIS — D509 Iron deficiency anemia, unspecified: Secondary | ICD-10-CM | POA: Diagnosis not present

## 2024-08-15 DIAGNOSIS — E039 Hypothyroidism, unspecified: Secondary | ICD-10-CM | POA: Diagnosis not present

## 2024-08-15 DIAGNOSIS — N1832 Chronic kidney disease, stage 3b: Secondary | ICD-10-CM | POA: Diagnosis not present

## 2024-08-15 DIAGNOSIS — I69391 Dysphagia following cerebral infarction: Secondary | ICD-10-CM | POA: Diagnosis not present

## 2024-08-15 DIAGNOSIS — I951 Orthostatic hypotension: Secondary | ICD-10-CM | POA: Diagnosis not present

## 2024-08-15 DIAGNOSIS — Z87891 Personal history of nicotine dependence: Secondary | ICD-10-CM | POA: Diagnosis not present

## 2024-08-15 DIAGNOSIS — J438 Other emphysema: Secondary | ICD-10-CM | POA: Diagnosis not present

## 2024-08-16 ENCOUNTER — Telehealth: Payer: Self-pay

## 2024-08-16 DIAGNOSIS — I69320 Aphasia following cerebral infarction: Secondary | ICD-10-CM | POA: Diagnosis not present

## 2024-08-16 DIAGNOSIS — I69322 Dysarthria following cerebral infarction: Secondary | ICD-10-CM | POA: Diagnosis not present

## 2024-08-16 DIAGNOSIS — Z87891 Personal history of nicotine dependence: Secondary | ICD-10-CM | POA: Diagnosis not present

## 2024-08-16 DIAGNOSIS — I69391 Dysphagia following cerebral infarction: Secondary | ICD-10-CM | POA: Diagnosis not present

## 2024-08-16 DIAGNOSIS — I951 Orthostatic hypotension: Secondary | ICD-10-CM | POA: Diagnosis not present

## 2024-08-16 DIAGNOSIS — E785 Hyperlipidemia, unspecified: Secondary | ICD-10-CM | POA: Diagnosis not present

## 2024-08-16 DIAGNOSIS — J438 Other emphysema: Secondary | ICD-10-CM | POA: Diagnosis not present

## 2024-08-16 DIAGNOSIS — Z86711 Personal history of pulmonary embolism: Secondary | ICD-10-CM | POA: Diagnosis not present

## 2024-08-16 DIAGNOSIS — D509 Iron deficiency anemia, unspecified: Secondary | ICD-10-CM | POA: Diagnosis not present

## 2024-08-16 DIAGNOSIS — M159 Polyosteoarthritis, unspecified: Secondary | ICD-10-CM | POA: Diagnosis not present

## 2024-08-16 DIAGNOSIS — N1832 Chronic kidney disease, stage 3b: Secondary | ICD-10-CM | POA: Diagnosis not present

## 2024-08-16 DIAGNOSIS — E039 Hypothyroidism, unspecified: Secondary | ICD-10-CM | POA: Diagnosis not present

## 2024-08-16 DIAGNOSIS — Z7902 Long term (current) use of antithrombotics/antiplatelets: Secondary | ICD-10-CM | POA: Diagnosis not present

## 2024-08-16 DIAGNOSIS — I129 Hypertensive chronic kidney disease with stage 1 through stage 4 chronic kidney disease, or unspecified chronic kidney disease: Secondary | ICD-10-CM | POA: Diagnosis not present

## 2024-08-16 NOTE — Telephone Encounter (Signed)
 Copied from CRM [id number]. Topic: Clinical - Home Health Verbal Orders >> Aug 16, 2024  9:56 AM Diannia DEL wrote: Caller/Agency: [facility] Callback Number: [phone] Service Requested: Skilled Nursing Frequency: 1 week 1 Any new concerns about the patient? No

## 2024-08-17 ENCOUNTER — Telehealth: Payer: Self-pay

## 2024-08-17 DIAGNOSIS — M159 Polyosteoarthritis, unspecified: Secondary | ICD-10-CM | POA: Diagnosis not present

## 2024-08-17 DIAGNOSIS — N1832 Chronic kidney disease, stage 3b: Secondary | ICD-10-CM | POA: Diagnosis not present

## 2024-08-17 DIAGNOSIS — Z86711 Personal history of pulmonary embolism: Secondary | ICD-10-CM | POA: Diagnosis not present

## 2024-08-17 DIAGNOSIS — I69320 Aphasia following cerebral infarction: Secondary | ICD-10-CM | POA: Diagnosis not present

## 2024-08-17 DIAGNOSIS — E785 Hyperlipidemia, unspecified: Secondary | ICD-10-CM | POA: Diagnosis not present

## 2024-08-17 DIAGNOSIS — I69322 Dysarthria following cerebral infarction: Secondary | ICD-10-CM | POA: Diagnosis not present

## 2024-08-17 DIAGNOSIS — Z87891 Personal history of nicotine dependence: Secondary | ICD-10-CM | POA: Diagnosis not present

## 2024-08-17 DIAGNOSIS — E039 Hypothyroidism, unspecified: Secondary | ICD-10-CM | POA: Diagnosis not present

## 2024-08-17 DIAGNOSIS — I69391 Dysphagia following cerebral infarction: Secondary | ICD-10-CM | POA: Diagnosis not present

## 2024-08-17 DIAGNOSIS — I951 Orthostatic hypotension: Secondary | ICD-10-CM | POA: Diagnosis not present

## 2024-08-17 DIAGNOSIS — J438 Other emphysema: Secondary | ICD-10-CM | POA: Diagnosis not present

## 2024-08-17 DIAGNOSIS — I129 Hypertensive chronic kidney disease with stage 1 through stage 4 chronic kidney disease, or unspecified chronic kidney disease: Secondary | ICD-10-CM | POA: Diagnosis not present

## 2024-08-17 DIAGNOSIS — Z7902 Long term (current) use of antithrombotics/antiplatelets: Secondary | ICD-10-CM | POA: Diagnosis not present

## 2024-08-17 DIAGNOSIS — D509 Iron deficiency anemia, unspecified: Secondary | ICD-10-CM | POA: Diagnosis not present

## 2024-08-17 NOTE — Telephone Encounter (Signed)
 Copied from CRM [id number]. Topic: Clinical - Lab/Test Results >> Aug 16, 2024 10:43 AM Carrielelia G wrote: Please call patients wife regarding patient [facility] lab results.

## 2024-08-17 NOTE — Telephone Encounter (Signed)
Results not yet reviewed by provider

## 2024-08-17 NOTE — Telephone Encounter (Signed)
 Verbal orders approval given

## 2024-08-21 DIAGNOSIS — M159 Polyosteoarthritis, unspecified: Secondary | ICD-10-CM | POA: Diagnosis not present

## 2024-08-21 DIAGNOSIS — I951 Orthostatic hypotension: Secondary | ICD-10-CM | POA: Diagnosis not present

## 2024-08-21 DIAGNOSIS — I69322 Dysarthria following cerebral infarction: Secondary | ICD-10-CM | POA: Diagnosis not present

## 2024-08-21 DIAGNOSIS — Z87891 Personal history of nicotine dependence: Secondary | ICD-10-CM | POA: Diagnosis not present

## 2024-08-21 DIAGNOSIS — N1832 Chronic kidney disease, stage 3b: Secondary | ICD-10-CM | POA: Diagnosis not present

## 2024-08-21 DIAGNOSIS — I69320 Aphasia following cerebral infarction: Secondary | ICD-10-CM | POA: Diagnosis not present

## 2024-08-21 DIAGNOSIS — I129 Hypertensive chronic kidney disease with stage 1 through stage 4 chronic kidney disease, or unspecified chronic kidney disease: Secondary | ICD-10-CM | POA: Diagnosis not present

## 2024-08-21 DIAGNOSIS — Z86711 Personal history of pulmonary embolism: Secondary | ICD-10-CM | POA: Diagnosis not present

## 2024-08-21 DIAGNOSIS — J438 Other emphysema: Secondary | ICD-10-CM | POA: Diagnosis not present

## 2024-08-21 DIAGNOSIS — D509 Iron deficiency anemia, unspecified: Secondary | ICD-10-CM | POA: Diagnosis not present

## 2024-08-21 DIAGNOSIS — I69391 Dysphagia following cerebral infarction: Secondary | ICD-10-CM | POA: Diagnosis not present

## 2024-08-21 DIAGNOSIS — E785 Hyperlipidemia, unspecified: Secondary | ICD-10-CM | POA: Diagnosis not present

## 2024-08-21 DIAGNOSIS — E039 Hypothyroidism, unspecified: Secondary | ICD-10-CM | POA: Diagnosis not present

## 2024-08-21 DIAGNOSIS — Z7902 Long term (current) use of antithrombotics/antiplatelets: Secondary | ICD-10-CM | POA: Diagnosis not present

## 2024-08-24 DIAGNOSIS — Z87891 Personal history of nicotine dependence: Secondary | ICD-10-CM | POA: Diagnosis not present

## 2024-08-24 DIAGNOSIS — Z7902 Long term (current) use of antithrombotics/antiplatelets: Secondary | ICD-10-CM | POA: Diagnosis not present

## 2024-08-24 DIAGNOSIS — N183 Chronic kidney disease, stage 3 unspecified: Secondary | ICD-10-CM | POA: Diagnosis not present

## 2024-08-24 DIAGNOSIS — I129 Hypertensive chronic kidney disease with stage 1 through stage 4 chronic kidney disease, or unspecified chronic kidney disease: Secondary | ICD-10-CM | POA: Diagnosis not present

## 2024-08-24 DIAGNOSIS — R58 Hemorrhage, not elsewhere classified: Secondary | ICD-10-CM | POA: Diagnosis not present

## 2024-08-24 DIAGNOSIS — J438 Other emphysema: Secondary | ICD-10-CM | POA: Diagnosis not present

## 2024-08-24 DIAGNOSIS — Z86711 Personal history of pulmonary embolism: Secondary | ICD-10-CM | POA: Diagnosis not present

## 2024-08-24 DIAGNOSIS — I69391 Dysphagia following cerebral infarction: Secondary | ICD-10-CM | POA: Diagnosis not present

## 2024-08-24 DIAGNOSIS — E039 Hypothyroidism, unspecified: Secondary | ICD-10-CM | POA: Diagnosis not present

## 2024-08-24 DIAGNOSIS — I69320 Aphasia following cerebral infarction: Secondary | ICD-10-CM | POA: Diagnosis not present

## 2024-08-24 DIAGNOSIS — J449 Chronic obstructive pulmonary disease, unspecified: Secondary | ICD-10-CM | POA: Diagnosis not present

## 2024-08-24 DIAGNOSIS — I951 Orthostatic hypotension: Secondary | ICD-10-CM | POA: Diagnosis not present

## 2024-08-24 DIAGNOSIS — Z79899 Other long term (current) drug therapy: Secondary | ICD-10-CM | POA: Diagnosis not present

## 2024-08-24 DIAGNOSIS — W19XXXA Unspecified fall, initial encounter: Secondary | ICD-10-CM | POA: Diagnosis not present

## 2024-08-24 DIAGNOSIS — N1832 Chronic kidney disease, stage 3b: Secondary | ICD-10-CM | POA: Diagnosis not present

## 2024-08-24 DIAGNOSIS — E785 Hyperlipidemia, unspecified: Secondary | ICD-10-CM | POA: Diagnosis not present

## 2024-08-24 DIAGNOSIS — M159 Polyosteoarthritis, unspecified: Secondary | ICD-10-CM | POA: Diagnosis not present

## 2024-08-24 DIAGNOSIS — I69322 Dysarthria following cerebral infarction: Secondary | ICD-10-CM | POA: Diagnosis not present

## 2024-08-24 DIAGNOSIS — R040 Epistaxis: Secondary | ICD-10-CM | POA: Diagnosis not present

## 2024-08-24 DIAGNOSIS — I10 Essential (primary) hypertension: Secondary | ICD-10-CM | POA: Diagnosis not present

## 2024-08-24 DIAGNOSIS — D509 Iron deficiency anemia, unspecified: Secondary | ICD-10-CM | POA: Diagnosis not present

## 2024-08-25 DIAGNOSIS — J438 Other emphysema: Secondary | ICD-10-CM | POA: Diagnosis not present

## 2024-08-25 DIAGNOSIS — I69391 Dysphagia following cerebral infarction: Secondary | ICD-10-CM | POA: Diagnosis not present

## 2024-08-25 DIAGNOSIS — Z87891 Personal history of nicotine dependence: Secondary | ICD-10-CM | POA: Diagnosis not present

## 2024-08-25 DIAGNOSIS — N1832 Chronic kidney disease, stage 3b: Secondary | ICD-10-CM | POA: Diagnosis not present

## 2024-08-25 DIAGNOSIS — Z7902 Long term (current) use of antithrombotics/antiplatelets: Secondary | ICD-10-CM | POA: Diagnosis not present

## 2024-08-25 DIAGNOSIS — E039 Hypothyroidism, unspecified: Secondary | ICD-10-CM | POA: Diagnosis not present

## 2024-08-25 DIAGNOSIS — M159 Polyosteoarthritis, unspecified: Secondary | ICD-10-CM | POA: Diagnosis not present

## 2024-08-25 DIAGNOSIS — I69320 Aphasia following cerebral infarction: Secondary | ICD-10-CM | POA: Diagnosis not present

## 2024-08-25 DIAGNOSIS — Z86711 Personal history of pulmonary embolism: Secondary | ICD-10-CM | POA: Diagnosis not present

## 2024-08-25 DIAGNOSIS — I129 Hypertensive chronic kidney disease with stage 1 through stage 4 chronic kidney disease, or unspecified chronic kidney disease: Secondary | ICD-10-CM | POA: Diagnosis not present

## 2024-08-25 DIAGNOSIS — I951 Orthostatic hypotension: Secondary | ICD-10-CM | POA: Diagnosis not present

## 2024-08-25 DIAGNOSIS — E785 Hyperlipidemia, unspecified: Secondary | ICD-10-CM | POA: Diagnosis not present

## 2024-08-25 DIAGNOSIS — D509 Iron deficiency anemia, unspecified: Secondary | ICD-10-CM | POA: Diagnosis not present

## 2024-08-25 DIAGNOSIS — I69322 Dysarthria following cerebral infarction: Secondary | ICD-10-CM | POA: Diagnosis not present

## 2024-08-28 ENCOUNTER — Telehealth: Payer: Self-pay

## 2024-08-28 DIAGNOSIS — E785 Hyperlipidemia, unspecified: Secondary | ICD-10-CM | POA: Diagnosis not present

## 2024-08-28 DIAGNOSIS — I951 Orthostatic hypotension: Secondary | ICD-10-CM | POA: Diagnosis not present

## 2024-08-28 DIAGNOSIS — M159 Polyosteoarthritis, unspecified: Secondary | ICD-10-CM | POA: Diagnosis not present

## 2024-08-28 DIAGNOSIS — I69322 Dysarthria following cerebral infarction: Secondary | ICD-10-CM | POA: Diagnosis not present

## 2024-08-28 DIAGNOSIS — Z86711 Personal history of pulmonary embolism: Secondary | ICD-10-CM | POA: Diagnosis not present

## 2024-08-28 DIAGNOSIS — E039 Hypothyroidism, unspecified: Secondary | ICD-10-CM | POA: Diagnosis not present

## 2024-08-28 DIAGNOSIS — I129 Hypertensive chronic kidney disease with stage 1 through stage 4 chronic kidney disease, or unspecified chronic kidney disease: Secondary | ICD-10-CM | POA: Diagnosis not present

## 2024-08-28 DIAGNOSIS — D509 Iron deficiency anemia, unspecified: Secondary | ICD-10-CM | POA: Diagnosis not present

## 2024-08-28 DIAGNOSIS — J438 Other emphysema: Secondary | ICD-10-CM | POA: Diagnosis not present

## 2024-08-28 DIAGNOSIS — Z87891 Personal history of nicotine dependence: Secondary | ICD-10-CM | POA: Diagnosis not present

## 2024-08-28 DIAGNOSIS — Z7902 Long term (current) use of antithrombotics/antiplatelets: Secondary | ICD-10-CM | POA: Diagnosis not present

## 2024-08-28 DIAGNOSIS — N1832 Chronic kidney disease, stage 3b: Secondary | ICD-10-CM | POA: Diagnosis not present

## 2024-08-28 DIAGNOSIS — I69391 Dysphagia following cerebral infarction: Secondary | ICD-10-CM | POA: Diagnosis not present

## 2024-08-28 DIAGNOSIS — I69320 Aphasia following cerebral infarction: Secondary | ICD-10-CM | POA: Diagnosis not present

## 2024-08-28 NOTE — Telephone Encounter (Signed)
 Copied from CRM [id number]. Topic: Clinical - Medical Advice >> Aug 28, 2024 12:14 PM Kevelyn M wrote: Reason for CRM: Ric is discharging Benjamin Bryan today with goals met and maximum potential.

## 2024-09-01 ENCOUNTER — Ambulatory Visit (INDEPENDENT_AMBULATORY_CARE_PROVIDER_SITE_OTHER): Admitting: Family Medicine

## 2024-09-01 ENCOUNTER — Encounter: Payer: Self-pay | Admitting: Family Medicine

## 2024-09-01 VITALS — BP 164/71 | HR 63 | Ht 65.0 in | Wt 126.0 lb

## 2024-09-01 DIAGNOSIS — R040 Epistaxis: Secondary | ICD-10-CM

## 2024-09-01 DIAGNOSIS — Z23 Encounter for immunization: Secondary | ICD-10-CM | POA: Diagnosis not present

## 2024-09-01 MED ORDER — MUPIROCIN 2 % EX OINT: TOPICAL_OINTMENT | CUTANEOUS | 3 refills | Status: AC

## 2024-09-01 MED ORDER — SALINE SPRAY 0.65 % NA SOLN: 1.0000 | NASAL | 3 refills | Status: AC | PRN

## 2024-09-01 NOTE — Patient Instructions (Addendum)
 Stop ipratropium nasal spray.   Start nasal saline.   Use mupirocin  ointment twice daily for 1 week.

## 2024-09-04 DIAGNOSIS — R040 Epistaxis: Secondary | ICD-10-CM | POA: Insufficient documentation

## 2024-09-04 NOTE — Assessment & Plan Note (Signed)
 He has had a couple episodes of epistaxis.  May continue Afrin as needed.  Adding nasal saline and mupirocin  ointment.  We may have to consider shortening his DAPT if he continues to have recurrent episodes.

## 2024-09-04 NOTE — Progress Notes (Signed)
 Benjamin Bryan - 89 y.o. male MRN [id number]  Date of birth: 02/21/1928  Subjective Chief Complaint  Patient presents with   Follow-up   Fall    HPI Benjamin Bryan is a 89 y.o. male here today for follow-up visit.  Recently seen in the ED with complaint of nosebleed.  He has had a couple nosebleeds recently that have been difficult to manage at home.  He was started on Plavix after his stroke a few weeks ago.  Currently on dual antiplatelet therapy with Plavix and aspirin .  He will be on this for another few weeks and then transition to aspirin  alone.  Last nosebleed was able to be managed with Afrin.  He denies any sinus pain, headaches.  ROS:  A comprehensive ROS was completed and negative except as noted per HPI  No Known Allergies  Past Medical History:  Diagnosis Date   Atherosclerosis of aortic bifurcation and common iliac arteries 01/26/2018   Seen on xray 01/26/18. Normal ABI study 2018   Cancer [facility])    Basal cell carcinoma 11/18/2020   COPD (chronic obstructive pulmonary disease) (HCC) 10/03/2015   Frequent PVCs 09/11/2015   History of prostate cancer 10/03/2015   Radical prostectomy     History of pulmonary embolus (PE) 10/07/2018   Late October 2019 small clot burden   History of TIA (transient ischemic attack) 09/11/2015   Hyperlipidemia    Hypertension    Hypertensive kidney disease with stage 3 chronic kidney disease (HCC) 09/11/2015   Hypertensive urgency 08/30/2019   Hypothyroidism 09/11/2015   Iron deficiency anemia 12/09/2018   Neuralgia, neuritis, and radiculitis, unspecified 03/20/2014   Neurologic orthostatic hypotension (HCC) 09/12/2014   Shingles    Thyroid  disease     Past Surgical History:  Procedure Laterality Date   radical prostectomy  1994   removed section of intestine  08/24/2005   polyp   SKIN CANCER EXCISION Left 11/18/2020   from the patient; done at Dr. Vernon office; december 2021; removed from left arm and neck    Social History    Socioeconomic History   Marital status: Married    Spouse name: Cy   Number of children: 1   Years of education: 12   Highest education level: 12th grade  Occupational History   Occupation: retired    Comment: management  Tobacco Use   Smoking status: Former    Current packs/day: 0.00    Types: Cigarettes    Quit date: 12/11/1984    Years since quitting: 39.7   Smokeless tobacco: Never  Vaping Use   Vaping status: Never Used  Substance and Sexual Activity   Alcohol use: Not Currently   Drug use: No   Sexual activity: Not Currently    Partners: Female  Other Topics Concern   Not on file  Social History Narrative   Lives with his wife. He has one child, who lives at [facility]. He does have a niece who lives close by. He enjoys eating and sleeping.   Social Drivers of Corporate investment banker Strain: Low Risk  (01/08/2023)   Overall Financial Resource Strain (CARDIA)    Difficulty of Paying Living Expenses: Not hard at all  Food Insecurity: No Food Insecurity (06/29/2024)   Hunger Vital Sign    Worried About Running Out of Food in the Last Year: Never true    Ran Out of Food in the Last Year: Never true  Transportation Needs: No Transportation Needs (06/29/2024)   PRAPARE -  Administrator, Civil Service (Medical): No    Lack of Transportation (Non-Medical): No  Physical Activity: Inactive (01/08/2023)   Exercise Vital Sign    Days of Exercise per Week: 0 days    Minutes of Exercise per Session: 0 min  Stress: No Stress Concern Present (06/27/2024)   Received from [facility] of Occupational Health - Occupational Stress Questionnaire    Do you feel stress - tense, restless, nervous, or anxious, or unable to sleep at night because your mind is troubled all the time - these days?: Not at all  Social Connections: Unknown (06/23/2023)   Received from [facility]   Social Network    Social Network: Not on file    Family History   Problem Relation Age of Onset   Prostate cancer Father    Stroke Father     Health Maintenance  Topic Date Due   Medicare Annual Wellness (AWV)  01/09/2024   COVID-19 Vaccine (3 - Moderna risk series) 01/20/2025 (Originally 09/07/2020)   DTaP/Tdap/Td (2 - Td or Tdap) 01/27/2028   Pneumococcal Vaccine: 50+ Years  Completed   Influenza Vaccine  Completed   Zoster Vaccines- Shingrix  Completed   HPV VACCINES  Aged Out   Meningococcal B Vaccine  Aged Out     ----------------------------------------------------------------------------------------------------------------------------------------------------------------------------------------------------------------- Physical Exam BP (!) 164/71 (BP Location: Left Arm, Patient Position: Sitting, Cuff Size: Small)   Pulse 63   Ht 5' 5 (1.651 m)   Wt 126 lb (57.2 kg)   SpO2 99%   BMI 20.97 kg/m   Physical Exam Constitutional:      Appearance: Normal appearance.  HENT:     Head: Normocephalic and atraumatic.     Nose:     Comments: Mild erythema within the nares. Eyes:     General: No scleral icterus. Cardiovascular:     Rate and Rhythm: Normal rate and regular rhythm.  Pulmonary:     Effort: Pulmonary effort is normal.     Breath sounds: Normal breath sounds.  Musculoskeletal:     Cervical back: Neck supple.  Neurological:     Mental Status: He is alert.  Psychiatric:        Mood and Affect: Mood normal.        Behavior: Behavior normal.     ------------------------------------------------------------------------------------------------------------------------------------------------------------------------------------------------------------------- Assessment and Plan  Epistaxis He has had a couple episodes of epistaxis.  May continue Afrin as needed.  Adding nasal saline and mupirocin  ointment.  We may have to consider shortening his DAPT if he continues to have recurrent episodes.   Meds ordered this encounter   Medications   mupirocin  ointment (BACTROBAN ) 2 %    Sig: Use in the nose twice per day x7 days    Dispense:  30 g    Refill:  3   sodium chloride  (OCEAN) 0.65 % SOLN nasal spray    Sig: Place 1 spray into both nostrils as needed for congestion.    Dispense:  44 mL    Refill:  3    No follow-ups on file.

## 2024-09-08 ENCOUNTER — Telehealth: Payer: Self-pay

## 2024-09-08 NOTE — Telephone Encounter (Signed)
 Copied from CRM [id number]. Topic: General - Other >> Sep 08, 2024 10:16 AM Kevelyn M wrote: Reason for CRM: House call visit-His mini cognative test was abnormal. Nurse is making Provider aware.

## 2024-09-14 ENCOUNTER — Other Ambulatory Visit (HOSPITAL_COMMUNITY): Payer: Self-pay

## 2024-10-23 ENCOUNTER — Other Ambulatory Visit: Payer: Self-pay | Admitting: Family Medicine

## 2024-10-23 ENCOUNTER — Other Ambulatory Visit (HOSPITAL_COMMUNITY): Payer: Self-pay

## 2024-10-23 DIAGNOSIS — E079 Disorder of thyroid, unspecified: Secondary | ICD-10-CM

## 2024-10-23 MED ORDER — LEVOTHYROXINE SODIUM 100 MCG PO TABS: 100.0000 ug | ORAL_TABLET | Freq: Every day | ORAL | 1 refills | Status: AC

## 2024-10-23 NOTE — Telephone Encounter (Signed)
 Copied from CRM [id number]. Topic: Clinical - Medication Refill >> Oct 23, 2024  9:06 AM Gustabo D wrote: Medication: levothyroxine  (SYNTHROID ) 100 MCG tablet  Has the patient contacted their pharmacy? No (Agent: If no, request that the patient contact the pharmacy for the refill. If patient does not wish to contact the pharmacy document the reason why and proceed with request.) (Agent: If yes, when and what did the pharmacy advise?)  This is the patient's preferred pharmacy:  [facility] [address] [place] - [address] [place] [address] Phone: [phone] Fax: [phone]    Is this the correct pharmacy for this prescription? Yes If no, delete pharmacy and type the correct one.   Has the prescription been filled recently? No  Is the patient out of the medication? No almost he has 3 left  Has the patient been seen for an appointment in the last year OR does the patient have an upcoming appointment? Yes  Can we respond through MyChart? No  Agent: Please be advised that Rx refills may take up to 3 business days. We ask that you follow-up with your pharmacy. >> Oct 23, 2024  9:08 AM Gustabo D wrote: Pt wife wants to know if pcp wants pt to take this? He is out of refills
